# Patient Record
Sex: Female | Born: 1945
Health system: Southern US, Community
[De-identification: ages and names within clinical notes are randomized; demographics above are authoritative.]

## PROBLEM LIST (undated history)

## (undated) DIAGNOSIS — D573 Sickle-cell trait: Secondary | ICD-10-CM

## (undated) DIAGNOSIS — I10 Essential (primary) hypertension: Secondary | ICD-10-CM

## (undated) DIAGNOSIS — N183 Chronic kidney disease, stage 3 unspecified: Secondary | ICD-10-CM

## (undated) DIAGNOSIS — K5090 Crohn's disease, unspecified, without complications: Secondary | ICD-10-CM

## (undated) DIAGNOSIS — B20 Human immunodeficiency virus [HIV] disease: Secondary | ICD-10-CM

## (undated) DIAGNOSIS — Z21 Asymptomatic human immunodeficiency virus [HIV] infection status: Secondary | ICD-10-CM

## (undated) HISTORY — DX: Chronic kidney disease, stage 3 unspecified: N18.30

## (undated) HISTORY — DX: Human immunodeficiency virus (HIV) disease: B20

## (undated) HISTORY — DX: Crohn's disease, unspecified, without complications: K50.90

## (undated) HISTORY — DX: Essential (primary) hypertension: I10

## (undated) HISTORY — PX: COLON SURGERY: SHX602

## (undated) HISTORY — DX: Sickle-cell trait: D57.3

## (undated) HISTORY — DX: Asymptomatic human immunodeficiency virus (hiv) infection status: Z21

## (undated) HISTORY — DX: Chronic kidney disease, stage 3 (moderate): N18.3

---

## 1997-07-12 ENCOUNTER — Encounter: Payer: Self-pay | Admitting: Gastroenterology

## 1997-12-21 ENCOUNTER — Encounter: Admission: RE | Admit: 1997-12-21 | Discharge: 1997-12-21 | Payer: Self-pay | Admitting: Infectious Diseases

## 1997-12-21 ENCOUNTER — Encounter (INDEPENDENT_AMBULATORY_CARE_PROVIDER_SITE_OTHER): Payer: Self-pay | Admitting: *Deleted

## 1998-01-04 ENCOUNTER — Encounter: Admission: RE | Admit: 1998-01-04 | Discharge: 1998-01-04 | Payer: Self-pay | Admitting: Infectious Diseases

## 1998-02-08 ENCOUNTER — Encounter: Admission: RE | Admit: 1998-02-08 | Discharge: 1998-02-08 | Payer: Self-pay | Admitting: Obstetrics and Gynecology

## 1998-02-22 ENCOUNTER — Encounter: Admission: RE | Admit: 1998-02-22 | Discharge: 1998-02-22 | Payer: Self-pay | Admitting: Internal Medicine

## 1998-03-25 ENCOUNTER — Encounter: Admission: RE | Admit: 1998-03-25 | Discharge: 1998-03-25 | Payer: Self-pay | Admitting: Infectious Diseases

## 1998-04-25 ENCOUNTER — Encounter: Admission: RE | Admit: 1998-04-25 | Discharge: 1998-04-25 | Payer: Self-pay | Admitting: Infectious Diseases

## 1998-06-26 ENCOUNTER — Encounter: Admission: RE | Admit: 1998-06-26 | Discharge: 1998-06-26 | Payer: Self-pay | Admitting: Infectious Diseases

## 1998-07-12 ENCOUNTER — Encounter: Admission: RE | Admit: 1998-07-12 | Discharge: 1998-07-12 | Payer: Self-pay | Admitting: Infectious Diseases

## 1998-09-26 ENCOUNTER — Ambulatory Visit (HOSPITAL_COMMUNITY): Admission: RE | Admit: 1998-09-26 | Discharge: 1998-09-26 | Payer: Self-pay | Admitting: Infectious Diseases

## 1998-10-10 ENCOUNTER — Other Ambulatory Visit: Admission: RE | Admit: 1998-10-10 | Discharge: 1998-10-10 | Payer: Self-pay | Admitting: Infectious Diseases

## 1998-10-10 ENCOUNTER — Encounter: Admission: RE | Admit: 1998-10-10 | Discharge: 1998-10-10 | Payer: Self-pay | Admitting: Infectious Diseases

## 1998-11-04 ENCOUNTER — Other Ambulatory Visit: Admission: RE | Admit: 1998-11-04 | Discharge: 1998-11-04 | Payer: Self-pay | Admitting: *Deleted

## 1998-12-12 ENCOUNTER — Ambulatory Visit (HOSPITAL_COMMUNITY): Admission: RE | Admit: 1998-12-12 | Discharge: 1998-12-12 | Payer: Self-pay | Admitting: *Deleted

## 1999-01-09 ENCOUNTER — Encounter: Admission: RE | Admit: 1999-01-09 | Discharge: 1999-01-09 | Payer: Self-pay | Admitting: Infectious Diseases

## 1999-01-09 ENCOUNTER — Ambulatory Visit (HOSPITAL_COMMUNITY): Admission: RE | Admit: 1999-01-09 | Discharge: 1999-01-09 | Payer: Self-pay | Admitting: Infectious Diseases

## 1999-01-23 ENCOUNTER — Encounter: Admission: RE | Admit: 1999-01-23 | Discharge: 1999-01-23 | Payer: Self-pay | Admitting: Infectious Diseases

## 1999-02-04 ENCOUNTER — Ambulatory Visit: Admission: RE | Admit: 1999-02-04 | Discharge: 1999-02-04 | Payer: Self-pay | Admitting: *Deleted

## 1999-04-23 ENCOUNTER — Encounter: Admission: RE | Admit: 1999-04-23 | Discharge: 1999-04-23 | Payer: Self-pay | Admitting: Infectious Diseases

## 1999-04-23 ENCOUNTER — Ambulatory Visit (HOSPITAL_COMMUNITY): Admission: RE | Admit: 1999-04-23 | Discharge: 1999-04-23 | Payer: Self-pay | Admitting: Infectious Diseases

## 1999-05-08 ENCOUNTER — Encounter: Admission: RE | Admit: 1999-05-08 | Discharge: 1999-05-08 | Payer: Self-pay | Admitting: Infectious Diseases

## 1999-07-17 ENCOUNTER — Encounter: Admission: RE | Admit: 1999-07-17 | Discharge: 1999-07-17 | Payer: Self-pay | Admitting: Infectious Diseases

## 1999-07-17 ENCOUNTER — Ambulatory Visit (HOSPITAL_COMMUNITY): Admission: RE | Admit: 1999-07-17 | Discharge: 1999-07-17 | Payer: Self-pay | Admitting: Infectious Diseases

## 1999-07-31 ENCOUNTER — Encounter: Admission: RE | Admit: 1999-07-31 | Discharge: 1999-07-31 | Payer: Self-pay | Admitting: Infectious Diseases

## 1999-10-20 ENCOUNTER — Ambulatory Visit (HOSPITAL_COMMUNITY): Admission: RE | Admit: 1999-10-20 | Discharge: 1999-10-20 | Payer: Self-pay | Admitting: Infectious Diseases

## 1999-10-20 ENCOUNTER — Encounter: Admission: RE | Admit: 1999-10-20 | Discharge: 1999-10-20 | Payer: Self-pay | Admitting: Infectious Diseases

## 1999-11-06 ENCOUNTER — Encounter: Admission: RE | Admit: 1999-11-06 | Discharge: 1999-11-06 | Payer: Self-pay | Admitting: Infectious Diseases

## 2000-01-22 ENCOUNTER — Ambulatory Visit (HOSPITAL_COMMUNITY): Admission: RE | Admit: 2000-01-22 | Discharge: 2000-01-22 | Payer: Self-pay | Admitting: Infectious Diseases

## 2000-01-22 ENCOUNTER — Encounter: Admission: RE | Admit: 2000-01-22 | Discharge: 2000-01-22 | Payer: Self-pay | Admitting: Infectious Diseases

## 2000-02-05 ENCOUNTER — Encounter: Admission: RE | Admit: 2000-02-05 | Discharge: 2000-02-05 | Payer: Self-pay | Admitting: Infectious Diseases

## 2000-04-21 ENCOUNTER — Encounter: Admission: RE | Admit: 2000-04-21 | Discharge: 2000-04-21 | Payer: Self-pay | Admitting: Infectious Diseases

## 2000-04-21 ENCOUNTER — Ambulatory Visit (HOSPITAL_COMMUNITY): Admission: RE | Admit: 2000-04-21 | Discharge: 2000-04-21 | Payer: Self-pay | Admitting: Infectious Diseases

## 2000-05-06 ENCOUNTER — Encounter: Admission: RE | Admit: 2000-05-06 | Discharge: 2000-05-06 | Payer: Self-pay | Admitting: Infectious Diseases

## 2000-09-09 ENCOUNTER — Encounter: Admission: RE | Admit: 2000-09-09 | Discharge: 2000-09-09 | Payer: Self-pay | Admitting: Infectious Diseases

## 2000-09-09 ENCOUNTER — Ambulatory Visit (HOSPITAL_COMMUNITY): Admission: RE | Admit: 2000-09-09 | Discharge: 2000-09-09 | Payer: Self-pay | Admitting: Infectious Diseases

## 2000-09-23 ENCOUNTER — Encounter: Admission: RE | Admit: 2000-09-23 | Discharge: 2000-09-23 | Payer: Self-pay | Admitting: Infectious Diseases

## 2000-10-08 ENCOUNTER — Encounter: Admission: RE | Admit: 2000-10-08 | Discharge: 2000-10-08 | Payer: Self-pay

## 2001-01-20 ENCOUNTER — Ambulatory Visit (HOSPITAL_COMMUNITY): Admission: RE | Admit: 2001-01-20 | Discharge: 2001-01-20 | Payer: Self-pay | Admitting: Infectious Diseases

## 2001-01-20 ENCOUNTER — Encounter: Admission: RE | Admit: 2001-01-20 | Discharge: 2001-01-20 | Payer: Self-pay | Admitting: Infectious Diseases

## 2001-02-10 ENCOUNTER — Encounter: Admission: RE | Admit: 2001-02-10 | Discharge: 2001-02-10 | Payer: Self-pay | Admitting: Infectious Diseases

## 2001-04-12 ENCOUNTER — Ambulatory Visit (HOSPITAL_COMMUNITY): Admission: RE | Admit: 2001-04-12 | Discharge: 2001-04-12 | Payer: Self-pay | Admitting: Internal Medicine

## 2001-04-12 ENCOUNTER — Encounter: Admission: RE | Admit: 2001-04-12 | Discharge: 2001-04-12 | Payer: Self-pay | Admitting: Infectious Diseases

## 2001-05-03 ENCOUNTER — Other Ambulatory Visit: Admission: RE | Admit: 2001-05-03 | Discharge: 2001-05-03 | Payer: Self-pay | Admitting: Internal Medicine

## 2001-05-05 ENCOUNTER — Encounter: Admission: RE | Admit: 2001-05-05 | Discharge: 2001-05-05 | Payer: Self-pay | Admitting: Infectious Diseases

## 2001-08-24 ENCOUNTER — Encounter: Admission: RE | Admit: 2001-08-24 | Discharge: 2001-08-24 | Payer: Self-pay | Admitting: Internal Medicine

## 2001-08-24 ENCOUNTER — Ambulatory Visit (HOSPITAL_COMMUNITY): Admission: RE | Admit: 2001-08-24 | Discharge: 2001-08-24 | Payer: Self-pay | Admitting: Infectious Diseases

## 2001-09-08 ENCOUNTER — Encounter: Admission: RE | Admit: 2001-09-08 | Discharge: 2001-09-08 | Payer: Self-pay | Admitting: Infectious Diseases

## 2001-10-25 ENCOUNTER — Encounter (INDEPENDENT_AMBULATORY_CARE_PROVIDER_SITE_OTHER): Payer: Self-pay | Admitting: *Deleted

## 2001-10-25 ENCOUNTER — Other Ambulatory Visit: Admission: RE | Admit: 2001-10-25 | Discharge: 2001-10-25 | Payer: Self-pay | Admitting: Obstetrics and Gynecology

## 2001-10-25 LAB — CONVERTED CEMR LAB

## 2001-11-22 ENCOUNTER — Ambulatory Visit: Admission: RE | Admit: 2001-11-22 | Discharge: 2001-11-22 | Payer: Self-pay | Admitting: Gynecologic Oncology

## 2002-01-16 ENCOUNTER — Ambulatory Visit (HOSPITAL_COMMUNITY): Admission: RE | Admit: 2002-01-16 | Discharge: 2002-01-16 | Payer: Self-pay | Admitting: Infectious Diseases

## 2002-01-16 ENCOUNTER — Encounter: Admission: RE | Admit: 2002-01-16 | Discharge: 2002-01-16 | Payer: Self-pay | Admitting: Infectious Diseases

## 2002-01-23 ENCOUNTER — Other Ambulatory Visit: Admission: RE | Admit: 2002-01-23 | Discharge: 2002-01-23 | Payer: Self-pay | Admitting: Obstetrics and Gynecology

## 2002-01-23 ENCOUNTER — Encounter (INDEPENDENT_AMBULATORY_CARE_PROVIDER_SITE_OTHER): Payer: Self-pay | Admitting: *Deleted

## 2002-01-23 LAB — CONVERTED CEMR LAB

## 2002-02-02 ENCOUNTER — Encounter: Admission: RE | Admit: 2002-02-02 | Discharge: 2002-02-02 | Payer: Self-pay | Admitting: Infectious Diseases

## 2002-03-03 ENCOUNTER — Encounter: Admission: RE | Admit: 2002-03-03 | Discharge: 2002-03-03 | Payer: Self-pay | Admitting: Infectious Diseases

## 2002-04-14 ENCOUNTER — Ambulatory Visit (HOSPITAL_COMMUNITY): Admission: RE | Admit: 2002-04-14 | Discharge: 2002-04-14 | Payer: Self-pay | Admitting: Infectious Diseases

## 2002-04-14 ENCOUNTER — Encounter: Admission: RE | Admit: 2002-04-14 | Discharge: 2002-04-14 | Payer: Self-pay | Admitting: Infectious Diseases

## 2002-05-04 ENCOUNTER — Encounter: Admission: RE | Admit: 2002-05-04 | Discharge: 2002-05-04 | Payer: Self-pay | Admitting: Infectious Diseases

## 2002-07-03 ENCOUNTER — Encounter: Admission: RE | Admit: 2002-07-03 | Discharge: 2002-07-03 | Payer: Self-pay | Admitting: Infectious Diseases

## 2002-07-06 ENCOUNTER — Encounter: Admission: RE | Admit: 2002-07-06 | Discharge: 2002-07-06 | Payer: Self-pay | Admitting: Infectious Diseases

## 2002-07-06 ENCOUNTER — Ambulatory Visit (HOSPITAL_COMMUNITY): Admission: RE | Admit: 2002-07-06 | Discharge: 2002-07-06 | Payer: Self-pay | Admitting: Infectious Diseases

## 2002-07-27 ENCOUNTER — Encounter: Admission: RE | Admit: 2002-07-27 | Discharge: 2002-07-27 | Payer: Self-pay | Admitting: Infectious Diseases

## 2002-07-31 ENCOUNTER — Other Ambulatory Visit: Admission: RE | Admit: 2002-07-31 | Discharge: 2002-07-31 | Payer: Self-pay | Admitting: Obstetrics and Gynecology

## 2002-07-31 ENCOUNTER — Encounter (INDEPENDENT_AMBULATORY_CARE_PROVIDER_SITE_OTHER): Payer: Self-pay | Admitting: *Deleted

## 2002-07-31 LAB — CONVERTED CEMR LAB

## 2002-10-19 ENCOUNTER — Encounter: Payer: Self-pay | Admitting: Infectious Diseases

## 2002-10-19 ENCOUNTER — Encounter: Admission: RE | Admit: 2002-10-19 | Discharge: 2002-10-19 | Payer: Self-pay | Admitting: Infectious Diseases

## 2002-11-02 ENCOUNTER — Encounter: Admission: RE | Admit: 2002-11-02 | Discharge: 2002-11-02 | Payer: Self-pay | Admitting: Infectious Diseases

## 2002-11-16 ENCOUNTER — Encounter: Payer: Self-pay | Admitting: Infectious Diseases

## 2002-11-16 ENCOUNTER — Encounter: Admission: RE | Admit: 2002-11-16 | Discharge: 2002-11-16 | Payer: Self-pay | Admitting: Infectious Diseases

## 2003-02-12 ENCOUNTER — Ambulatory Visit (HOSPITAL_COMMUNITY): Admission: RE | Admit: 2003-02-12 | Discharge: 2003-02-12 | Payer: Self-pay | Admitting: Infectious Diseases

## 2003-02-12 ENCOUNTER — Encounter: Payer: Self-pay | Admitting: Infectious Diseases

## 2003-02-12 ENCOUNTER — Encounter: Admission: RE | Admit: 2003-02-12 | Discharge: 2003-02-12 | Payer: Self-pay | Admitting: Infectious Diseases

## 2003-02-13 ENCOUNTER — Other Ambulatory Visit: Admission: RE | Admit: 2003-02-13 | Discharge: 2003-02-13 | Payer: Self-pay | Admitting: Physician Assistant

## 2003-02-13 ENCOUNTER — Encounter (INDEPENDENT_AMBULATORY_CARE_PROVIDER_SITE_OTHER): Payer: Self-pay | Admitting: *Deleted

## 2003-02-13 ENCOUNTER — Other Ambulatory Visit: Admission: RE | Admit: 2003-02-13 | Discharge: 2003-02-13 | Payer: Self-pay | Admitting: Obstetrics and Gynecology

## 2003-02-13 LAB — CONVERTED CEMR LAB: Pap Smear: UNDETERMINED

## 2003-03-01 ENCOUNTER — Encounter: Admission: RE | Admit: 2003-03-01 | Discharge: 2003-03-01 | Payer: Self-pay | Admitting: Infectious Diseases

## 2003-05-04 ENCOUNTER — Encounter: Admission: RE | Admit: 2003-05-04 | Discharge: 2003-05-04 | Payer: Self-pay | Admitting: Internal Medicine

## 2003-06-07 ENCOUNTER — Ambulatory Visit (HOSPITAL_COMMUNITY): Admission: RE | Admit: 2003-06-07 | Discharge: 2003-06-07 | Payer: Self-pay | Admitting: Infectious Diseases

## 2003-06-07 ENCOUNTER — Encounter: Payer: Self-pay | Admitting: Infectious Diseases

## 2003-06-07 ENCOUNTER — Encounter: Admission: RE | Admit: 2003-06-07 | Discharge: 2003-06-07 | Payer: Self-pay | Admitting: Infectious Diseases

## 2003-06-21 ENCOUNTER — Encounter: Admission: RE | Admit: 2003-06-21 | Discharge: 2003-06-21 | Payer: Self-pay | Admitting: Infectious Diseases

## 2003-08-27 ENCOUNTER — Other Ambulatory Visit: Admission: RE | Admit: 2003-08-27 | Discharge: 2003-08-27 | Payer: Self-pay | Admitting: Obstetrics and Gynecology

## 2003-08-27 ENCOUNTER — Encounter (INDEPENDENT_AMBULATORY_CARE_PROVIDER_SITE_OTHER): Payer: Self-pay | Admitting: *Deleted

## 2003-08-27 LAB — CONVERTED CEMR LAB: Pap Smear: NEGATIVE

## 2003-10-02 ENCOUNTER — Encounter: Admission: RE | Admit: 2003-10-02 | Discharge: 2003-10-02 | Payer: Self-pay | Admitting: Infectious Diseases

## 2003-10-02 ENCOUNTER — Ambulatory Visit (HOSPITAL_COMMUNITY): Admission: RE | Admit: 2003-10-02 | Discharge: 2003-10-02 | Payer: Self-pay | Admitting: Infectious Diseases

## 2003-10-16 ENCOUNTER — Encounter: Admission: RE | Admit: 2003-10-16 | Discharge: 2003-10-16 | Payer: Self-pay | Admitting: Infectious Diseases

## 2004-01-03 ENCOUNTER — Ambulatory Visit (HOSPITAL_COMMUNITY): Admission: RE | Admit: 2004-01-03 | Discharge: 2004-01-03 | Payer: Self-pay | Admitting: Infectious Diseases

## 2004-01-03 ENCOUNTER — Encounter: Admission: RE | Admit: 2004-01-03 | Discharge: 2004-01-03 | Payer: Self-pay | Admitting: Infectious Diseases

## 2004-01-17 ENCOUNTER — Encounter: Admission: RE | Admit: 2004-01-17 | Discharge: 2004-01-17 | Payer: Self-pay | Admitting: Infectious Diseases

## 2004-02-12 ENCOUNTER — Other Ambulatory Visit: Admission: RE | Admit: 2004-02-12 | Discharge: 2004-02-12 | Payer: Self-pay | Admitting: Obstetrics and Gynecology

## 2004-02-12 ENCOUNTER — Encounter (INDEPENDENT_AMBULATORY_CARE_PROVIDER_SITE_OTHER): Payer: Self-pay | Admitting: *Deleted

## 2004-04-03 ENCOUNTER — Ambulatory Visit (HOSPITAL_COMMUNITY): Admission: RE | Admit: 2004-04-03 | Discharge: 2004-04-03 | Payer: Self-pay | Admitting: Infectious Diseases

## 2004-04-03 ENCOUNTER — Ambulatory Visit: Payer: Self-pay | Admitting: Infectious Diseases

## 2004-04-17 ENCOUNTER — Ambulatory Visit: Payer: Self-pay | Admitting: Infectious Diseases

## 2004-07-17 ENCOUNTER — Ambulatory Visit: Payer: Self-pay | Admitting: Infectious Diseases

## 2004-07-17 ENCOUNTER — Ambulatory Visit (HOSPITAL_COMMUNITY): Admission: RE | Admit: 2004-07-17 | Discharge: 2004-07-17 | Payer: Self-pay | Admitting: Infectious Diseases

## 2004-07-31 ENCOUNTER — Ambulatory Visit: Payer: Self-pay | Admitting: Infectious Diseases

## 2004-08-06 ENCOUNTER — Emergency Department (HOSPITAL_COMMUNITY): Admission: EM | Admit: 2004-08-06 | Discharge: 2004-08-06 | Payer: Self-pay | Admitting: Emergency Medicine

## 2004-08-14 ENCOUNTER — Other Ambulatory Visit: Admission: RE | Admit: 2004-08-14 | Discharge: 2004-08-14 | Payer: Self-pay | Admitting: Obstetrics and Gynecology

## 2004-08-14 ENCOUNTER — Encounter (INDEPENDENT_AMBULATORY_CARE_PROVIDER_SITE_OTHER): Payer: Self-pay | Admitting: *Deleted

## 2004-10-13 ENCOUNTER — Ambulatory Visit: Payer: Self-pay | Admitting: Gastroenterology

## 2004-10-21 ENCOUNTER — Encounter (INDEPENDENT_AMBULATORY_CARE_PROVIDER_SITE_OTHER): Payer: Self-pay | Admitting: *Deleted

## 2004-10-21 ENCOUNTER — Ambulatory Visit: Payer: Self-pay | Admitting: Gastroenterology

## 2004-11-11 ENCOUNTER — Ambulatory Visit: Payer: Self-pay | Admitting: Infectious Diseases

## 2004-11-11 ENCOUNTER — Ambulatory Visit (HOSPITAL_COMMUNITY): Admission: RE | Admit: 2004-11-11 | Discharge: 2004-11-11 | Payer: Self-pay | Admitting: Infectious Diseases

## 2004-12-04 ENCOUNTER — Ambulatory Visit: Payer: Self-pay | Admitting: Infectious Diseases

## 2005-02-19 ENCOUNTER — Encounter (INDEPENDENT_AMBULATORY_CARE_PROVIDER_SITE_OTHER): Payer: Self-pay | Admitting: *Deleted

## 2005-02-19 ENCOUNTER — Other Ambulatory Visit: Admission: RE | Admit: 2005-02-19 | Discharge: 2005-02-19 | Payer: Self-pay | Admitting: Obstetrics and Gynecology

## 2005-02-19 LAB — CONVERTED CEMR LAB: Pap Smear: NEGATIVE

## 2005-03-26 ENCOUNTER — Ambulatory Visit (HOSPITAL_COMMUNITY): Admission: RE | Admit: 2005-03-26 | Discharge: 2005-03-26 | Payer: Self-pay | Admitting: Infectious Diseases

## 2005-03-26 ENCOUNTER — Ambulatory Visit: Payer: Self-pay | Admitting: Infectious Diseases

## 2005-04-09 ENCOUNTER — Ambulatory Visit: Payer: Self-pay | Admitting: Infectious Diseases

## 2005-07-01 ENCOUNTER — Ambulatory Visit: Payer: Self-pay | Admitting: Infectious Diseases

## 2005-07-01 ENCOUNTER — Encounter (INDEPENDENT_AMBULATORY_CARE_PROVIDER_SITE_OTHER): Payer: Self-pay | Admitting: *Deleted

## 2005-07-01 ENCOUNTER — Ambulatory Visit (HOSPITAL_COMMUNITY): Admission: RE | Admit: 2005-07-01 | Discharge: 2005-07-01 | Payer: Self-pay | Admitting: Infectious Diseases

## 2005-07-16 ENCOUNTER — Ambulatory Visit: Payer: Self-pay | Admitting: Infectious Diseases

## 2005-08-18 ENCOUNTER — Other Ambulatory Visit: Admission: RE | Admit: 2005-08-18 | Discharge: 2005-08-18 | Payer: Self-pay | Admitting: Obstetrics and Gynecology

## 2005-08-18 ENCOUNTER — Encounter (INDEPENDENT_AMBULATORY_CARE_PROVIDER_SITE_OTHER): Payer: Self-pay | Admitting: *Deleted

## 2005-12-03 ENCOUNTER — Ambulatory Visit: Payer: Self-pay | Admitting: Infectious Diseases

## 2005-12-03 ENCOUNTER — Encounter (INDEPENDENT_AMBULATORY_CARE_PROVIDER_SITE_OTHER): Payer: Self-pay | Admitting: *Deleted

## 2005-12-03 ENCOUNTER — Encounter: Admission: RE | Admit: 2005-12-03 | Discharge: 2005-12-03 | Payer: Self-pay | Admitting: Infectious Diseases

## 2005-12-03 LAB — CONVERTED CEMR LAB
CD4 Count: 970 microliters
HIV 1 RNA Quant: 399 copies/mL

## 2005-12-17 ENCOUNTER — Ambulatory Visit: Payer: Self-pay | Admitting: Infectious Diseases

## 2006-03-03 ENCOUNTER — Other Ambulatory Visit: Admission: RE | Admit: 2006-03-03 | Discharge: 2006-03-03 | Payer: Self-pay | Admitting: Obstetrics and Gynecology

## 2006-03-03 ENCOUNTER — Encounter (INDEPENDENT_AMBULATORY_CARE_PROVIDER_SITE_OTHER): Payer: Self-pay | Admitting: *Deleted

## 2006-04-09 ENCOUNTER — Encounter (INDEPENDENT_AMBULATORY_CARE_PROVIDER_SITE_OTHER): Payer: Self-pay | Admitting: *Deleted

## 2006-04-09 ENCOUNTER — Encounter: Admission: RE | Admit: 2006-04-09 | Discharge: 2006-04-09 | Payer: Self-pay | Admitting: Infectious Diseases

## 2006-04-09 ENCOUNTER — Ambulatory Visit: Payer: Self-pay | Admitting: Infectious Diseases

## 2006-04-09 LAB — CONVERTED CEMR LAB
ALT: 61 units/L — ABNORMAL HIGH (ref 0–40)
BUN: 16 mg/dL (ref 6–23)
Basophils Absolute: 0 10*3/uL (ref 0.0–0.1)
CD4 Count: 1420 microliters
CO2: 22 meq/L (ref 19–32)
Calcium: 8.9 mg/dL (ref 8.4–10.5)
Chloride: 106 meq/L (ref 96–112)
Creatinine, Ser: 0.98 mg/dL (ref 0.40–1.20)
Eosinophils Relative: 1 % (ref 0–5)
Glucose, Bld: 88 mg/dL (ref 70–99)
HCT: 39 % (ref 36.0–46.0)
HIV 1 RNA Quant: 268 copies/mL — ABNORMAL HIGH (ref <50)
HIV-1 RNA Quant, Log: 2.43 — ABNORMAL HIGH (ref <1.70)
Hemoglobin: 12.5 g/dL (ref 12.0–15.0)
Lymphocytes Relative: 32 % (ref 12–46)
Lymphs Abs: 3.8 10*3/uL — ABNORMAL HIGH (ref 0.7–3.3)
Monocytes Absolute: 1 10*3/uL — ABNORMAL HIGH (ref 0.2–0.7)
Monocytes Relative: 9 % (ref 3–11)
Neutro Abs: 6.9 10*3/uL (ref 1.7–7.7)
RBC: 4.39 M/uL (ref 3.87–5.11)
Total Bilirubin: 0.3 mg/dL (ref 0.3–1.2)

## 2006-04-23 ENCOUNTER — Ambulatory Visit: Payer: Self-pay | Admitting: Infectious Diseases

## 2006-06-18 ENCOUNTER — Ambulatory Visit: Payer: Self-pay | Admitting: Gastroenterology

## 2006-07-22 DIAGNOSIS — N870 Mild cervical dysplasia: Secondary | ICD-10-CM | POA: Insufficient documentation

## 2006-07-22 DIAGNOSIS — M810 Age-related osteoporosis without current pathological fracture: Secondary | ICD-10-CM | POA: Insufficient documentation

## 2006-07-22 DIAGNOSIS — B20 Human immunodeficiency virus [HIV] disease: Secondary | ICD-10-CM | POA: Insufficient documentation

## 2006-07-22 DIAGNOSIS — B1710 Acute hepatitis C without hepatic coma: Secondary | ICD-10-CM | POA: Insufficient documentation

## 2006-07-22 DIAGNOSIS — N893 Dysplasia of vagina, unspecified: Secondary | ICD-10-CM | POA: Insufficient documentation

## 2006-07-22 DIAGNOSIS — D638 Anemia in other chronic diseases classified elsewhere: Secondary | ICD-10-CM | POA: Insufficient documentation

## 2006-08-18 ENCOUNTER — Other Ambulatory Visit: Admission: RE | Admit: 2006-08-18 | Discharge: 2006-08-18 | Payer: Self-pay | Admitting: Obstetrics and Gynecology

## 2006-08-18 ENCOUNTER — Encounter (INDEPENDENT_AMBULATORY_CARE_PROVIDER_SITE_OTHER): Payer: Self-pay | Admitting: *Deleted

## 2006-08-18 LAB — CONVERTED CEMR LAB

## 2006-08-23 ENCOUNTER — Encounter (INDEPENDENT_AMBULATORY_CARE_PROVIDER_SITE_OTHER): Payer: Self-pay | Admitting: *Deleted

## 2006-08-23 LAB — CONVERTED CEMR LAB

## 2006-08-25 ENCOUNTER — Encounter: Payer: Self-pay | Admitting: Infectious Diseases

## 2006-09-05 ENCOUNTER — Encounter (INDEPENDENT_AMBULATORY_CARE_PROVIDER_SITE_OTHER): Payer: Self-pay | Admitting: *Deleted

## 2006-10-06 ENCOUNTER — Encounter: Payer: Self-pay | Admitting: Infectious Diseases

## 2006-10-13 ENCOUNTER — Ambulatory Visit: Admission: RE | Admit: 2006-10-13 | Discharge: 2006-10-13 | Payer: Self-pay | Admitting: Gynecologic Oncology

## 2006-10-13 ENCOUNTER — Encounter: Payer: Self-pay | Admitting: Infectious Diseases

## 2006-10-27 ENCOUNTER — Encounter: Admission: RE | Admit: 2006-10-27 | Discharge: 2006-10-27 | Payer: Self-pay | Admitting: Infectious Diseases

## 2006-10-27 ENCOUNTER — Ambulatory Visit: Payer: Self-pay | Admitting: Infectious Diseases

## 2006-11-02 ENCOUNTER — Ambulatory Visit: Payer: Self-pay | Admitting: Infectious Diseases

## 2006-11-02 LAB — CONVERTED CEMR LAB: Vitamin B-12: 228 pg/mL (ref 211–911)

## 2006-11-18 ENCOUNTER — Telehealth: Payer: Self-pay | Admitting: Infectious Diseases

## 2007-02-22 ENCOUNTER — Other Ambulatory Visit: Admission: RE | Admit: 2007-02-22 | Discharge: 2007-02-22 | Payer: Self-pay | Admitting: Obstetrics and Gynecology

## 2007-02-22 ENCOUNTER — Encounter (INDEPENDENT_AMBULATORY_CARE_PROVIDER_SITE_OTHER): Payer: Self-pay | Admitting: *Deleted

## 2007-02-22 ENCOUNTER — Encounter: Payer: Self-pay | Admitting: Infectious Diseases

## 2007-02-22 LAB — CONVERTED CEMR LAB: Pap Smear: ABNORMAL

## 2007-03-14 ENCOUNTER — Encounter (INDEPENDENT_AMBULATORY_CARE_PROVIDER_SITE_OTHER): Payer: Self-pay | Admitting: *Deleted

## 2007-04-01 ENCOUNTER — Encounter: Admission: RE | Admit: 2007-04-01 | Discharge: 2007-04-01 | Payer: Self-pay | Admitting: Infectious Diseases

## 2007-04-01 ENCOUNTER — Ambulatory Visit: Payer: Self-pay | Admitting: Infectious Diseases

## 2007-04-01 LAB — CONVERTED CEMR LAB
ALT: 52 units/L — ABNORMAL HIGH (ref 0–35)
AST: 45 units/L — ABNORMAL HIGH (ref 0–37)
Alkaline Phosphatase: 132 units/L — ABNORMAL HIGH (ref 39–117)
BUN: 13 mg/dL (ref 6–23)
Basophils Absolute: 0 10*3/uL (ref 0.0–0.1)
Basophils Relative: 0 % (ref 0–1)
Chloride: 107 meq/L (ref 96–112)
Creatinine, Ser: 1.13 mg/dL (ref 0.40–1.20)
Eosinophils Absolute: 0.1 10*3/uL (ref 0.0–0.7)
MCHC: 32.7 g/dL (ref 30.0–36.0)
MCV: 89 fL (ref 78.0–100.0)
Monocytes Absolute: 0.9 10*3/uL — ABNORMAL HIGH (ref 0.2–0.7)
Monocytes Relative: 8 % (ref 3–11)
Neutro Abs: 7.2 10*3/uL (ref 1.7–7.7)
Neutrophils Relative %: 64 % (ref 43–77)
Potassium: 4.2 meq/L (ref 3.5–5.3)
RBC: 4.36 M/uL (ref 3.87–5.11)
RDW: 13.3 % (ref 11.5–14.0)

## 2007-04-14 ENCOUNTER — Ambulatory Visit: Payer: Self-pay | Admitting: Infectious Diseases

## 2007-06-09 ENCOUNTER — Encounter (INDEPENDENT_AMBULATORY_CARE_PROVIDER_SITE_OTHER): Payer: Self-pay | Admitting: *Deleted

## 2007-08-22 ENCOUNTER — Encounter (INDEPENDENT_AMBULATORY_CARE_PROVIDER_SITE_OTHER): Payer: Self-pay | Admitting: *Deleted

## 2007-08-22 ENCOUNTER — Other Ambulatory Visit: Admission: RE | Admit: 2007-08-22 | Discharge: 2007-08-22 | Payer: Self-pay | Admitting: Obstetrics and Gynecology

## 2007-08-22 LAB — CONVERTED CEMR LAB

## 2007-08-25 ENCOUNTER — Telehealth (INDEPENDENT_AMBULATORY_CARE_PROVIDER_SITE_OTHER): Payer: Self-pay | Admitting: *Deleted

## 2007-10-24 ENCOUNTER — Ambulatory Visit: Payer: Self-pay | Admitting: Internal Medicine

## 2007-11-10 ENCOUNTER — Encounter (INDEPENDENT_AMBULATORY_CARE_PROVIDER_SITE_OTHER): Payer: Self-pay | Admitting: *Deleted

## 2007-11-23 ENCOUNTER — Encounter: Payer: Self-pay | Admitting: Infectious Diseases

## 2007-11-23 ENCOUNTER — Ambulatory Visit: Payer: Self-pay | Admitting: Internal Medicine

## 2007-11-23 ENCOUNTER — Encounter: Admission: RE | Admit: 2007-11-23 | Discharge: 2007-11-23 | Payer: Self-pay | Admitting: Internal Medicine

## 2007-11-23 LAB — CONVERTED CEMR LAB
ALT: 73 units/L — ABNORMAL HIGH (ref 0–35)
Basophils Absolute: 0 10*3/uL (ref 0.0–0.1)
CO2: 20 meq/L (ref 19–32)
Cholesterol: 124 mg/dL (ref 0–200)
Eosinophils Relative: 1 % (ref 0–5)
HCT: 37 % (ref 36.0–46.0)
HIV 1 RNA Quant: 249 copies/mL — ABNORMAL HIGH (ref <50)
HIV-1 RNA Quant, Log: 2.4 — ABNORMAL HIGH (ref <1.70)
LDL Cholesterol: 39 mg/dL (ref 0–99)
Lymphocytes Relative: 35 % (ref 12–46)
Neutro Abs: 6 10*3/uL (ref 1.7–7.7)
Neutrophils Relative %: 56 % (ref 43–77)
Platelets: 268 10*3/uL (ref 150–400)
Potassium: 3.9 meq/L (ref 3.5–5.3)
RDW: 12.8 % (ref 11.5–15.5)
Sodium: 137 meq/L (ref 135–145)
Total Bilirubin: 0.4 mg/dL (ref 0.3–1.2)
Total Protein: 8.1 g/dL (ref 6.0–8.3)
VLDL: 37 mg/dL (ref 0–40)

## 2007-12-05 ENCOUNTER — Ambulatory Visit: Payer: Self-pay | Admitting: Infectious Diseases

## 2008-02-29 ENCOUNTER — Encounter (INDEPENDENT_AMBULATORY_CARE_PROVIDER_SITE_OTHER): Payer: Self-pay | Admitting: *Deleted

## 2008-02-29 ENCOUNTER — Encounter: Payer: Self-pay | Admitting: Infectious Diseases

## 2008-02-29 ENCOUNTER — Other Ambulatory Visit: Admission: RE | Admit: 2008-02-29 | Discharge: 2008-02-29 | Payer: Self-pay | Admitting: Obstetrics and Gynecology

## 2008-02-29 LAB — CONVERTED CEMR LAB

## 2008-04-05 ENCOUNTER — Ambulatory Visit: Payer: Self-pay | Admitting: Infectious Diseases

## 2008-04-05 LAB — CONVERTED CEMR LAB
ALT: 76 units/L — ABNORMAL HIGH (ref 0–35)
AST: 93 units/L — ABNORMAL HIGH (ref 0–37)
Albumin: 4 g/dL (ref 3.5–5.2)
Basophils Absolute: 0.1 10*3/uL (ref 0.0–0.1)
Calcium: 9.1 mg/dL (ref 8.4–10.5)
Chloride: 100 meq/L (ref 96–112)
Lymphocytes Relative: 27 % (ref 12–46)
Neutro Abs: 9 10*3/uL — ABNORMAL HIGH (ref 1.7–7.7)
Platelets: 452 10*3/uL — ABNORMAL HIGH (ref 150–400)
Potassium: 3.9 meq/L (ref 3.5–5.3)
RDW: 13.1 % (ref 11.5–15.5)

## 2008-07-10 ENCOUNTER — Telehealth (INDEPENDENT_AMBULATORY_CARE_PROVIDER_SITE_OTHER): Payer: Self-pay | Admitting: *Deleted

## 2008-08-21 ENCOUNTER — Ambulatory Visit: Payer: Self-pay | Admitting: Infectious Diseases

## 2008-08-21 ENCOUNTER — Encounter (INDEPENDENT_AMBULATORY_CARE_PROVIDER_SITE_OTHER): Payer: Self-pay | Admitting: Licensed Clinical Social Worker

## 2008-08-21 LAB — CONVERTED CEMR LAB
Alkaline Phosphatase: 128 units/L — ABNORMAL HIGH (ref 39–117)
Creatinine, Ser: 1.14 mg/dL (ref 0.40–1.20)
Eosinophils Absolute: 0.1 10*3/uL (ref 0.0–0.7)
Eosinophils Relative: 1 % (ref 0–5)
Glucose, Bld: 124 mg/dL — ABNORMAL HIGH (ref 70–99)
HCT: 36.6 % (ref 36.0–46.0)
HIV-1 RNA Quant, Log: <1.68 (ref <1.68)
Lymphs Abs: 2.5 10*3/uL (ref 0.7–4.0)
MCV: 90.8 fL (ref 78.0–100.0)
Monocytes Absolute: 0.9 10*3/uL (ref 0.1–1.0)
Platelets: 247 10*3/uL (ref 150–400)
RDW: 12.6 % (ref 11.5–15.5)
Sodium: 138 meq/L (ref 135–145)
Total Bilirubin: 0.3 mg/dL (ref 0.3–1.2)
Total Protein: 7.6 g/dL (ref 6.0–8.3)
WBC: 8 10*3/uL (ref 4.0–10.5)

## 2008-09-06 ENCOUNTER — Ambulatory Visit: Payer: Self-pay | Admitting: Infectious Diseases

## 2008-09-19 ENCOUNTER — Encounter (INDEPENDENT_AMBULATORY_CARE_PROVIDER_SITE_OTHER): Payer: Self-pay | Admitting: *Deleted

## 2009-02-27 ENCOUNTER — Ambulatory Visit: Payer: Self-pay | Admitting: Infectious Diseases

## 2009-02-27 LAB — CONVERTED CEMR LAB
ALT: 65 units/L — ABNORMAL HIGH (ref 0–35)
Alkaline Phosphatase: 107 units/L (ref 39–117)
Basophils Relative: 0 % (ref 0–1)
Creatinine, Ser: 1.04 mg/dL (ref 0.40–1.20)
Eosinophils Absolute: 0.1 10*3/uL (ref 0.0–0.7)
Eosinophils Relative: 1 % (ref 0–5)
HCT: 37.2 % (ref 36.0–46.0)
HDL: 53 mg/dL (ref >39)
HIV 1 RNA Quant: 483 copies/mL — ABNORMAL HIGH (ref <48)
LDL Cholesterol: 42 mg/dL (ref 0–99)
MCHC: 31.7 g/dL (ref 30.0–36.0)
MCV: 91.2 fL (ref >=78.0)
Monocytes Relative: 6 % (ref 3–12)
Neutrophils Relative %: 62 % (ref 43–77)
Platelets: 255 10*3/uL (ref 150–400)
Sodium: 141 meq/L (ref 135–145)
Total Bilirubin: 0.4 mg/dL (ref 0.3–1.2)
Total CHOL/HDL Ratio: 2.4
Total Protein: 8.3 g/dL (ref 6.0–8.3)

## 2009-03-14 ENCOUNTER — Ambulatory Visit: Payer: Self-pay | Admitting: Infectious Diseases

## 2009-03-15 ENCOUNTER — Telehealth (INDEPENDENT_AMBULATORY_CARE_PROVIDER_SITE_OTHER): Payer: Self-pay | Admitting: *Deleted

## 2009-06-03 ENCOUNTER — Other Ambulatory Visit: Admission: RE | Admit: 2009-06-03 | Discharge: 2009-06-03 | Payer: Self-pay | Admitting: Obstetrics and Gynecology

## 2009-08-16 ENCOUNTER — Ambulatory Visit: Payer: Self-pay | Admitting: Infectious Diseases

## 2009-08-16 LAB — CONVERTED CEMR LAB
ALT: 49 units/L — ABNORMAL HIGH (ref 0–35)
AST: 42 units/L — ABNORMAL HIGH (ref 0–37)
Alkaline Phosphatase: 100 units/L (ref 39–117)
Basophils Absolute: 0 10*3/uL (ref 0.0–0.1)
Basophils Relative: 0 % (ref 0–1)
Creatinine, Ser: 1.05 mg/dL (ref 0.40–1.20)
Eosinophils Absolute: 0.2 10*3/uL (ref 0.0–0.7)
HIV 1 RNA Quant: <48 copies/mL (ref <48)
MCHC: 31.7 g/dL (ref 30.0–36.0)
MCV: 91.1 fL (ref >=78.0)
Monocytes Relative: 10 % (ref 3–12)
Neutro Abs: 5.1 10*3/uL (ref 1.7–7.7)
Neutrophils Relative %: 54 % (ref 43–77)
Platelets: 254 10*3/uL (ref 150–400)
RBC: 4.05 M/uL (ref 3.87–5.11)
Sodium: 136 meq/L (ref 135–145)
Total Bilirubin: 0.3 mg/dL (ref 0.3–1.2)
Total Protein: 8.1 g/dL (ref 6.0–8.3)

## 2009-08-29 ENCOUNTER — Ambulatory Visit: Payer: Self-pay | Admitting: Infectious Diseases

## 2010-01-29 ENCOUNTER — Telehealth: Payer: Self-pay | Admitting: Internal Medicine

## 2010-03-04 ENCOUNTER — Ambulatory Visit: Payer: Self-pay | Admitting: Infectious Diseases

## 2010-03-04 LAB — CONVERTED CEMR LAB
Albumin: 4 g/dL (ref 3.5–5.2)
Alkaline Phosphatase: 96 units/L (ref 39–117)
BUN: 16 mg/dL (ref 6–23)
Calcium: 9.3 mg/dL (ref 8.4–10.5)
Chloride: 102 meq/L (ref 96–112)
Creatinine, Ser: 1.07 mg/dL (ref 0.40–1.20)
Eosinophils Absolute: 0.2 10*3/uL (ref 0.0–0.7)
Glucose, Bld: 112 mg/dL — ABNORMAL HIGH (ref 70–99)
HDL: 43 mg/dL (ref >39)
Hemoglobin: 12 g/dL (ref 12.0–15.0)
Lymphs Abs: 3.8 10*3/uL (ref 0.7–4.0)
MCHC: 32.7 g/dL (ref 30.0–36.0)
MCV: 90.2 fL (ref 78.0–100.0)
Monocytes Absolute: 1 10*3/uL (ref 0.1–1.0)
Monocytes Relative: 8 % (ref 3–12)
Neutrophils Relative %: 60 % (ref 43–77)
Potassium: 4 meq/L (ref 3.5–5.3)
RBC: 4.07 M/uL (ref 3.87–5.11)
Total CHOL/HDL Ratio: 2.5
Triglycerides: 282 mg/dL — ABNORMAL HIGH (ref <150)
WBC: 12.2 10*3/uL — ABNORMAL HIGH (ref 4.0–10.5)

## 2010-03-20 ENCOUNTER — Ambulatory Visit: Payer: Self-pay | Admitting: Infectious Diseases

## 2010-03-25 ENCOUNTER — Ambulatory Visit: Payer: Self-pay | Admitting: Internal Medicine

## 2010-03-25 DIAGNOSIS — K5000 Crohn's disease of small intestine without complications: Secondary | ICD-10-CM | POA: Insufficient documentation

## 2010-05-13 ENCOUNTER — Encounter: Payer: Self-pay | Admitting: Infectious Diseases

## 2010-05-13 ENCOUNTER — Encounter (INDEPENDENT_AMBULATORY_CARE_PROVIDER_SITE_OTHER): Payer: Self-pay | Admitting: *Deleted

## 2010-05-29 ENCOUNTER — Ambulatory Visit: Payer: Self-pay | Admitting: Internal Medicine

## 2010-07-27 LAB — CONVERTED CEMR LAB
AST: 39 units/L — ABNORMAL HIGH (ref 0–37)
Albumin: 4.2 g/dL (ref 3.5–5.2)
BUN: 20 mg/dL (ref 6–23)
Basophils Relative: 0 % (ref 0–1)
Bilirubin Urine: NEGATIVE
Calcium: 8.8 mg/dL (ref 8.4–10.5)
Chloride: 106 meq/L (ref 96–112)
Cholesterol: 106 mg/dL (ref 0–200)
Creatinine, Ser: 1 mg/dL (ref 0.40–1.20)
Glucose, Bld: 94 mg/dL (ref 70–99)
HDL: 51 mg/dL (ref >39)
HIV 1 RNA Quant: 113 copies/mL — ABNORMAL HIGH (ref <50)
HIV-1 RNA Quant, Log: 2.05 — ABNORMAL HIGH (ref <1.70)
Hemoglobin, Urine: NEGATIVE
Hemoglobin: 12.4 g/dL (ref 12.0–15.0)
Ketones, ur: NEGATIVE mg/dL
Lymphs Abs: 2.9 10*3/uL (ref 0.7–3.3)
MCHC: 32 g/dL (ref 30.0–36.0)
MCV: 90.9 fL (ref 78.0–100.0)
Monocytes Absolute: 0.7 10*3/uL (ref 0.2–0.7)
Monocytes Relative: 7 % (ref 3–11)
Neutro Abs: 5.9 10*3/uL (ref 1.7–7.7)
Nitrite: NEGATIVE
Potassium: 3.8 meq/L (ref 3.5–5.3)
RBC / HPF: NONE SEEN (ref <3)
RBC: 4.27 M/uL (ref 3.87–5.11)
Specific Gravity, Urine: 1.017 (ref 1.005–1.03)
Total CHOL/HDL Ratio: 2.1
Triglycerides: 168 mg/dL — ABNORMAL HIGH (ref <150)
Urine Glucose: NEGATIVE mg/dL
WBC: 9.7 10*3/uL (ref 4.0–10.5)
pH: 6 (ref 5.0–8.0)

## 2010-07-31 NOTE — Letter (Signed)
Summary: [facility] Instructions  [facility]  [place], [place] [address]   Phone: [phone]  Fax: [phone]       Christine Dixon    1946/01/25    MRN: [id number]        Procedure Day /Date: Tuesday November 1st, 2011     Arrival Time: 1:00pm     Procedure Time: 2:00pm     Location of Procedure:                    _ x_  [place] (4th Floor)                        [place]   Starting 5 days prior to your procedure10/28/11 do not eat nuts, seeds, popcorn, corn, beans, peas,  salads, or any raw vegetables.  Do not take any fiber supplements (e.g. Metamucil, Citrucel, and Benefiber).  THE DAY BEFORE YOUR PROCEDURE         DATE: 04/28/10  DAY: Monday  1.  Drink clear liquids the entire day-NO SOLID FOOD  2.  Do not drink anything colored red or purple.  Avoid juices with pulp.  No orange juice.  3.  Drink at least 64 oz. (8 glasses) of fluid/clear liquids during the day to prevent dehydration and help the prep work efficiently.  CLEAR LIQUIDS INCLUDE: Water Jello Ice Popsicles Tea (sugar ok, no milk/cream) Powdered fruit flavored drinks Coffee (sugar ok, no milk/cream) Gatorade Juice: apple, white grape, white cranberry  Lemonade Clear bullion, consomm, broth Carbonated beverages (any kind) Strained chicken noodle soup Hard Candy                             4.  In the morning, mix first dose of MoviPrep solution:    Empty 1 Pouch A and 1 Pouch B into the disposable container    Add lukewarm drinking water to the top line of the container. Mix to dissolve    Refrigerate (mixed solution should be used within 24 hrs)  5.  Begin drinking the prep at 5:00 p.m. The MoviPrep container is divided by 4 marks.   Every 15 minutes drink the solution down to the next mark (approximately 8 oz) until the full liter is complete.   6.  Follow completed prep with 16 oz of clear liquid of your choice  (Nothing red or purple).  Continue to drink clear liquids until bedtime.  7.  Before going to bed, mix second dose of MoviPrep solution:    Empty 1 Pouch A and 1 Pouch B into the disposable container    Add lukewarm drinking water to the top line of the container. Mix to dissolve    Refrigerate  THE DAY OF YOUR PROCEDURE      DATE: 04/29/10 DAY: Tuesday  Beginning at 10:00 a.m. (5 hours before procedure):         1. Every 15 minutes, drink the solution down to the next mark (approx 8 oz) until the full liter is complete.  2. Follow completed prep with 16 oz. of clear liquid of your choice.    3. You may drink clear liquids until 12:00 (2 HOURS BEFORE PROCEDURE).   MEDICATION INSTRUCTIONS  Unless otherwise instructed, you should take regular prescription medications with a small sip of water   as early as possible the morning of your procedure.  OTHER INSTRUCTIONS  You will need a responsible adult at least 66 years of age to accompany you and drive you home.   This person must remain in the waiting room during your procedure.  Wear loose fitting clothing that is easily removed.  Leave jewelry and other valuables at home.  However, you may wish to bring a book to read or  an iPod/MP3 player to listen to music as you wait for your procedure to start.  Remove all body piercing jewelry and leave at home.  Total time from sign-in until discharge is approximately 2-3 hours.  You should go home directly after your procedure and rest.  You can resume normal activities the  day after your procedure.  The day of your procedure you should not:   Drive   Make legal decisions   Operate machinery   Drink alcohol   Return to work  You will receive specific instructions about eating, activities and medications before you leave.    The above instructions have been reviewed and explained to me by   Estill Bamberg.     I fully understand and can verbalize these  instructions _____________________________ Date _________

## 2010-07-31 NOTE — Procedures (Signed)
Summary: Melina Copa, MD   Colonoscopy  Procedure date:  10/21/2004  Findings:      Location:  [place].  Pathology:  Adenomatous polyp.          Procedures Next Due Date:    Colonoscopy: 10/2008 Patient Name: Christine Dixon, Christine Dixon MRN:  Procedure Procedures: Colonoscopy CPT: 28003.    with multiple biopsies. CPT: [phone].  Personnel: Endoscopist: Clarene Reamer, MD.  Exam Location: Exam performed in Outpatient Clinic. Outpatient  Patient Consent: Procedure, Alternatives, Risks and Benefits discussed, consent obtained, from patient. Consent was obtained by the RN.  Indications  Surveillance of: Crohn's Disease.  History  Current Medications: Patient is not currently taking Coumadin.  Pre-Exam Physical: Entire physical exam was normal.  Exam Exam: Extent of exam reached: Anastamosis Site, extent intended: Anastamosis Site.  Colon retroflexion performed. Images were not taken. ASA Classification: II. Tolerance: excellent.  Monitoring: Pulse and BP monitoring, Oximetry used. Supplemental O2 given.  Colon Prep Prep results: good.  Sedation Meds: Patient assessed and found to be appropriate for moderate (conscious) sedation. Fentanyl 100 mcg. given IV. Versed 5 mg. given IV.  Findings - NOT SEEN ON EXAM: Ascending Colon to Rectum. Polyps, AVM's, Colitis, Tumors, Melanosis, Crohn's, Diverticulosis, Comments: mhultiple bx,s obtained.   Assessment Abnormal examination, see findings above.  Events  Unplanned Interventions: No intervention was required.  Unplanned Events: There were no complications. Plans Medication Plan: Continue current medications.  Patient Education: Patient given standard instructions for: Crohn's. Yearly hemoccult testing recommended. Patient instructed to get routine colonoscopy every 4 years.  Disposition: After procedure patient sent to recovery. After recovery patient sent home.  This report was  created from the original endoscopy report, which was reviewed and signed by the above listed endoscopist.    cc: Eulis Canner, MD

## 2010-07-31 NOTE — Miscellaneous (Signed)
  Clinical Lists Changes  Observations: Added new observation of YEARAIDSPOS: 1999  (05/13/2010 15:28) Added new observation of HIV STATUS: CDC-defined AIDS  (05/13/2010 15:28)

## 2010-07-31 NOTE — Procedures (Signed)
Summary: EGD/[facility]  EGD/[facility]   Imported By: Phillis Knack 03/26/2010 08:16:55  _____________________________________________________________________  External Attachment:    Type:   Image     Comment:   External Document

## 2010-07-31 NOTE — Progress Notes (Signed)
Summary: Courtesy call/appt. needed   ---- Converted from flag ---- ---- 01/29/2010 9:47 AM, Irene Shipper MD wrote: okay to refill her med. Tell her that she needs to schedule her followup colonoscopy. Convert this information into a phone note so that we have a permanent record of your correspondence  ---- 01/29/2010 8:28 AM, Randye Lobo NCMA wrote: Christine Dixon has not been seen since 2009 and was due for her colonoscopy in 2010, which she did not have.  I received a request for refill of her Asacol.  I can call her and advise her to come in to see you and schedule Colonoscopy. Do you want me to refill this or wait? ------------------------------  Phone Note Outgoing Call   Call placed by: Randye Lobo NCMA,  January 29, 2010 9:48 AM Call placed to: Patient Summary of Call: Called patient and made an appt. for her to come see Dr. Henrene Pastor 03/25/10 1:30 pm.  I will call in her Asacol enough to last until then.  Initial call taken by: Randye Lobo NCMA,  January 29, 2010 9:52 AM

## 2010-07-31 NOTE — Assessment & Plan Note (Signed)
Summary: Crohn's. Last seen 4-09. Refill Asacol, schedule colonoscopy    History of Present Illness Visit Type: Follow-up Visit Primary GI MD: Scarlette Shorts MD Primary Provider: [name], MD Chief Complaint: Refill on medications to f/u Crohn's diease and discuss recall colonoscopy.  History of Present Illness:   65 year old [place] American female with a history of Crohn's disease involving the small intestine for which she underwent ileocecectomy remotely in [place]. Also history of hepatitis C, and HIV disease with nondetectable viral load on medical therapy. Previously followed by Dr. Velora Heckler. Seen by myself on one occasion to establish as a new patient April 2009. Maintained on Asacol 800 mg t.i.d. for her disease. Last colonoscopy in 2006 revealed no abnormalities. Random biopsies were taken and said to show benign colonic mucosa as well as a Fragment Of Adenoma. At the time of her last visit she was to continue on Asacol and undergo followup colonoscopy in 2010. She has not had that exam. She was asked to make an office appointment after calling for medication refill. Her GI review of systems is entirely negative. She does complain of generalized anxiety. She sees Dr. Eulis Canner regarding her HIV and general medical care. Review of outside laboratories from March 04, 2010 reveals normal CBC except for white blood cell count of 12.2. Normal comprehensive metabolic panel except for glucose of 112, SGOT 42, and SGPT 51.   GI Review of Systems      Denies abdominal pain, acid reflux, belching, bloating, chest pain, dysphagia with liquids, dysphagia with solids, heartburn, loss of appetite, nausea, vomiting, vomiting blood, weight loss, and  weight gain.        Denies anal fissure, black tarry stools, change in bowel habit, constipation, diarrhea, diverticulosis, fecal incontinence, heme positive stool, hemorrhoids, irritable bowel syndrome, jaundice, light color stool, liver problems, rectal  bleeding, and  rectal pain. Preventive Screening-Counseling & Management  Alcohol-Tobacco     Smoking Status: quit      Drug Use:  no.      Current Medications (verified): 1)  Atripla 600-200-300 Mg Tabs (Efavirenz-Emtricitab-Tenofovir) .... Take 1 Tablet By Mouth At Bedtime 2)  Altace 5 Mg Caps (Ramipril) .... Take 1 Capsule By Mouth Once A Day 3)  Calcium .... Take 1 Tablet By Mouth Twice A Day 4)  Asacol 400 Mg Tbec (Mesalamine) .... 2 Tablets By Mouth Three Times A Day  Allergies (verified): 1)  ! Codeine 2)  ! Diona Fanti  Past History:  Past Medical History: Reviewed history from 03/24/2010 and no changes required. Ileal Crohn's Disease Hepatitis C HIV disease Osteoporosis Anemia of chronic disease B12 deficiency anemia Tubular adenoma  09/2004 Anxiety Disorder Depression Hiatal Hernia Nonerosive esophagitis  Past Surgical History: Reviewed history from 03/24/2010 and no changes required. Ileocecectomy Appendectomy Tonsillectomy  Family History: Family History of Diabetes:   Social History: Married, 2 boys, 1 girl Patient is a former smoker.  Quit 20 years ago Alcohol Use - no Daily Caffeine Use--tea Illicit Drug Use - no Drug Use:  no  Review of Systems  The patient denies allergy/sinus, anemia, anxiety-new, arthritis/joint pain, back pain, blood in urine, breast changes/lumps, change in vision, confusion, cough, coughing up blood, depression-new, fainting, fatigue, fever, headaches-new, hearing problems, heart murmur, heart rhythm changes, itching, menstrual pain, muscle pains/cramps, night sweats, nosebleeds, pregnancy symptoms, shortness of breath, skin rash, sleeping problems, sore throat, swelling of feet/legs, swollen lymph glands, thirst - excessive , urination - excessive , urination changes/pain, urine leakage, vision changes, and voice change.  Vital Signs:  Patient profile:   65 year old female Menstrual status:  postmenopausal Height:      60  inches Weight:      93 pounds BMI:     18.23 Pulse rate:   96 / minute Pulse rhythm:   regular BP sitting:   124 / 72  (left arm) Cuff size:   regular  Vitals Entered By: Marlon Pel CMA Deborra Medina) (March 25, 2010 1:35 PM)  Physical Exam  General:  Well developed, well nourished, no acute distress. Head:  Normocephalic and atraumatic. Eyes:  PERRLA, no icterus. Ears:  Normal auditory acuity. Mouth:  No deformity or lesions. Neck:  Supple; no masses or thyromegaly. Lungs:  Clear throughout to auscultation. Heart:  Regular rate and rhythm; no murmurs, rubs,  or bruits. Abdomen:  Soft, nontender and nondistended. No masses, hepatosplenomegaly or hernias noted. Normal bowel sounds. Prior surgical incision well healed. Rectal:  deferred until colonoscopy Msk:  Symmetrical with no gross deformities. Normal posture. Pulses:  Normal pulses noted. Extremities:  No clubbing, cyanosis, edema or deformities noted. Neurologic:  Alert and  oriented x4. Skin:  Intact without significant lesions or rashes. Psych:  Alert and cooperative. Normal mood and affect.   Impression & Recommendations:  Problem # 1:  CROHN'S DISEASE, SMALL INTESTINE (ICD-555.0) history of Crohn's disease for which she has undergone ileocecectomy remotely. Presumably small bowel Crohn's, the may have had an element of chronic disease. On chronic Asacol with questionable benefit. Colonoscopy in 2006 as described. Question a fragment of adenoma of uncertain significance. Clinically stable without active symptoms or signs.  Plan: #1. Continue Asacol for now (prescription refilled) #2. Scheduled colonoscopy. The nature of the procedure as well as the risks, benefits, and alternatives have been reviewed. She understood and agreed to proceed #3. Movi prep prescribed. The patient instructed on its use  Other Orders: Colonoscopy (Colon)  Patient Instructions: 1)  Colonoscopy and Flexible Sigmoidoscopy brochure given.    2)  Pick up your prep from your pharmacy.  3)  Copy sent to : [name], MD 4)  The medication list was reviewed and reconciled.  All changed / newly prescribed medications were explained.  A complete medication list was provided to the patient / caregiver. Prescriptions: MOVIPREP 100 GM  SOLR (PEG-KCL-NACL-NASULF-NA ASC-C) As per prep instructions.  #1 x 0   Entered by:   Marlon Pel CMA ([place])   Authorized by:   Irene Shipper MD   Signed by:   Marlon Pel CMA ([place]) on 03/25/2010   Method used:   Electronically to        [facility]. [phone]* (retail)       [address]       [place], [place]  [address]       Ph: [phone]       Fax: [phone]   RxID:   [id number]

## 2010-07-31 NOTE — Procedures (Signed)
Summary: Colonoscopy  Patient: Christine Dixon Note: All result statuses are Final unless otherwise noted.  Tests: (1) Colonoscopy (COL)   COL Colonoscopy           [facility]     [place], [place]  [address]           COLONOSCOPY PROCEDURE REPORT           PATIENT:  [name], [name]  MR#:  [id number]     BIRTHDATE:  1945/07/02, 27 yrs. old  GENDER:  female     ENDOSCOPIST:  Docia Chuck. Geri Seminole, MD     REF. BY:  Surveillance Program Recall,     PROCEDURE DATE:  05/29/2010     PROCEDURE:  Surveillance Colonoscopy     ASA CLASS:  Class III     INDICATIONS:  High risk screening; Crohn's disease ; last colon     2006 - random bx showed one "fragment of adenoma", prior surgery     in [place]     MEDICATIONS:   Fentanyl 50 mcg IV, Versed 8 mg IV           DESCRIPTION OF PROCEDURE:   After the risks benefits and     alternatives of the procedure were thoroughly explained, informed     consent was obtained.  Digital rectal exam was performed and     revealed no abnormalities.   The LB [id number] endoscope     was introduced through the anus and advanced to the anastomosis,     without limitations.  The quality of the prep was excellent, using     MoviPrep.Time to anastomosis= 8:00 min.  The instrument was then     slowly withdrawn (time = 12:03 min) as the colon was fully     examined.     <<PROCEDUREIMAGES>>           FINDINGS:  The neo-terminal ileum appeared normal x 25cm.  There     was a normal ileo-colonic anastomosis.  Remainder of colon normal.     Retroflexed views in the rectum revealed no abnormalities.    The     scope was then withdrawn from the patient and the procedure     completed.           COMPLICATIONS:  None     ENDOSCOPIC IMPRESSION:     1) Normal terminal ileum     2) Normal anastomosis, ileo-colonic     3) Normal remainder of the colon without active Crohn's           RECOMMENDATIONS:     1) Follow up colonoscopy in 5  years           ______________________________     Docia Chuck. Geri Seminole, MD           CC:  [name], MD; The Patient           n.     eSIGNED:   Docia Chuck. Geri Seminole at 05/29/2010 11:36 AM           Lorelei Pont, [id number]  Note: An exclamation mark (!) indicates a result that was not dispersed into the flowsheet. Document Creation Date: 05/29/2010 11:37 AM _______________________________________________________________________  (1) Order result status: Final Collection or observation date-time: 05/29/2010 11:27 Requested date-time:  Receipt date-time:  Reported date-time:  Referring Physician:   Ordering Physician: Lavena Bullion [phone]) Specimen Source:  Source: Tawanna Cooler Order Number: [id number] Lab site:   Appended Document: Colonoscopy    Clinical Lists Changes  Observations: Added new observation of COLONNXTDUE: 05/2015 (05/29/2010 12:30)

## 2010-07-31 NOTE — Assessment & Plan Note (Signed)
Summary: fukam   CC:  follow-up visit, seen by EMS yesterday - felt like her hear was racing, then started to slow down, and Hypertension Management.  History of Present Illness: Christine Dixon is doing well from standpoint of her chronic viral infectiosns, HCV and HIV. She is on Atripl and is HIV suppressed wtih CD4 count of 1200. She is adherent and has felt very well and energetic in her retirement. Yesterday she had an episode that was classic for panic attack with dyspnea, tachycardia and sense of doom. She was evaluated by EMS in her home and quickly responded to their intervventions of normal ECG, normal glucose and vital signs other thatn slight tachycardia. She is now completely well and said she was under family stress but didn't want to discuss this with me. I counseled her on some medication and breathing techniques if this is repetitive.       Her LFTs are nearly normal and if so in three to four more months at f/u then will repeat quantitative HCV PCR. Informed her that this year the FDA will be approving two antiHCV viral inhibitors that will be more tolerable than current therapy and to stay tuned. Informed here that it will probably be another year before we know if we can consign interferon to the back shelf.  Hypertension History:      Positive major cardiovascular risk factors include female age 67 years old or older.  Negative major cardiovascular risk factors include non-tobacco-user status.     Preventive Screening-Counseling & Management  Alcohol-Tobacco     Alcohol drinks/day: 0     Smoking Status: quit     Year Quit: 1990  Caffeine-Diet-Exercise     Caffeine use/day: yes     Does Patient Exercise: no  Hep-HIV-STD-Contraception     HIV Risk: no risk noted  Safety-Violence-Falls     Seat Belt Use: yes      Sexual History:  n/a.        Drug Use:  never.     Current Allergies (reviewed today): ! CODEINE ! ASA Social History: Sexual History:  n/a  Vital  Signs:  Patient profile:   65 year old female Menstrual status:  postmenopausal Height:      60 inches (152.40 cm) Weight:      94.7 pounds (43.05 kg) BMI:     18.56 Temp:     98.1 degrees F (36.72 degrees C) oral Pulse rate:   105 / minute BP sitting:   153 / 79  (left arm) Cuff size:   regular  Vitals Entered By: Lorne Skeens RN (August 29, 2009 3:06 PM) CC: follow-up visit, seen by EMS yesterday - felt like her hear was racing, then started to slow down, Hypertension Management Is Patient Diabetic? No Pain Assessment Patient in pain? yes     Location: upper body Intensity: 3 Type: aching Onset of pain  started yester Nutritional Status BMI of < 19 = underweight Nutritional Status Detail appetite "good"  Have you ever been in a relationship where you felt threatened, hurt or afraid?No   Does patient need assistance? Functional Status Self care Ambulation Normal Comments I haven't taken my medicine today.   Physical Exam  General:  alert, well-developed, well-nourished, and well-hydrated.   Eyes:  vision grossly intact, pupils equal, and pupils round.   Mouth:  good dentition.   Neck:  supple.   Lungs:  normal respiratory effort.     Impression & Recommendations:  Problem # 1:  HIV DISEASE (ICD-042)  Orders: Est. Patient Level III (99213)Future Orders: T-CBC w/Diff ([id number]) ... 12/27/2009 T-CD4SP ([facility]) (CD4SP) ... 12/27/2009 T-Comprehensive Metabolic Panel ([id number]) ... 12/27/2009 T-HIV Viral Load [id number]) ... 12/27/2009 T-RPR (Syphilis) [phone]) ... 12/27/2009 Suppressed and doing well.  Problem # 2:  HEPATITIS C (ICD-070.51) Assessment: Improved stab;e and best LFTS in years: improved.  Other Orders: T-Vitamin D (25-Hydroxy) [phone]) Future Orders: T-Lipid Profile [phone]) ... 12/27/2009  Hypertension Assessment/Plan:      The patient's hypertensive risk group is category B: At least one risk factor (excluding  diabetes) with no target organ damage.  Her calculated 10 year risk of coronary heart disease is 8 %.  Today's blood pressure is 153/79.    Patient Instructions: 1)  Please schedule a follow-up appointment in 4-5 months. 2)  Be sure to return for lab work one (1) week before your next appointment as scheduled. Process Orders Check Orders Results:     [facility]: MCR not required for this insurance Tests Sent for requisitioning (August 29, 2009 5:39 PM):     12/27/2009: [facility] -- T-CBC w/Diff [[id number]] (signed)     12/27/2009: [facility] -- T-Comprehensive Metabolic Panel [[id number]] (signed)     12/27/2009: [facility] -- T-HIV Viral Load [phone] (signed)     12/27/2009: [facility] -- T-RPR (Syphilis) [phone] (signed)     12/27/2009: [facility] -- T-Lipid Profile [phone] (signed)     08/29/2009: [facility] -- T-Vitamin D (25-Hydroxy) [phone] (signed)

## 2010-09-04 NOTE — Assessment & Plan Note (Signed)
Summary: 4monthf/u   CC:  6 month follow up.  Preventive Screening-Counseling & Management  Alcohol-Tobacco     Alcohol drinks/day: 0     Smoking Status: quit     Year Quit: 1990     Pack years: 1/2  Caffeine-Diet-Exercise     Caffeine use/day: tea     Does Patient Exercise: no  Safety-Violence-Falls     Seat Belt Use: yes   Current Allergies (reviewed today): ! CODEINE ! ASA Vital Signs:  Patient profile:   65year old female Menstrual status:  postmenopausal Height:      60 inches (152.40 cm) Weight:      95.12 pounds (43.24 kg) BMI:     18.64 Temp:     98.6 degrees F (37.00 degrees C) oral Pulse rate:   128 / minute BP sitting:   194 / 100  (right arm)  Vitals Entered By: PRocky Morel (March 20, 2010 1:56 PM) CC: 6 month follow up Pain Assessment Patient in pain? no      Nutritional Status BMI of < 19 = underweight Nutritional Status Detail appetite is great per patient  Have you ever been in a relationship where you felt threatened, hurt or afraid?No   Does patient need assistance? Functional Status Self care Ambulation Normal    Other Orders: Est. Patient Level III ((87215 Future Orders: T-CBC w/Diff (([id number] ... 08/20/2010 T-Comprehensive Metabolic Panel ([id number] ... 08/20/2010 T-CD4SP ([facility]) (CD4SP) ... 08/20/2010 T-HIV Viral Load ([phone] ... 08/20/2010  Patient Instructions: 1)  Please schedule a follow-up appointment in 4-6 months.  2)  Be sure to return for lab work one (1) week before your next appointment as scheduled.  Process Orders Check Orders Results:     [facility]: Check successful Tests Sent for requisitioning (August 26, 2010 10:36 AM):     08/20/2010: [facility] -- T-CBC w/Diff [[[id number]](signed)     08/20/2010: [facility] -- T-Comprehensive Metabolic Panel [[[id number]](signed)     08/20/2010: [facility] -- T-HIV Viral  Load [[id number](signed)

## 2010-09-04 NOTE — Procedures (Signed)
Summary: Colonoscopy  Patient: Christine Dixon Note: All result statuses are Final unless otherwise noted.  Tests: (1) Colonoscopy (COL)   COL Colonoscopy           [facility]     [place], [place]  [address]           COLONOSCOPY PROCEDURE REPORT           PATIENT:  [name], [name]  MR#:  [id number]     BIRTHDATE:  17-Jun-1946, 57 yrs. old  GENDER:  female     ENDOSCOPIST:  Docia Chuck. Geri Seminole, MD     REF. BY:  Surveillance Program Recall,     PROCEDURE DATE:  05/29/2010     PROCEDURE:  Surveillance Colonoscopy     ASA CLASS:  Class III     INDICATIONS:  High risk screening; Crohn's disease ; last colon     2006 - random bx showed one "fragment of adenoma", [place]     [place]     MEDICATIONS:   Fentanyl 50 mcg IV, Versed 8 mg IV           DESCRIPTION OF PROCEDURE:   After the risks benefits and     alternatives of the procedure were thoroughly explained, informed     consent was obtained.  Digital rectal exam was performed and     revealed no abnormalities.   The LB PCF-[id number] endoscope     was introduced through the anus and advanced to the anastomosis,     without limitations.  The quality of the prep was excellent, using     MoviPrep.Time to anastomosis= 8:00 min.  The instrument was then     slowly withdrawn (time = 12:03 min) as the colon was fully     examined.     <<PROCEDUREIMAGES>>           FINDINGS:  The neo-terminal ileum appeared normal x 25cm.  There     was a normal ileo-colonic anastomosis.  Remainder of colon normal.     Retroflexed views in the rectum revealed no abnormalities.    The     scope was then withdrawn from the patient and the procedure     completed.           COMPLICATIONS:  None     ENDOSCOPIC IMPRESSION:     1) Normal terminal ileum     2) Normal anastomosis, ileo-colonic     3) Normal remainder of the colon without active Crohn's           RECOMMENDATIONS:     1) Follow up colonoscopy in 5  years           ______________________________     Docia Chuck. Geri Seminole, MD           CC:  [name], MD; The Patient           n.     eSIGNED:   Docia Chuck. Geri Seminole at 05/29/2010 11:36 AM           Lorelei Pont, [id number]  Note: An exclamation mark (!) indicates a result that was not dispersed into the flowsheet. Document Creation Date: 05/29/2010 11:37 AM _______________________________________________________________________  (1) Order result status: Final Collection or observation date-time: 05/29/2010 11:27 Requested date-time:  Receipt date-time:  Reported date-time:  Referring Physician:   Ordering Physician: Lavena Bullion [phone]) Specimen Source:  Source: Tawanna Cooler Order Number: [id number] Lab site:   Appended Document: Colonoscopy    Clinical Lists Changes  Observations: Added new observation of COLONNXTDUE: 05/2015 (05/29/2010 12:30)

## 2010-09-11 LAB — T-HELPER CELL (CD4) - (RCID CLINIC ONLY): CD4 T Cell Abs: 1200 uL (ref 400–2700)

## 2010-09-19 LAB — T-HELPER CELL (CD4) - (RCID CLINIC ONLY)
CD4 % Helper T Cell: 41 % (ref 33–55)
CD4 T Cell Abs: 1220 uL (ref 400–2700)

## 2010-10-02 ENCOUNTER — Other Ambulatory Visit (INDEPENDENT_AMBULATORY_CARE_PROVIDER_SITE_OTHER): Payer: Medicare Other

## 2010-10-02 DIAGNOSIS — B20 Human immunodeficiency virus [HIV] disease: Secondary | ICD-10-CM

## 2010-10-02 LAB — CBC WITH DIFFERENTIAL/PLATELET
Eosinophils Absolute: 0.1 10*3/uL (ref 0.0–0.7)
Eosinophils Relative: 1 % (ref 0–5)
HCT: 35.6 % — ABNORMAL LOW (ref 36.0–46.0)
Lymphocytes Relative: 30 % (ref 12–46)
Lymphs Abs: 3 10*3/uL (ref 0.7–4.0)
MCH: 29 pg (ref 26.0–34.0)
MCV: 91.3 fL (ref 78.0–100.0)
Monocytes Absolute: 0.9 10*3/uL (ref 0.1–1.0)
Monocytes Relative: 9 % (ref 3–12)
RBC: 3.9 MIL/uL (ref 3.87–5.11)
WBC: 9.8 10*3/uL (ref 4.0–10.5)

## 2010-10-03 LAB — COMPLETE METABOLIC PANEL WITH GFR
ALT: 54 U/L — ABNORMAL HIGH (ref 0–35)
AST: 45 U/L — ABNORMAL HIGH (ref 0–37)
Alkaline Phosphatase: 94 U/L (ref 39–117)
BUN: 18 mg/dL (ref 6–23)
Chloride: 104 mEq/L (ref 96–112)
Creat: 1.18 mg/dL (ref 0.40–1.20)
Potassium: 4.1 mEq/L (ref 3.5–5.3)

## 2010-10-03 LAB — T-HELPER CELL (CD4) - (RCID CLINIC ONLY)
CD4 % Helper T Cell: 42 % (ref 33–55)
CD4 T Cell Abs: 1070 uL (ref 400–2700)

## 2010-10-04 LAB — HIV-1 RNA QUANT-NO REFLEX-BLD
HIV 1 RNA Quant: 37 copies/mL — ABNORMAL HIGH (ref <20)
HIV-1 RNA Quant, Log: 1.57 {Log} — ABNORMAL HIGH (ref <1.30)

## 2010-10-14 LAB — T-HELPER CELL (CD4) - (RCID CLINIC ONLY)
CD4 % Helper T Cell: 44 % (ref 33–55)
CD4 T Cell Abs: 930 uL (ref 400–2700)

## 2010-10-16 ENCOUNTER — Ambulatory Visit (INDEPENDENT_AMBULATORY_CARE_PROVIDER_SITE_OTHER): Payer: Medicare Other | Admitting: Infectious Diseases

## 2010-10-16 ENCOUNTER — Encounter: Payer: Self-pay | Admitting: Infectious Diseases

## 2010-10-16 DIAGNOSIS — B20 Human immunodeficiency virus [HIV] disease: Secondary | ICD-10-CM

## 2010-10-16 DIAGNOSIS — Z79899 Other long term (current) drug therapy: Secondary | ICD-10-CM

## 2010-10-16 DIAGNOSIS — Z113 Encounter for screening for infections with a predominantly sexual mode of transmission: Secondary | ICD-10-CM

## 2010-10-16 NOTE — Progress Notes (Signed)
  Subjective:    Patient ID: Christine Dixon, female    DOB: [birth year], 65 y.o.   MRN: [id number]  HPIEleanor is doing well and has no complaints and is adherent to her Atripla. We talked about the possibilities of new antiHCV drugs over the next two years. She feels well.    Review of Systems  Constitutional: Negative.   HENT: Negative.   Eyes: Negative.   Respiratory: Negative.   Cardiovascular: Negative.   Gastrointestinal: Negative.   Genitourinary: Negative.        Objective:   Physical Exam  Constitutional: She appears well-developed and well-nourished.  HENT:  Head: Normocephalic.  Mouth/Throat: Oropharynx is clear and moist.  Neck: Neck supple.  Cardiovascular: Normal rate and regular rhythm.   Lymphadenopathy:    She has no cervical adenopathy.          Assessment & Plan:

## 2010-11-04 ENCOUNTER — Other Ambulatory Visit (INDEPENDENT_AMBULATORY_CARE_PROVIDER_SITE_OTHER): Payer: Medicare Other | Admitting: Licensed Clinical Social Worker

## 2010-11-04 ENCOUNTER — Other Ambulatory Visit: Payer: Self-pay | Admitting: Licensed Clinical Social Worker

## 2010-11-04 DIAGNOSIS — B20 Human immunodeficiency virus [HIV] disease: Secondary | ICD-10-CM

## 2010-11-04 DIAGNOSIS — I10 Essential (primary) hypertension: Secondary | ICD-10-CM

## 2010-11-04 MED ORDER — RAMIPRIL 10 MG PO CAPS: 10.0000 mg | ORAL_CAPSULE | Freq: Every day | ORAL | Status: DC

## 2010-11-04 MED ORDER — EFAVIRENZ-EMTRICITAB-TENOFOVIR 600-200-300 MG PO TABS: 1.0000 | ORAL_TABLET | Freq: Every day | ORAL | Status: DC

## 2010-11-10 ENCOUNTER — Other Ambulatory Visit (INDEPENDENT_AMBULATORY_CARE_PROVIDER_SITE_OTHER): Payer: Medicare Other | Admitting: *Deleted

## 2010-11-10 DIAGNOSIS — B20 Human immunodeficiency virus [HIV] disease: Secondary | ICD-10-CM

## 2010-11-10 MED ORDER — EFAVIRENZ-EMTRICITAB-TENOFOVIR 600-200-300 MG PO TABS: 1.0000 | ORAL_TABLET | Freq: Every day | ORAL | Status: DC

## 2010-11-11 NOTE — Assessment & Plan Note (Signed)
[facility]                         GASTROENTEROLOGY OFFICE NOTE   TKAI, LARGE                       MRN:          [id number]  DATE:10/24/2007                            DOB:          August 06, 1945    HISTORY:  Ms. Nichelson presents today to establish as a new GI patient. She  has been followed over the years by Dr. Lyla Son for Crohn's disease.  As best I can tell, her Crohn's involved the small intestine, and she  underwent ileocecectomy remotely. There has also been a question of  rectal fistulous disease. The patient also has a history of HIV disease  and hepatitis C. Laboratories by Dr. Orene Desanctis in October revealed normal CBC  as well as CD4 count. Her liver tests are mildly chronically abnormal.  This was attributed to hepatitis C. Her last B12 level was lower limit  of normal. For her Crohn's, she is maintained on Asacol 800 mg t.i.d.  She has done quite well. No abdominal pain, change in bowel habits,  bleeding, or mucus per rectum. Occasionally, she will have diarrhea for  a day or so. Weight has been stable. Last colonoscopy in 2006 revealed  no abnormalities. Random biopsies were taken. Of interest, she was said  to have benign colonic mucosa as well as a fragment of tubular adenoma.  She is allergic or intolerant to codeine and aspirin.   CURRENT MEDICATIONS:  1. Asacol 800 mg t.i.d.  2. Multivitamin.  3. Atripla 1 at night.  4. Ramipril unspecified dosage daily.  5. Calcium supplement.   PHYSICAL EXAMINATION:  Finds a well-appearing female in no acute  distress. She is alert and oriented. Blood pressure is 100/66, heart  rate is 92 and regular. She is 5 feet in height. Her weight is 92  pounds.  HEENT:  Sclerae are anicteric. Conjunctivae are pink. Oral mucosa is  intact. No thrush.  LUNGS:  Are clear to auscultation and percussion.  HEART:  Regular without murmur.  ABDOMEN:  Soft, nontender, nondistended, with good bowel sounds.  No  organomegaly, masses, or hernia. Previous surgical incisions well  healed.  EXTREMITIES:  Without edema.   IMPRESSION:  1. History of ileal Crohn's disease status post ileocecectomy.      Currently asymptomatic on Asacol.  2. Hepatitis C with associated mildly elevated liver tests.  3. History of human immunodeficiency virus disease. Currently being      treated and followed by Dr. Orene Desanctis.   RECOMMENDATIONS:  1. Continue Asacol.  2. Follow-up colonoscopy next year as planned.  3. Resume general medical care with Dr. Orene Desanctis. Interval GI follow-up as      needed.     Docia Chuck. Henrene Pastor, MD  Electronically Signed    JNP/MedQ  DD: 10/24/2007  DT: 10/24/2007  Job #: [id number]   cc:   Alison Murray, M.D.

## 2010-11-14 NOTE — Assessment & Plan Note (Signed)
[facility]                         GASTROENTEROLOGY OFFICE NOTE   LYNETT, BRASIL                       MRN:          [id number]  DATE:06/18/2006                            DOB:          [birth year]    This wonderful patient of mine says she is doing well, she has known  Crohn's ileitis, has had resection of her small bowel.  She says she is  doing fine as far as her GI system is concerned.  Dr. Eulis Canner at [facility]  is following her very closely, and she is only on 1 medication with him  at this present time, and is doing well.  Her last colonoscopic  examination was on October 21, 2004, and biopsies did not reveal any  significant pathology or dysplasia, and she has no symptoms of an  obstructive component at this time.  The only medication she takes is  Asacol 400 mg two t.i.d.  I considered putting her on Lialda or Pentasa,  but she says she is doing so well with these, I thought probably best  just to leave well enough alone.  The only question I have is about her  B12.  Her blood studies have been relatively normal.  There is no  indication of such, but I would think that with her B12 levels always  being at the lower limits of normal and her previous resections, that we  need to follow this with B12 levels to make sure that she does not get  into any difficulty with this.  She might not have enough ileum left to  absorb B12 adequately, and I think this needs to be closely followed.  __________ .  In the meantime I would keep her on her Altace and her  multivitamins, folic acid, watch her kidney function with the Asacol and  her Atripla that she takes as well.   PAST MEDICAL HISTORY:  Reveals that she has had some hypertension,  hepatitis and HIV, and colon surgery that we talked about.   REVIEW OF SYSTEMS:  Noncontributory.   PHYSICAL EXAMINATION:  She is 5 feet, weighs 97 pounds, blood pressure  120/60, pulse 88 and regular.  She looks good  for her 65 years of age.   IMPRESSION:  1. Status post Crohn's disease, post resection.  2. History of hepatitis.  3. 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   RECOMMENDATIONS:  Continue her same medications as prescribed and as we  discussed above.  My only concern is that we have Dr. Orene Desanctis, who does her  blood studies, do B12 levels on her just to make sure that these are  within normal range, so that she does not get symptomatic from a B12  deficiency.    Clarene Reamer, MD  Electronically Signed   SML/MedQ  DD: 06/18/2006  DT: 06/20/2006  Job #: [id number]   cc:   Alison Murray, M.D.

## 2010-11-14 NOTE — Op Note (Signed)
[facility]  Patient:    Christine Dixon, Christine Dixon Visit Number: [id number] MRN: [id number]          Service Type: GON Location: GYN Attending Physician:  Sedonia Small Dictated by:   Madie Reno. Clarene Essex, M.D. Proc. Date: 11/22/01 Admit Date:  11/22/2001   CC:         Beverlee Nims B. Theda Sers, M.D., Ph.D.  Mack Hook, M.D.  Caswell Corwin, R.N.   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  HISTORY OF PRESENT ILLNESS:  This 65 year old woman has a history of abnormal Pap smear in approximately June 2000, treated with LEEP biopsy, with the findings of cervical carcinoma in situ.  Her first Pap smear following LEEP was low-grade SIL, and I saw her in consultation at that time with the finding of significant vaginal atrophy.  I recommended that the patient use vaginal Premarin once weekly and be followed with cytology at six month intervals. The patient was essentially lost to gynecologic follow-up until recently.  She had a Pap smear suggesting low-grade SIL in November by her primary care physician and was referred to Dr. 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  Per colposcopy note, colposcopy was inadequate because of scarring from her prior LEEP biopsy.  She obtained reflex HPV hybrid capture studies which were equivocal for high risk HPV.  PAST MEDICAL HISTORY:  Significant for prior Crohns disease with multiple surgeries, hepatitis C, HIV positive status.  She states that her HIV and hepatitis C viral loads are "minimal," and that she is doing well from those standpoints.  MEDICATIONS:  Asacol, Combivir, and Sustiva.  PAST  SURGICAL HISTORY:  Colon resection in the mid 1980s followed by a second GI surgery of unknown type, related to Crohns disease.  ALLERGIES:  SULFA, CODEINE.  PERSONAL AND SOCIAL HISTORY:  Divorced.  Prior tobacco use, none currently. Ethanol:  Occasionally.  FAMILY HISTORY:  No colon, breast, or ovarian cancer.  REVIEW OF SYSTEMS:  The patient denies vulvovaginal condylomata or sexual transmitted diseases (HIV and hepatitis C are thought related to blood transfusion).  She denies current GI or GU symptoms, pelvic pain or vaginal bleeding.  She denies groin adenopathy or leg swelling.  PHYSICAL EXAMINATION:  VITAL SIGNS:  Weight 111 pounds.  Vital signs stable and afebrile.  GENERAL:  The patient is alert and oriented x 3 in no acute distress.  LYMPHATICS:  There is no supraclavicular or inguinal adenopathy.  BACK:  There is no back or CVA tenderness.  ABDOMEN:  Soft and benign without ascites, mass, or organomegaly.  Midline incision well-healed without hernia.  There is no abdominal tenderness.  EXTREMITIES:  With full range of motion and strength, without edema.  PELVIC:  External genitalia and BUS were normal to inspection and palpation. The vagina is atrophic with good support of bladder and vagina.  This is conical, and the portio of the cervix is not visualized, being flush with the vagina.  There are no gross lesions but healing biopsy site is present in the posterior vagina.  Bimanual and rectovaginal examinations revealed no expansion of the cervix, small uterus, and no adnexal pathology.  PROCEDURE NOTE:  After verbal informed consent was obtained, colposcopy was performed using dilute acetic acid  to survey the entire vagina and cervix. There is nonspecific white epithelium at the identified cervical os, without atypical vascular pattern.  Biopsy site is noted.  No biopsies are obtained.  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  By our records, this has not progressed since my evaluation 18 months ago.  PLAN:  I had a long discussion with the patient detailing the options for management.  Because of her immunocompromised status and assumed intra-abdominal adhesions, I believe that the most prudent course of action would be to reinstitute weekly vaginal estrogen (prescription for Premarin vaginal cream given) and to follow her with cytology at six month intervals. I would repeat colposcopy if she had persistent changes of low-grade squamous intraepithelial lesion lasting for more than 18-24 months but would be less enthusiastic about performing cone biopsy or hysterectomy in this patient solely on the basis of a low-grade squamous intraepithelial lesion, confirmed on biopsy.  I answered multiple questions.  This patient with prefer to be followed by Dr. Theda Sers, and this would be appropriate.  We could see her back at any time on a p.r.n. basis. Dictated by:   Madie Reno. Clarene Essex, M.D. Attending Physician:  Cindie Laroche T DD:  11/22/01 TD:  11/23/01 Job: [id number]

## 2010-11-14 NOTE — Consult Note (Signed)
Christine Dixon, Christine Dixon NO.:  [id number]   MEDICAL RECORD NO.:  [id number]          PATIENT TYPE:  OUT   LOCATION:  GYN                          FACILITY:  [facility]   PHYSICIAN:  Sander Radon, M.D.    DATE OF BIRTH:  05/19/1946   DATE OF CONSULTATION:  10/13/2006  DATE OF DISCHARGE:                                 CONSULTATION   CHIEF COMPLAINT:  This patient is seen as a new patient, as I have not  seen her since Mary 2003.  She is seen at the request of Dr. Sander Radon for evaluation of recurrent abnormal cytology.   HISTORY OF PRESENT ILLNESS:  The patient's abnormal cytology dates to  2000 and was treated with LEEP biopsy, with the findings of cervical  carcinoma in situ.  She then used vaginal estrogen because of low-grade  cytologic changes.  She did have VAIN1 one documented in 2003, and at  that time HPV hybrid capture studies were equivocal for high-risk HPV.  Subsequently, we have very limited history.  It does state that she was  positive for high-risk HPV in August 2006, but on followup in February  2007 she had negative high-risk HPV.  The most recent Pap smear  performed February 2008 was compatible with low-grade SIL, and the  patient is referred for management recommendations.  She is asymptomatic  in regards to her pelvis.  Of note, she is HIV positive, with hepatitis  C.  She has prior Crohn's disease, status post multiple surgeries.  She  states that her HIV and hepatitis C viral loads are minimal, and she  is doing well from those standpoints.   MEDICATIONS:  Asacol, multivitamin, Altace, and retroviral cocktail.   ALLERGIES:  SULFA and CODEINE.   PERSONAL AND SOCIAL HISTORY:  Divorced, with prior tobacco use but none  currently.   FAMILY HISTORY:  No colon, breast, or ovarian cancer.   REVIEW OF SYSTEMS:  The patient denies vulvovaginal condylomas or  sexually transmitted disease (HIV and hepatitis C are thought to be  related to blood  transfusions because of multiple surgeries for Crohn's  disease).  She denies current GI or GU symptoms, pelvic pain, or vaginal  bleeding, and denies groin lymphadenopathy or leg swelling.   PHYSICAL EXAMINATION:  VITAL SIGNS:  Weight 98 pounds, vital signs  stable.  GENERAL:  The patient is alert and oriented x3, in no acute distress.  LYMPHATIC:  Minimal nontender lymphadenopathy in the groins, but  otherwise negative.  BACK:  There is no back or CVA tenderness.  EXTREMITIES:  Have full strength and range of motion, without edema.  PELVIC:  External genitalia:  BUS normal to inspection and palpation.  Bladder and urethra normal and well supported.  Vaginal mucosa atrophic,  as is cervical mucosa.  There is scarring of the exocervix from prior  LEEP biopsy, unable to pass a probes through this.   PROCEDURE NOTE:  After informed consent, I performed colposcopy which  confirmed the findings of atrophic upper vaginal mucosa.   ASSESSMENT:  Low-grade SIL changes on cytology, with negative  high-risk  HPV.   RECOMMENDATIONS:  This patient should be followed with cytology at 6-  month intervals.  At this juncture, I believe that there is no role for  major surgical intervention or cone biopsy.  I would only intervene if  she had cytology suggesting a higher-grade dysplasia.  If she normalizes  her Pap smear on several consecutive values, she could probably evolve  to annual cytology, but I believe that she will require lifelong  surveillance at 22-monthintervals.  The patient understands this, and we  will have Dr. CTheda Serscontinue to follow her.  I will see her at any  time on a p.r.n. basis if there are questions regarding rational  management of this patient who has minimal suspicions of significant  dysplasia, even though she has HIV.      John T. SClarene Essex M.D.  Electronically Signed     ______________________________  DSander Radon M.D.    JTS/MEDQ  D:  10/13/2006  T:   10/14/2006  Job:  [id number]  cc:   DSander Radon M.D.  Fax: [phone]  NCaswell Corwin R.N.  [address] [place] [address]

## 2011-03-24 ENCOUNTER — Other Ambulatory Visit (INDEPENDENT_AMBULATORY_CARE_PROVIDER_SITE_OTHER): Payer: Medicare Other

## 2011-03-24 DIAGNOSIS — B20 Human immunodeficiency virus [HIV] disease: Secondary | ICD-10-CM

## 2011-03-24 DIAGNOSIS — Z113 Encounter for screening for infections with a predominantly sexual mode of transmission: Secondary | ICD-10-CM

## 2011-03-24 DIAGNOSIS — Z79899 Other long term (current) drug therapy: Secondary | ICD-10-CM

## 2011-03-24 LAB — CBC WITH DIFFERENTIAL/PLATELET
Basophils Absolute: 0 10*3/uL (ref 0.0–0.1)
Basophils Relative: 0 % (ref 0–1)
HCT: 33.8 % — ABNORMAL LOW (ref 36.0–46.0)
MCHC: 33.4 g/dL (ref 30.0–36.0)
Monocytes Absolute: 1.2 10*3/uL — ABNORMAL HIGH (ref 0.1–1.0)
Neutro Abs: 9.7 10*3/uL — ABNORMAL HIGH (ref 1.7–7.7)
Platelets: 319 10*3/uL (ref 150–400)
RDW: 12.3 % (ref 11.5–15.5)
WBC: 13.9 10*3/uL — ABNORMAL HIGH (ref 4.0–10.5)

## 2011-03-24 LAB — COMPREHENSIVE METABOLIC PANEL
AST: 35 U/L (ref 0–37)
Albumin: 3.9 g/dL (ref 3.5–5.2)
Alkaline Phosphatase: 122 U/L — ABNORMAL HIGH (ref 39–117)
BUN: 14 mg/dL (ref 6–23)
Creat: 1.16 mg/dL — ABNORMAL HIGH (ref 0.50–1.10)
Glucose, Bld: 134 mg/dL — ABNORMAL HIGH (ref 70–99)
Total Bilirubin: 0.4 mg/dL (ref 0.3–1.2)

## 2011-03-24 LAB — LIPID PANEL
HDL: 45 mg/dL (ref >39)
LDL Cholesterol: 36 mg/dL (ref 0–99)
Total CHOL/HDL Ratio: 2.4 Ratio
Triglycerides: 140 mg/dL (ref <150)
VLDL: 28 mg/dL (ref 0–40)

## 2011-03-25 LAB — T-HELPER CELL (CD4) - (RCID CLINIC ONLY)
CD4 % Helper T Cell: 41 % (ref 33–55)
CD4 T Cell Abs: 1090 uL (ref 400–2700)
CD4 T Cell Abs: 1380

## 2011-03-26 LAB — HIV-1 RNA QUANT-NO REFLEX-BLD: HIV 1 RNA Quant: <20 copies/mL (ref <20)

## 2011-03-31 LAB — T-HELPER CELL (CD4) - (RCID CLINIC ONLY)
CD4 % Helper T Cell: 35
CD4 T Cell Abs: 1250

## 2011-04-09 ENCOUNTER — Ambulatory Visit: Payer: Medicare Other | Admitting: Infectious Diseases

## 2011-04-14 ENCOUNTER — Encounter: Payer: Self-pay | Admitting: Infectious Diseases

## 2011-04-14 ENCOUNTER — Other Ambulatory Visit: Payer: Self-pay | Admitting: Infectious Diseases

## 2011-04-14 ENCOUNTER — Ambulatory Visit (INDEPENDENT_AMBULATORY_CARE_PROVIDER_SITE_OTHER): Payer: Medicare Other | Admitting: Infectious Diseases

## 2011-04-14 VITALS — BP 123/77 | HR 112 | Temp 98.2°F | Ht 60.0 in | Wt 94.0 lb

## 2011-04-14 DIAGNOSIS — Z23 Encounter for immunization: Secondary | ICD-10-CM

## 2011-04-14 DIAGNOSIS — B20 Human immunodeficiency virus [HIV] disease: Secondary | ICD-10-CM

## 2011-08-14 ENCOUNTER — Ambulatory Visit (INDEPENDENT_AMBULATORY_CARE_PROVIDER_SITE_OTHER): Payer: Medicare Other | Admitting: Family Medicine

## 2011-08-14 VITALS — BP 143/74 | HR 114 | Temp 98.8°F | Resp 18 | Ht 59.0 in | Wt 89.0 lb

## 2011-08-14 DIAGNOSIS — R110 Nausea: Secondary | ICD-10-CM

## 2011-08-14 DIAGNOSIS — R7989 Other specified abnormal findings of blood chemistry: Secondary | ICD-10-CM

## 2011-08-14 LAB — POCT CBC
Granulocyte percent: 64.1 %G (ref 37–80)
HCT, POC: 38.9 % (ref 37.7–47.9)
Hemoglobin: 12.1 g/dL — AB (ref 12.2–16.2)
Lymph, poc: 2.8 (ref 0.6–3.4)
MCH, POC: 28.6 pg (ref 27–31.2)
MCHC: 31.1 g/dL — AB (ref 31.8–35.4)
MCV: 91.9 fL (ref 80–97)
MID (cbc): 0.4 (ref 0–0.9)
MPV: 9.4 fL (ref 0–99.8)
POC Granulocyte: 5.8 (ref 2–6.9)
POC LYMPH PERCENT: 31.3 %L (ref 10–50)
POC MID %: 4.6 %M (ref 0–12)
Platelet Count, POC: 262 10*3/uL (ref 142–424)
RBC: 4.23 M/uL (ref 4.04–5.48)
RDW, POC: 13.2 %
WBC: 9 10*3/uL (ref 4.6–10.2)

## 2011-08-14 LAB — COMPREHENSIVE METABOLIC PANEL
ALT: 64 U/L — ABNORMAL HIGH (ref 0–35)
AST: 54 U/L — ABNORMAL HIGH (ref 0–37)
Albumin: 4.6 g/dL (ref 3.5–5.2)
Alkaline Phosphatase: 131 U/L — ABNORMAL HIGH (ref 39–117)
BUN: 17 mg/dL (ref 6–23)
CO2: 19 mEq/L (ref 19–32)
Calcium: 9.4 mg/dL (ref 8.4–10.5)
Chloride: 103 mEq/L (ref 96–112)
Creat: 1.27 mg/dL — ABNORMAL HIGH (ref 0.50–1.10)
Glucose, Bld: 122 mg/dL — ABNORMAL HIGH (ref 70–99)
Potassium: 4.2 mEq/L (ref 3.5–5.3)
Sodium: 134 mEq/L — ABNORMAL LOW (ref 135–145)
Total Bilirubin: 0.4 mg/dL (ref 0.3–1.2)
Total Protein: 8.7 g/dL — ABNORMAL HIGH (ref 6.0–8.3)

## 2011-08-14 LAB — POCT SEDIMENTATION RATE: POCT SED RATE: 40 mm/hr — AB (ref 0–22)

## 2011-08-14 NOTE — Progress Notes (Signed)
66 yo woman with intermittent hot feeling over entire body for several days which seems to come after eating.  Associated with weakness.  No problem with usual activities or walking up stairs.  Symptoms do not awaken.  No known fever, cough, nausea, constipation.  O:  HEENT:  No abnormality; edentulous NAD alert and cooperative Eyes:  No icterus Neck:  Supple, no adenopathy, or thyromegaly Chest:  Clear Heart II/VI systolic ejection type murmur Abd:  Soft, nontender with no HSM Ext:  No edema, full rom  A:  Atypical postprandial symptoms  P:  Need further evaluation: CBC, CMET, abd U/S

## 2011-08-19 NOTE — Progress Notes (Signed)
Addended by: Robyn Haber on: 08/19/2011 11:36 AM   Modules accepted: Orders

## 2011-09-24 ENCOUNTER — Other Ambulatory Visit: Payer: Medicare Other

## 2011-09-24 DIAGNOSIS — B20 Human immunodeficiency virus [HIV] disease: Secondary | ICD-10-CM

## 2011-09-24 LAB — COMPREHENSIVE METABOLIC PANEL
Alkaline Phosphatase: 140 U/L — ABNORMAL HIGH (ref 39–117)
Glucose, Bld: 104 mg/dL — ABNORMAL HIGH (ref 70–99)
Sodium: 136 mEq/L (ref 135–145)
Total Bilirubin: 0.3 mg/dL (ref 0.3–1.2)
Total Protein: 7.9 g/dL (ref 6.0–8.3)

## 2011-09-24 LAB — CBC WITH DIFFERENTIAL/PLATELET
Basophils Relative: 0 % (ref 0–1)
Eosinophils Absolute: 0.1 10*3/uL (ref 0.0–0.7)
Hemoglobin: 10.9 g/dL — ABNORMAL LOW (ref 12.0–15.0)
MCH: 29.8 pg (ref 26.0–34.0)
MCHC: 33.1 g/dL (ref 30.0–36.0)
Monocytes Relative: 11 % (ref 3–12)
Neutrophils Relative %: 60 % (ref 43–77)
Platelets: 301 10*3/uL (ref 150–400)

## 2011-09-25 LAB — T-HELPER CELL (CD4) - (RCID CLINIC ONLY)
CD4 % Helper T Cell: 40 % (ref 33–55)
CD4 T Cell Abs: 1150 uL (ref 400–2700)

## 2011-10-08 ENCOUNTER — Ambulatory Visit (INDEPENDENT_AMBULATORY_CARE_PROVIDER_SITE_OTHER): Payer: Medicare Other | Admitting: Infectious Diseases

## 2011-10-08 ENCOUNTER — Encounter: Payer: Self-pay | Admitting: Infectious Diseases

## 2011-10-08 VITALS — BP 120/71 | HR 96 | Temp 98.0°F | Wt 90.0 lb

## 2011-10-08 DIAGNOSIS — B20 Human immunodeficiency virus [HIV] disease: Secondary | ICD-10-CM

## 2011-10-08 NOTE — Patient Instructions (Signed)
MAKE FOLLOW UP APPT FOR 6 MONTHS

## 2011-10-08 NOTE — Progress Notes (Signed)
Patient ID: Christine Dixon, female   DOB: [birth year], 66 y.o.   MRN: [id number] ID Visit [name] has been stable and adherent to her ARV. She has suppressed HIV and CD4 counts consistently >1000. Her LFTs remain modestly elevated but she has no interest in interferon based therapy based on her knowledge from others and her readings and realizes that she could have early cirrhosis. Both of [place] are hopeful that the new oral antivirals for that so far look like game changing antivirals for HCV. She has had no fevers, weight loss, malaise or rashes. BP 120/71  Pulse 96  Temp(Src) 98 F (36.7 C) (Oral)  Wt 90 lb (40.824 kg) No rashes or adenopathy. Abdomen flat and soft. j Impression and plans HIV stable and continue Atripla and followup in 4 months HCV stable and await FDA approval of new anti viral drugs and hope available in the next year or less.  [name]

## 2011-10-28 ENCOUNTER — Telehealth: Payer: Self-pay

## 2011-10-28 MED ORDER — MESALAMINE 400 MG PO CPDR: 2.0000 | DELAYED_RELEASE_CAPSULE | Freq: Three times a day (TID) | ORAL | Status: DC

## 2011-10-28 NOTE — Telephone Encounter (Signed)
New rx for Delzicol sent

## 2011-11-30 ENCOUNTER — Telehealth: Payer: Self-pay | Admitting: *Deleted

## 2011-11-30 NOTE — Telephone Encounter (Signed)
Pt sees Dr. Theda Sers for her PAP smears.  Message left requesting that pt have Dr. Theda Sers' office fax Dr. Orene Desanctis her most recent PAP smear results.  Pt also needs to make f/u appt with Dr. Orene Desanctis and lab work for October, 2013.

## 2012-02-19 ENCOUNTER — Other Ambulatory Visit: Payer: Self-pay | Admitting: *Deleted

## 2012-02-19 DIAGNOSIS — I10 Essential (primary) hypertension: Secondary | ICD-10-CM

## 2012-02-19 MED ORDER — RAMIPRIL 10 MG PO CAPS: 10.0000 mg | ORAL_CAPSULE | Freq: Every day | ORAL | Status: DC

## 2012-03-15 ENCOUNTER — Other Ambulatory Visit: Payer: Self-pay | Admitting: Infectious Diseases

## 2012-03-15 DIAGNOSIS — Z113 Encounter for screening for infections with a predominantly sexual mode of transmission: Secondary | ICD-10-CM

## 2012-03-15 DIAGNOSIS — Z79899 Other long term (current) drug therapy: Secondary | ICD-10-CM

## 2012-03-15 DIAGNOSIS — B20 Human immunodeficiency virus [HIV] disease: Secondary | ICD-10-CM

## 2012-03-17 ENCOUNTER — Other Ambulatory Visit (HOSPITAL_COMMUNITY)
Admission: RE | Admit: 2012-03-17 | Discharge: 2012-03-17 | Disposition: A | Discharge status: Home / Self Care | Payer: Medicare Other | Source: Ambulatory Visit | Attending: Internal Medicine | Admitting: Internal Medicine

## 2012-03-17 ENCOUNTER — Other Ambulatory Visit: Payer: Medicare Other

## 2012-03-17 DIAGNOSIS — Z79899 Other long term (current) drug therapy: Secondary | ICD-10-CM

## 2012-03-17 DIAGNOSIS — B20 Human immunodeficiency virus [HIV] disease: Secondary | ICD-10-CM

## 2012-03-17 DIAGNOSIS — Z113 Encounter for screening for infections with a predominantly sexual mode of transmission: Secondary | ICD-10-CM

## 2012-03-17 LAB — CBC WITH DIFFERENTIAL/PLATELET
Basophils Relative: 0 % (ref 0–1)
Hemoglobin: 11.5 g/dL — ABNORMAL LOW (ref 12.0–15.0)
Lymphs Abs: 2.9 10*3/uL (ref 0.7–4.0)
MCHC: 34.2 g/dL (ref 30.0–36.0)
Monocytes Relative: 7 % (ref 3–12)
Neutro Abs: 8 10*3/uL — ABNORMAL HIGH (ref 1.7–7.7)
Neutrophils Relative %: 68 % (ref 43–77)
RBC: 3.82 MIL/uL — ABNORMAL LOW (ref 3.87–5.11)

## 2012-03-17 LAB — COMPREHENSIVE METABOLIC PANEL
ALT: 50 U/L — ABNORMAL HIGH (ref 0–35)
Albumin: 4.2 g/dL (ref 3.5–5.2)
Alkaline Phosphatase: 131 U/L — ABNORMAL HIGH (ref 39–117)
Glucose, Bld: 121 mg/dL — ABNORMAL HIGH (ref 70–99)
Potassium: 4.2 mEq/L (ref 3.5–5.3)
Sodium: 137 mEq/L (ref 135–145)
Total Protein: 7.9 g/dL (ref 6.0–8.3)

## 2012-03-17 LAB — LIPID PANEL
Cholesterol: 138 mg/dL (ref 0–200)
LDL Cholesterol: 54 mg/dL (ref 0–99)
VLDL: 18 mg/dL (ref 0–40)

## 2012-03-18 LAB — T-HELPER CELL (CD4) - (RCID CLINIC ONLY)
CD4 % Helper T Cell: 34 % (ref 33–55)
CD4 T Cell Abs: 980 uL (ref 400–2700)

## 2012-03-30 ENCOUNTER — Ambulatory Visit (INDEPENDENT_AMBULATORY_CARE_PROVIDER_SITE_OTHER): Payer: Medicare Other | Admitting: Infectious Diseases

## 2012-03-30 ENCOUNTER — Encounter: Payer: Self-pay | Admitting: Infectious Diseases

## 2012-03-30 ENCOUNTER — Ambulatory Visit: Payer: Medicare Other | Admitting: Infectious Diseases

## 2012-03-30 VITALS — BP 125/58 | HR 80 | Temp 98.0°F | Wt 88.8 lb

## 2012-03-30 DIAGNOSIS — Z79899 Other long term (current) drug therapy: Secondary | ICD-10-CM

## 2012-03-30 DIAGNOSIS — Z23 Encounter for immunization: Secondary | ICD-10-CM

## 2012-03-30 DIAGNOSIS — B20 Human immunodeficiency virus [HIV] disease: Secondary | ICD-10-CM

## 2012-03-30 NOTE — Progress Notes (Signed)
Patient ID: Christine Dixon, female   DOB: August 27, 1945, 66 y.o.   MRN: [id number] HIV f/u visit    Christine Dixon is well and with no complaints. She has no fevers, weight loss or abdominal pains. Her HIV remains undectable and her CD4 is >1000. She continues to have modest elevations of ALT and AST reflecting her underlying HCV infection. I informed her that we might have HCV drug trial in half a year or so and she would be interested. I shared this with our study coordinator, Herschell Dimes. BP 125/58  Pulse 80  Temp 98 F (36.7 C) (Oral)  Wt 88 lb 12.8 oz (40.279 kg)  [name] is well nourished but always thin in overall appearance. No rashes or adenopathy. Abdomen is flat and without detectable hepatosplenomegaly.  Impression/plans HIV suppressed and continue present Atripla will see in f/u in 5 months. HCV [name] has been uninterested in current treatments and is interested in the early results from trials of newer anti HCV drugs and when/if we have trial I have shared her interest with study coordinator, Herschell Dimes and in turn with Dr. Tommy Medal. [name]

## 2012-04-01 ENCOUNTER — Ambulatory Visit: Payer: Medicare Other | Admitting: Infectious Diseases

## 2012-04-04 ENCOUNTER — Encounter: Payer: Self-pay | Admitting: Infectious Diseases

## 2012-05-06 ENCOUNTER — Ambulatory Visit: Payer: Medicare Other | Admitting: Infectious Diseases

## 2012-05-11 ENCOUNTER — Other Ambulatory Visit: Payer: Self-pay | Admitting: *Deleted

## 2012-05-11 DIAGNOSIS — I10 Essential (primary) hypertension: Secondary | ICD-10-CM

## 2012-05-11 DIAGNOSIS — B20 Human immunodeficiency virus [HIV] disease: Secondary | ICD-10-CM

## 2012-05-11 MED ORDER — EFAVIRENZ-EMTRICITAB-TENOFOVIR 600-200-300 MG PO TABS: 1.0000 | ORAL_TABLET | Freq: Every day | ORAL | Status: DC

## 2012-05-11 MED ORDER — RAMIPRIL 10 MG PO CAPS: 10.0000 mg | ORAL_CAPSULE | Freq: Every day | ORAL | Status: DC

## 2012-05-13 ENCOUNTER — Other Ambulatory Visit: Payer: Self-pay | Admitting: Licensed Clinical Social Worker

## 2012-05-13 DIAGNOSIS — B20 Human immunodeficiency virus [HIV] disease: Secondary | ICD-10-CM

## 2012-05-13 MED ORDER — EFAVIRENZ-EMTRICITAB-TENOFOVIR 600-200-300 MG PO TABS: 1.0000 | ORAL_TABLET | Freq: Every day | ORAL | Status: DC

## 2012-05-17 ENCOUNTER — Other Ambulatory Visit: Payer: Self-pay | Admitting: *Deleted

## 2012-05-17 DIAGNOSIS — I10 Essential (primary) hypertension: Secondary | ICD-10-CM

## 2012-05-17 DIAGNOSIS — B20 Human immunodeficiency virus [HIV] disease: Secondary | ICD-10-CM

## 2012-05-17 MED ORDER — RAMIPRIL 10 MG PO CAPS: 10.0000 mg | ORAL_CAPSULE | Freq: Every day | ORAL | Status: DC

## 2012-05-17 MED ORDER — EFAVIRENZ-EMTRICITAB-TENOFOVIR 600-200-300 MG PO TABS: 1.0000 | ORAL_TABLET | Freq: Every day | ORAL | Status: DC

## 2012-07-25 ENCOUNTER — Telehealth: Payer: Self-pay | Admitting: *Deleted

## 2012-07-25 NOTE — Telephone Encounter (Signed)
RN spoke with the pt.  Pt had been seeing Dr. Mady Gemma who is no longer in GYN practice.  Needing a new GYN MD.  To call [facility] for appt.

## 2012-09-01 ENCOUNTER — Other Ambulatory Visit: Payer: Medicare Other

## 2012-09-01 DIAGNOSIS — Z79899 Other long term (current) drug therapy: Secondary | ICD-10-CM

## 2012-09-01 LAB — CBC WITH DIFFERENTIAL/PLATELET
Basophils Relative: 0 % (ref 0–1)
Eosinophils Absolute: 0.1 10*3/uL (ref 0.0–0.7)
MCH: 30.1 pg (ref 26.0–34.0)
MCHC: 32.7 g/dL (ref 30.0–36.0)
Monocytes Relative: 9 % (ref 3–12)
Neutrophils Relative %: 60 % (ref 43–77)
Platelets: 297 10*3/uL (ref 150–400)

## 2012-09-01 LAB — COMPLETE METABOLIC PANEL WITH GFR
Alkaline Phosphatase: 141 U/L — ABNORMAL HIGH (ref 39–117)
GFR, Est Non African American: 40 mL/min — ABNORMAL LOW
Glucose, Bld: 133 mg/dL — ABNORMAL HIGH (ref 70–99)
Sodium: 135 mEq/L (ref 135–145)
Total Bilirubin: 0.3 mg/dL (ref 0.3–1.2)
Total Protein: 7.7 g/dL (ref 6.0–8.3)

## 2012-09-01 LAB — LIPID PANEL
Cholesterol: 129 mg/dL (ref 0–200)
Triglycerides: 108 mg/dL (ref <150)
VLDL: 22 mg/dL (ref 0–40)

## 2012-09-02 LAB — T-HELPER CELL (CD4) - (RCID CLINIC ONLY): CD4 % Helper T Cell: 37 % (ref 33–55)

## 2012-09-03 LAB — HIV-1 RNA QUANT-NO REFLEX-BLD: HIV 1 RNA Quant: <20 copies/mL (ref <20)

## 2012-09-16 ENCOUNTER — Encounter: Payer: Self-pay | Admitting: Infectious Diseases

## 2012-09-16 ENCOUNTER — Ambulatory Visit (INDEPENDENT_AMBULATORY_CARE_PROVIDER_SITE_OTHER): Payer: Medicare Other | Admitting: Infectious Diseases

## 2012-09-16 VITALS — BP 135/79 | HR 103 | Temp 97.9°F | Ht 59.0 in | Wt 86.0 lb

## 2012-09-16 DIAGNOSIS — B1710 Acute hepatitis C without hepatic coma: Secondary | ICD-10-CM

## 2012-09-16 DIAGNOSIS — B20 Human immunodeficiency virus [HIV] disease: Secondary | ICD-10-CM

## 2012-09-16 NOTE — Progress Notes (Signed)
Patient ID: KHALESSI BLOUGH, female   DOB: 1945-10-19, 67 y.o.   MRN: [id number] HIV f/u Ms Tess has no compaliants and has suppressed HIV with CD4 counts>1000. Major issue at present is her chronic HCV infection of probably >15 years' duration. She has had mild but persistent elevation of her LFTS but because she refused, which of course is her right, to go on ribaviron and interferon she has never had biopsy. Given the new era of powerful and tolerable anti HCV meds she will be referred to out local hepatitis C program in conjunction with [facility].  BP 135/79  Pulse 103  Temp(Src) 97.9 F (36.6 C) (Oral)  Ht 4' 11"  (1.499 m)  Wt 86 lb (39.009 kg)  BMI 17.36 kg/m2 Healthy in appearance and without jaundice. A & P HIV suppressed and continue ARVs HCV refer for oral antivral Rx which she is interested in. She wants to see Dr. Baxter Flattery when I stop seeing patients this summer. [name]

## 2012-10-06 ENCOUNTER — Ambulatory Visit (INDEPENDENT_AMBULATORY_CARE_PROVIDER_SITE_OTHER): Payer: Medicare Other | Admitting: Obstetrics & Gynecology

## 2012-10-06 ENCOUNTER — Encounter: Payer: Self-pay | Admitting: Obstetrics & Gynecology

## 2012-10-06 ENCOUNTER — Other Ambulatory Visit: Payer: Self-pay | Admitting: Obstetrics & Gynecology

## 2012-10-06 VITALS — BP 127/71 | HR 120 | Temp 98.0°F | Ht 60.0 in | Wt 87.0 lb

## 2012-10-06 DIAGNOSIS — Z01419 Encounter for gynecological examination (general) (routine) without abnormal findings: Secondary | ICD-10-CM

## 2012-10-06 DIAGNOSIS — Z124 Encounter for screening for malignant neoplasm of cervix: Secondary | ICD-10-CM

## 2012-10-06 NOTE — Progress Notes (Signed)
Subjective:     Christine Dixon is a 67 y.o. female here for a routine exam.  Current complaints: pt states she has had some abnormal pap smears. Pt states she has a LEEP procedure. Pt states sh had been informed to have her paps ever 6 months. Personal health questionnaire reviewed: no.   Gynecologic History No LMP recorded. Patient is postmenopausal. Contraception: condoms Last Pap: 2013. Results were: abnormal Last mammogram: 09-29-12. Results were: normal     The following portions of the patient's history were reviewed and updated as appropriate: allergies, current medications, past family history, past medical history, past social history, past surgical history and problem list.  Review of Systems Pertinent items are noted in HPI.    Objective:    General appearance: alert Breasts: normal appearance, no masses or tenderness Abdomen: soft, non-tender; bowel sounds normal; no masses,  no organomegaly Pelvic: cervix incompletely seen--synechiae at the apex, external genitalia atrophic, no adnexal masses or tenderness, uterus normal size, shape, and consistency and vagina normal without discharge      Assessment:    Healthy female exam.    Plan:    Return prn or annual f/u Review previous records

## 2012-10-06 NOTE — Patient Instructions (Signed)

## 2012-10-07 LAB — PAP IG W/ RFLX HPV ASCU

## 2012-10-08 ENCOUNTER — Encounter: Payer: Self-pay | Admitting: Obstetrics & Gynecology

## 2012-10-08 DIAGNOSIS — R87612 Low grade squamous intraepithelial lesion on cytologic smear of cervix (LGSIL): Secondary | ICD-10-CM | POA: Insufficient documentation

## 2012-10-31 ENCOUNTER — Telehealth: Payer: Self-pay | Admitting: *Deleted

## 2012-11-01 ENCOUNTER — Encounter: Payer: Self-pay | Admitting: Obstetrics & Gynecology

## 2012-11-14 ENCOUNTER — Encounter: Payer: Self-pay | Admitting: *Deleted

## 2012-11-14 ENCOUNTER — Encounter: Payer: Self-pay | Admitting: Obstetrics & Gynecology

## 2012-11-14 NOTE — Telephone Encounter (Signed)
opened in error

## 2012-11-18 ENCOUNTER — Other Ambulatory Visit: Payer: Medicare Other

## 2012-11-18 ENCOUNTER — Other Ambulatory Visit: Payer: Self-pay | Admitting: Infectious Diseases

## 2012-11-18 DIAGNOSIS — B20 Human immunodeficiency virus [HIV] disease: Secondary | ICD-10-CM

## 2012-11-18 LAB — CBC WITH DIFFERENTIAL/PLATELET
Basophils Relative: 0 % (ref 0–1)
Eosinophils Absolute: 0.1 10*3/uL (ref 0.0–0.7)
Eosinophils Relative: 1 % (ref 0–5)
Lymphs Abs: 2.6 10*3/uL (ref 0.7–4.0)
MCH: 29.8 pg (ref 26.0–34.0)
MCHC: 33.6 g/dL (ref 30.0–36.0)
MCV: 88.9 fL (ref 78.0–100.0)
Platelets: 286 10*3/uL (ref 150–400)
RBC: 3.42 MIL/uL — ABNORMAL LOW (ref 3.87–5.11)

## 2012-11-18 LAB — COMPREHENSIVE METABOLIC PANEL
CO2: 18 mEq/L — ABNORMAL LOW (ref 19–32)
Creat: 1.49 mg/dL — ABNORMAL HIGH (ref 0.50–1.10)
Glucose, Bld: 126 mg/dL — ABNORMAL HIGH (ref 70–99)
Total Bilirubin: 0.3 mg/dL (ref 0.3–1.2)

## 2012-11-30 ENCOUNTER — Ambulatory Visit: Payer: Medicare Other | Admitting: Infectious Diseases

## 2012-11-30 ENCOUNTER — Encounter: Payer: Self-pay | Admitting: Infectious Diseases

## 2012-11-30 ENCOUNTER — Ambulatory Visit (INDEPENDENT_AMBULATORY_CARE_PROVIDER_SITE_OTHER): Payer: Medicare Other | Admitting: Infectious Diseases

## 2012-11-30 VITALS — BP 144/66 | HR 94 | Temp 98.4°F | Wt 85.0 lb

## 2012-11-30 DIAGNOSIS — B20 Human immunodeficiency virus [HIV] disease: Secondary | ICD-10-CM

## 2012-11-30 NOTE — Progress Notes (Signed)
Patient ID: Christine Dixon, female   DOB: 1945/11/15, 67 y.o.   MRN: [id number] HIV f/u     [name] feels well and has had no fevers, jaundice, or weight loss. She adheres to her Atripla well and over 15 years plus in my care she has had suppression of her HIV and now has a stable CD4 count in 900 to 1150 range. She has had no AIDS defining infections or tumors. She is coinfected with HCV and has refused referral for older therapies for HCV. She has had low but persistent elevations of her AST/ALT at 2-3x upper limits of normal. She has never had liver biopsy but would suspect that she probably has some cirrhosis. When the reimbursement picture for the new highly effective and new antiHCV drugs is sorted out, i.e.when medicare works out reimbursement decisions with pharma she will clearly be a candidate for therapy. Currently her HIV is suppressed and CD4 is 980. Christine Dixon has developed a mild but definite anemia over the past 2 years with normal ferritin and B12 levels and is assumed to be from a combination of her chronic viral infections with HIV and HCV. But if she is successfully treated for HCV and continues to be anemic but with normal MCV and platelets that she may require further evaluation for myeloma or marrow examination. I have told her that we continue to observe the anemia. She also has controllable hypertension.  Exam  BP 144/66  Pulse 94  Temp(Src) 98.4 F (36.9 C) (Oral)  Wt 85 lb (38.556 kg)  BMI 16.6 kg/m2  Thin but no change from past. She has not rashes or adenopathy. Oral mucosa is normal.  I&P HIV  Well controlled for over decade and continue Atripla HCV When payment debate over new HCV oral antivirals is settled, she should be high priority for treatment. She does not want to be referred yet to hepatology until the new and less toxic regimens are available. Anemia   Possibly related to chronic diseases and is normocytic. In past ferritin has been normal but will repeat it and  also evaluate for possible myeloma with SPEP and free serum light chains before next f/u visit with Dr. Baxter Flattery who will be her HIV physician with my impending retirement. [name]

## 2012-11-30 NOTE — Patient Instructions (Addendum)
Keep up the good work. Follow up with Dr Baxter Flattery as we discussed.

## 2012-12-01 ENCOUNTER — Other Ambulatory Visit: Payer: Self-pay | Admitting: Internal Medicine

## 2012-12-01 DIAGNOSIS — N179 Acute kidney failure, unspecified: Secondary | ICD-10-CM

## 2012-12-01 DIAGNOSIS — D649 Anemia, unspecified: Secondary | ICD-10-CM

## 2012-12-02 ENCOUNTER — Ambulatory Visit: Payer: Medicare Other | Admitting: Infectious Diseases

## 2012-12-06 ENCOUNTER — Telehealth: Payer: Self-pay | Admitting: *Deleted

## 2012-12-06 NOTE — Telephone Encounter (Signed)
Called patient per Dr Orene Desanctis to have her come in to have further labs done to check her creatinine and anemia. She advised she can come 12/07/12 at 11 am. Advised her should be a quick in and out lab visit and we will call her once the results come in.

## 2012-12-07 ENCOUNTER — Other Ambulatory Visit (INDEPENDENT_AMBULATORY_CARE_PROVIDER_SITE_OTHER): Payer: Medicare Other

## 2012-12-07 DIAGNOSIS — N179 Acute kidney failure, unspecified: Secondary | ICD-10-CM

## 2012-12-07 DIAGNOSIS — D649 Anemia, unspecified: Secondary | ICD-10-CM

## 2012-12-07 LAB — BASIC METABOLIC PANEL WITH GFR
BUN: 16 mg/dL (ref 6–23)
CO2: 18 mEq/L — ABNORMAL LOW (ref 19–32)
GFR, Est African American: 39 mL/min — ABNORMAL LOW
Glucose, Bld: 113 mg/dL — ABNORMAL HIGH (ref 70–99)
Potassium: 3.9 mEq/L (ref 3.5–5.3)
Sodium: 137 mEq/L (ref 135–145)

## 2012-12-07 LAB — PHOSPHORUS: Phosphorus: 2.2 mg/dL — ABNORMAL LOW (ref 2.3–4.6)

## 2012-12-09 LAB — PROTEIN ELECTROPHORESIS, SERUM, WITH REFLEX
Albumin ELP: 49.4 % — ABNORMAL LOW (ref 55.8–66.1)
Alpha-2-Globulin: 10.5 % (ref 7.1–11.8)
Beta 2: 5.6 % (ref 3.2–6.5)
Beta Globulin: 5.4 % (ref 4.7–7.2)

## 2012-12-09 NOTE — Progress Notes (Signed)
Patient ID: Christine Dixon, female   DOB: [birth year], 67 y.o.   MRN: [id number] On Christine Dixon's visit a week and a half ago two new changes were noted:1.Worsening anemia to new hemoglobin of 10 gms. She has had a mild anemia atttributed to chronic diseases of HIV, HCV, and Crohn disease. Labs drawn earlier this week show a normal ferritin and negative screenings for myeloma. So anemia of chronic disease is most likely "cause" of her anemia. Also her azotemia which also was a bit worse with creatinine of l.5. Repeat shows creatinine of 2.0 four days ago. This could well be from tubular and renal damage from tenofovir and indeed a spot urine for phospherus was elevated at 64 mg%.  Plan Patient called this PM and message left for her to call me or [facility]. Will d/c Atripla and prescribe Epzicom and dolutegravir for her new ARV regimen if  She tests HLA negative for marker gene for reaction to abacavir which is formulated in Epzicom. She will come in next week for HLA testing. [name]

## 2012-12-12 ENCOUNTER — Other Ambulatory Visit: Payer: Medicare Other

## 2012-12-12 DIAGNOSIS — B20 Human immunodeficiency virus [HIV] disease: Secondary | ICD-10-CM

## 2012-12-16 ENCOUNTER — Telehealth: Payer: Self-pay | Admitting: *Deleted

## 2012-12-16 NOTE — Telephone Encounter (Signed)
Patient called to see if the results of her recent test were back. Advised her that it takes that test 7-10 to result and it is not back yet but as soon as it does we will call her.

## 2012-12-19 LAB — HLA B*5701

## 2012-12-21 ENCOUNTER — Other Ambulatory Visit: Payer: Self-pay | Admitting: Licensed Clinical Social Worker

## 2012-12-21 DIAGNOSIS — B20 Human immunodeficiency virus [HIV] disease: Secondary | ICD-10-CM

## 2012-12-21 MED ORDER — ABACAVIR SULFATE-LAMIVUDINE 600-300 MG PO TABS: 1.0000 | ORAL_TABLET | Freq: Every day | ORAL | Status: DC

## 2012-12-21 MED ORDER — DOLUTEGRAVIR SODIUM 50 MG PO TABS: 50.0000 mg | ORAL_TABLET | Freq: Every day | ORAL | Status: DC

## 2013-03-06 ENCOUNTER — Telehealth: Payer: Self-pay | Admitting: *Deleted

## 2013-03-06 NOTE — Telephone Encounter (Signed)
Patient called and advised that she has noticed some uneasy feelings since starting the Epzicom and Tivicay. She advised it is not painful but she feels shaky after taking the medication. She reports taking it in the morning after eating breakfast and shortly thereafter she feels anxious and shaky. She is not sure it is the medication be it is the only thing she has changed in her life and wants to know it that could be what it is. Advised she has a trip planned for next week and is afraid to go. Advised her will send Dr Baxter Flattery a message and get back to the with an answer soon.

## 2013-03-07 NOTE — Telephone Encounter (Signed)
Can you overbook for next week. Need to see her

## 2013-03-07 NOTE — Telephone Encounter (Signed)
Called patient per Dr Baxter Flattery and tried to schedule an appt but she advised she will be out of town next week and will wait until after that to be seen. She advised she feel ok but she is a little anxious she also advised if she has an episode she will call back but thinks she can wait until her regular appt next month. Advised her as long as she is ok with that and that she can call anytime.

## 2013-03-20 ENCOUNTER — Ambulatory Visit (INDEPENDENT_AMBULATORY_CARE_PROVIDER_SITE_OTHER): Payer: Medicare Other | Admitting: Obstetrics & Gynecology

## 2013-03-20 VITALS — BP 110/64 | Temp 98.0°F | Ht 60.0 in | Wt 85.0 lb

## 2013-03-20 DIAGNOSIS — N893 Dysplasia of vagina, unspecified: Secondary | ICD-10-CM

## 2013-03-20 DIAGNOSIS — N870 Mild cervical dysplasia: Secondary | ICD-10-CM

## 2013-03-20 NOTE — Progress Notes (Signed)
Subjective:     Christine Dixon is a 67 y.o. female here for a routine exam.  Current complaints: Patient is in the office for annual exam-  Patient states she hs hx of abnormal pap..  Personal health questionnaire reviewed: no.   Gynecologic History No LMP recorded. Patient is postmenopausal. Contraception: post menopausal status Last Pap: last year. Results were: normal Last mammogram: 2014. Results were: normal  Obstetric History OB History  No data available     The following portions of the patient's history were reviewed and updated as appropriate: allergies, current medications, past family history, past medical history, past social history, past surgical history and problem list.  Review of Systems Pertinent items are noted in HPI.    Objective:     Vulva: no lesions; atrophic changes      Assessment:   H/O persistent LSIL pap   Plan:   Review records from [place] Return in 6 mths

## 2013-03-21 ENCOUNTER — Encounter: Payer: Self-pay | Admitting: Obstetrics & Gynecology

## 2013-03-28 ENCOUNTER — Other Ambulatory Visit: Payer: Medicare Other

## 2013-03-28 DIAGNOSIS — B20 Human immunodeficiency virus [HIV] disease: Secondary | ICD-10-CM

## 2013-03-28 LAB — CBC WITH DIFFERENTIAL/PLATELET
Basophils Relative: 0 % (ref 0–1)
Eosinophils Absolute: 0.1 10*3/uL (ref 0.0–0.7)
Eosinophils Relative: 1 % (ref 0–5)
HCT: 26.6 % — ABNORMAL LOW (ref 36.0–46.0)
Hemoglobin: 9.3 g/dL — ABNORMAL LOW (ref 12.0–15.0)
Lymphs Abs: 3 10*3/uL (ref 0.7–4.0)
MCH: 32 pg (ref 26.0–34.0)
MCHC: 35 g/dL (ref 30.0–36.0)
Monocytes Absolute: 0.8 10*3/uL (ref 0.1–1.0)
Monocytes Relative: 9 % (ref 3–12)
Neutro Abs: 5.4 10*3/uL (ref 1.7–7.7)
Neutrophils Relative %: 58 % (ref 43–77)
RDW: 16.4 % — ABNORMAL HIGH (ref 11.5–15.5)
WBC: 9.3 10*3/uL (ref 4.0–10.5)

## 2013-03-28 LAB — COMPLETE METABOLIC PANEL WITH GFR
Albumin: 3.6 g/dL (ref 3.5–5.2)
Alkaline Phosphatase: 77 U/L (ref 39–117)
BUN: 18 mg/dL (ref 6–23)
Calcium: 9 mg/dL (ref 8.4–10.5)
GFR, Est Non African American: 26 mL/min — ABNORMAL LOW
Glucose, Bld: 113 mg/dL — ABNORMAL HIGH (ref 70–99)
Potassium: 3.8 mEq/L (ref 3.5–5.3)
Total Bilirubin: 0.3 mg/dL (ref 0.3–1.2)

## 2013-03-29 LAB — HIV-1 RNA QUANT-NO REFLEX-BLD
HIV 1 RNA Quant: <20 copies/mL (ref <20)
HIV-1 RNA Quant, Log: <1.3 {Log} (ref <1.30)

## 2013-03-29 LAB — T-HELPER CELL (CD4) - (RCID CLINIC ONLY): CD4 T Cell Abs: 1140 /uL (ref 400–2700)

## 2013-04-03 ENCOUNTER — Ambulatory Visit: Payer: Medicare Other | Admitting: Internal Medicine

## 2013-04-11 ENCOUNTER — Encounter: Payer: Self-pay | Admitting: Internal Medicine

## 2013-04-11 ENCOUNTER — Ambulatory Visit (INDEPENDENT_AMBULATORY_CARE_PROVIDER_SITE_OTHER): Payer: Medicare Other | Admitting: Internal Medicine

## 2013-04-11 VITALS — BP 107/63 | HR 106 | Temp 98.2°F | Wt 86.0 lb

## 2013-04-11 DIAGNOSIS — B1920 Unspecified viral hepatitis C without hepatic coma: Secondary | ICD-10-CM

## 2013-04-11 DIAGNOSIS — N189 Chronic kidney disease, unspecified: Secondary | ICD-10-CM

## 2013-04-11 LAB — BASIC METABOLIC PANEL WITH GFR
BUN: 18 mg/dL (ref 6–23)
Creat: 1.79 mg/dL — ABNORMAL HIGH (ref 0.50–1.10)
GFR, Est African American: 33 mL/min — ABNORMAL LOW
GFR, Est Non African American: 29 mL/min — ABNORMAL LOW
Potassium: 3.9 mEq/L (ref 3.5–5.3)
Sodium: 133 mEq/L — ABNORMAL LOW (ref 135–145)

## 2013-04-11 NOTE — Progress Notes (Signed)
[facility] CLINIC NOTE  RFV: routine   Subjective:    Patient ID: Christine Dixon, female    DOB: 1945/08/13, 67 y.o.   MRN: [id number]  HPI 67yo F with HIV-HCV, cd 4 count of 1140/VL<20, previously on atripla however her cr increased to 1.95 now with GFR 30, and now changed tivicay/epzicom. She is tolerating new medicaitons without difficulty. Not missing a dose. No recent illnesses  Current Outpatient Prescriptions on File Prior to Visit  Medication Sig Dispense Refill  . abacavir-lamiVUDine (EPZICOM) 600-300 MG per tablet Take 1 tablet by mouth daily.  30 tablet  3  . Calcium 150 MG TABS Take 1 tablet by mouth.        . dolutegravir (TIVICAY) 50 MG tablet Take 1 tablet (50 mg total) by mouth daily.  30 tablet  3  . Mesalamine (DELZICOL) 400 MG CPDR Take 2 tablets by mouth 3 (three) times daily.  180 capsule  2  . ramipril (ALTACE) 10 MG capsule Take 1 capsule (10 mg total) by mouth daily.  90 capsule  3   No current facility-administered medications on file prior to visit.   Active Ambulatory Problems    Diagnosis Date Noted  . HIV DISEASE 07/22/2006  . HEPATITIS C 07/22/2006  . ANEMIA OF CHRONIC DISEASE 07/22/2006  . 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 DYSPLASIA, CERVIX, MILD 07/22/2006  . DYSPLASIA, VAGINA 07/22/2006  . OSTEOPOROSIS 07/22/2006  . 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 No Resolved Ambulatory Problems   Past Medical History  Diagnosis Date  . HIV infection   . Hypertension     Soc hx: loves shopping, and doing crafts. daughter diagnosed with MS.    Review of Systems  Constitutional: Negative for fever, chills, diaphoresis, activity change, appetite change, fatigue and unexpected weight change.  HENT: Negative for congestion, sore throat, rhinorrhea, sneezing, trouble swallowing and sinus pressure.  Eyes: Negative for photophobia and  visual disturbance.  Respiratory: Negative for cough, chest tightness, shortness of breath, wheezing and stridor.  Cardiovascular: Negative for chest pain, palpitations and leg swelling.  Gastrointestinal: Negative for nausea, vomiting, abdominal pain, diarrhea, constipation, blood in stool, abdominal distention and anal bleeding.  Genitourinary: Negative for dysuria, hematuria, flank pain and difficulty urinating.  Musculoskeletal: Negative for myalgias, back pain, joint swelling, arthralgias and gait problem.  Skin: Negative for color change, pallor, rash and wound.  Neurological: Negative for dizziness, tremors, weakness and light-headedness.  Hematological: Negative for adenopathy. Does not bruise/bleed easily.  Psychiatric/Behavioral: Negative for behavioral problems, confusion, sleep disturbance, dysphoric mood, decreased concentration and agitation.        Objective:   Physical Exam  BP 107/63  Pulse 106  Temp(Src) 98.2 F (36.8 C) (Oral)  Wt 86 lb (39.009 kg)  BMI 16.8 kg/m2 Physical Exam  Constitutional:  oriented to person, place, and time.  appears well-developed and well-nourished. No distress. Petite frame HENT:  Mouth/Throat: Oropharynx is clear and moist. No oropharyngeal exudate.  Cardiovascular: Normal rate, regular rhythm and normal heart sounds. Exam reveals no gallop and no friction rub.  No murmur heard.  Pulmonary/Chest: Effort normal and breath sounds normal. No respiratory distress.  no wheezes.  Abdominal: Soft. Bowel sounds are normal. exhibits no distension. There is no tenderness.  Lymphadenopathy:  no cervical adenopathy.  Neurological: He is alert and oriented to person, place, and time.  Skin: Skin is warm and  dry. No rash noted. No erythema.  Psychiatric:  a normal mood and affect.  behavior is normal.      Assessment & Plan:  HIV = continue with tivicay and epzicom  CKD= will check ua, urine protein, ur cr.alb ratio, and ur phos. Repeat bmp.  Ask her to stop taking ace inhibitor. BP not necessary excessively elevated.  Hcv= will check genotype, hcv viral load, hep A Ab to see if she is immune. She will need to be revaccinated for hep B starting the next visit.  Health maintenance = will get flu vaccine today  rtc in 1 month to check on cr and bp

## 2013-04-12 LAB — HEPATITIS C RNA QUANTITATIVE
HCV Quantitative Log: 5.15 {Log} — ABNORMAL HIGH (ref <1.18)
HCV Quantitative: 140985 IU/mL — ABNORMAL HIGH (ref <15)

## 2013-04-12 LAB — URINALYSIS
Hgb urine dipstick: NEGATIVE
Ketones, ur: NEGATIVE mg/dL
Nitrite: NEGATIVE
Protein, ur: NEGATIVE mg/dL
Specific Gravity, Urine: 1.015 (ref 1.005–1.030)
Urobilinogen, UA: 0.2 mg/dL (ref 0.0–1.0)

## 2013-04-12 LAB — MICROALBUMIN / CREATININE URINE RATIO: Creatinine, Urine: 117.2 mg/dL

## 2013-05-08 ENCOUNTER — Other Ambulatory Visit: Payer: Self-pay | Admitting: Infectious Diseases

## 2013-05-11 ENCOUNTER — Ambulatory Visit (HOSPITAL_COMMUNITY)
Admission: RE | Admit: 2013-05-11 | Discharge: 2013-05-11 | Disposition: A | Discharge status: Home / Self Care | Payer: Medicare Other | Source: Ambulatory Visit | Attending: Internal Medicine | Admitting: Internal Medicine

## 2013-05-11 ENCOUNTER — Encounter: Payer: Self-pay | Admitting: Internal Medicine

## 2013-05-11 ENCOUNTER — Ambulatory Visit (INDEPENDENT_AMBULATORY_CARE_PROVIDER_SITE_OTHER): Payer: Medicare Other | Admitting: Internal Medicine

## 2013-05-11 ENCOUNTER — Other Ambulatory Visit: Payer: Self-pay

## 2013-05-11 VITALS — BP 164/82 | HR 132 | Temp 98.6°F

## 2013-05-11 DIAGNOSIS — R000 Tachycardia, unspecified: Secondary | ICD-10-CM | POA: Insufficient documentation

## 2013-05-11 DIAGNOSIS — I498 Other specified cardiac arrhythmias: Secondary | ICD-10-CM

## 2013-05-11 DIAGNOSIS — R9431 Abnormal electrocardiogram [ECG] [EKG]: Secondary | ICD-10-CM | POA: Insufficient documentation

## 2013-05-11 DIAGNOSIS — N179 Acute kidney failure, unspecified: Secondary | ICD-10-CM

## 2013-05-11 LAB — GLUCOSE, CAPILLARY: Glucose-Capillary: 113 mg/dL — ABNORMAL HIGH (ref 70–99)

## 2013-05-11 NOTE — Progress Notes (Signed)
[facility] NOTE  RFV: routine   Subjective:    Patient ID: Christine Dixon, female    DOB: 1945-08-04, 67 y.o.   MRN: [id number]  HPI Christine Dixon is a 67yo F with HIV-HCV, geno 1a, CD 4 count 1140/VL<20.on tivicay/epzicom. This morning she reported Feeling jittery in waiting room. Found to have HR 150-160, then on recheck 110-120s. Asymptomatic. Brought back to the room for acute onset of symptoms and evaluation. She states that she has less jitteriness? No chest pain, no palpitations, no lightheadedness. BP 134/80s. BS checked > 100. She states that she has episodic symptoms as these. Doing well otherwise, no difficulty with hiv medicaiton. Ate a full breakfast this morning  Current Outpatient Prescriptions on File Prior to Visit  Medication Sig Dispense Refill  . Calcium 150 MG TABS Take 1 tablet by mouth.        . EPZICOM 600-300 MG per tablet TAKE 1 TABLET EVERY DAY  30 tablet  3  . Mesalamine (DELZICOL) 400 MG CPDR Take 2 tablets by mouth 3 (three) times daily.  180 capsule  2  . ramipril (ALTACE) 10 MG capsule Take 1 capsule (10 mg total) by mouth daily.  90 capsule  3  . TIVICAY 50 MG tablet TAKE 1 TABLET EVERY DAY  30 tablet  3   No current facility-administered medications on file prior to visit.   Active Ambulatory Problems    Diagnosis Date Noted  . HIV DISEASE 07/22/2006  . HEPATITIS C 07/22/2006  . ANEMIA OF CHRONIC DISEASE 07/22/2006  . 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 DYSPLASIA, CERVIX, MILD 07/22/2006  . DYSPLASIA, VAGINA 07/22/2006  . OSTEOPOROSIS 07/22/2006  . 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 No Resolved Ambulatory Problems   Past Medical History  Diagnosis Date  . HIV infection   . Hypertension       Review of Systems  Constitutional: Negative for fever, chills, diaphoresis, activity change, appetite change, fatigue and  unexpected weight change.  HENT: Negative for congestion, sore throat, rhinorrhea, sneezing, trouble swallowing and sinus pressure.  Eyes: Negative for photophobia and visual disturbance.  Respiratory: Negative for cough, chest tightness, shortness of breath, wheezing and stridor.  Cardiovascular: Negative for chest pain, palpitations and leg swelling.  Gastrointestinal: Negative for nausea, vomiting, abdominal pain, diarrhea, constipation, blood in stool, abdominal distention and anal bleeding.  Genitourinary: Negative for dysuria, hematuria, flank pain and difficulty urinating.  Musculoskeletal: Negative for myalgias, back pain, joint swelling, arthralgias and gait problem.  Skin: Negative for color change, pallor, rash and wound.  Neurological: Negative for dizziness, tremors, weakness and light-headedness.  Hematological: Negative for adenopathy. Does not bruise/bleed easily.  Psychiatric/Behavioral: Negative for behavioral problems, confusion, sleep disturbance, dysphoric mood, decreased concentration and agitation.       Objective:   Physical Exam BP 164/82  Pulse 132  Temp(Src) 98.6 F (37 C) (Oral) Physical Exam  Constitutional: oriented to person, place, and time. appears well-developed and well-nourished. No distress.  HENT:  Mouth/Throat: Oropharynx is clear and moist. No oropharyngeal exudate.  Cardiovascular: tachycardic, regular rhythm and normal heart sounds. Exam reveals no gallop and no friction rub.  No murmur heard.  Pulmonary/Chest: Effort normal and breath sounds normal. No respiratory distress.  no wheezes.  Abdominal: Soft. Bowel sounds are normal. exhibits no distension. There is no tenderness.  Lymphadenopathy:  no cervical adenopathy.  Neurological:  alert  and oriented to person, place, and time.  Skin: Skin is warm and dry. No rash noted. No erythema.  Psychiatric:  a normal mood and affect.  behavior is normal.     Imaging: Ekg done at visit: NSR,  normal axis. HR 110s, no st depression, elevation, or LVH.       Assessment & Plan:   HIV = doing well, with great viral load suppression, will continue with truvada and tivicay  HTN = elevated at this visit,but in setting of HR in 130s. On recheck Bp 134/84. Will continue her on ramipril for now. Will check BMP to ensure no AKI  Sinus Tachycardia = new onset. Asymptomatic now but initially felt jittery. Prior HR on last visit 90-100s. We will check ecg to see if in afib, although appears regular on exam. Will refer her to establish care with PCP to have more frequent monitoring. On final BP check, HR in 110s.    rtc in 3 months

## 2013-05-12 ENCOUNTER — Other Ambulatory Visit: Payer: Self-pay | Admitting: Infectious Diseases

## 2013-05-15 ENCOUNTER — Other Ambulatory Visit: Payer: Medicare Other

## 2013-05-15 DIAGNOSIS — N179 Acute kidney failure, unspecified: Secondary | ICD-10-CM

## 2013-05-15 LAB — CBC WITH DIFFERENTIAL/PLATELET
Basophils Relative: 0 % (ref 0–1)
Hemoglobin: 11.1 g/dL — ABNORMAL LOW (ref 12.0–15.0)
Lymphs Abs: 3.4 10*3/uL (ref 0.7–4.0)
MCHC: 33.5 g/dL (ref 30.0–36.0)
Monocytes Relative: 9 % (ref 3–12)
Neutro Abs: 5.2 10*3/uL (ref 1.7–7.7)
Neutrophils Relative %: 55 % (ref 43–77)
Platelets: 259 10*3/uL (ref 150–400)
RBC: 3.57 MIL/uL — ABNORMAL LOW (ref 3.87–5.11)

## 2013-05-15 LAB — BASIC METABOLIC PANEL
CO2: 22 mEq/L (ref 19–32)
Calcium: 9.5 mg/dL (ref 8.4–10.5)
Creat: 1.53 mg/dL — ABNORMAL HIGH (ref 0.50–1.10)

## 2013-09-18 ENCOUNTER — Ambulatory Visit: Payer: Medicare Other | Admitting: Obstetrics & Gynecology

## 2013-09-18 ENCOUNTER — Ambulatory Visit (INDEPENDENT_AMBULATORY_CARE_PROVIDER_SITE_OTHER): Payer: Medicare Other | Admitting: Obstetrics & Gynecology

## 2013-09-18 VITALS — BP 169/79 | HR 101 | Temp 97.3°F | Ht 60.0 in | Wt 95.0 lb

## 2013-09-18 DIAGNOSIS — Z0000 Encounter for general adult medical examination without abnormal findings: Secondary | ICD-10-CM

## 2013-09-18 DIAGNOSIS — N870 Mild cervical dysplasia: Secondary | ICD-10-CM

## 2013-09-18 DIAGNOSIS — R87612 Low grade squamous intraepithelial lesion on cytologic smear of cervix (LGSIL): Secondary | ICD-10-CM

## 2013-09-18 NOTE — Progress Notes (Signed)
Subjective:     Christine Dixon is a 68 y.o. female here for a routine exam.  Current complaints: Patient has a history of abnormal pap smears and is in the office to have a repeat.Patient has hot flashes at night but otherwise she is doing ok..  Personal health questionnaire reviewed: no.   Gynecologic History No LMP recorded. Patient is postmenopausal. Contraception: post menopausal status Last Pap: spring 2014. Results were: abnormal LSIL Last mammogram: due next month. Results were: normal  Obstetric History OB History  No data available     The following portions of the patient's history were reviewed and updated as appropriate: allergies, current medications, past family history, past medical history, past social history, past surgical history and problem list.  Review of Systems Pertinent items are noted in HPI.    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

## 2013-09-19 ENCOUNTER — Encounter: Payer: Self-pay | Admitting: Obstetrics & Gynecology

## 2013-09-19 NOTE — Patient Instructions (Signed)

## 2013-09-21 LAB — PAP IG (IMAGE GUIDED)

## 2013-10-10 ENCOUNTER — Other Ambulatory Visit: Payer: Self-pay | Admitting: Internal Medicine

## 2013-10-10 DIAGNOSIS — B20 Human immunodeficiency virus [HIV] disease: Secondary | ICD-10-CM

## 2013-10-23 ENCOUNTER — Other Ambulatory Visit: Payer: Medicare Other

## 2013-10-23 DIAGNOSIS — Z79899 Other long term (current) drug therapy: Secondary | ICD-10-CM

## 2013-10-23 DIAGNOSIS — Z113 Encounter for screening for infections with a predominantly sexual mode of transmission: Secondary | ICD-10-CM

## 2013-10-23 DIAGNOSIS — B20 Human immunodeficiency virus [HIV] disease: Secondary | ICD-10-CM

## 2013-10-23 LAB — CBC WITH DIFFERENTIAL/PLATELET
BASOS PCT: 0 % (ref 0–1)
Basophils Absolute: 0 10*3/uL (ref 0.0–0.1)
EOS ABS: 0.1 10*3/uL (ref 0.0–0.7)
EOS PCT: 1 % (ref 0–5)
HCT: 32.4 % — ABNORMAL LOW (ref 36.0–46.0)
HEMOGLOBIN: 11.1 g/dL — AB (ref 12.0–15.0)
LYMPHS ABS: 2.4 10*3/uL (ref 0.7–4.0)
Lymphocytes Relative: 30 % (ref 12–46)
MCH: 29.6 pg (ref 26.0–34.0)
MCHC: 34.3 g/dL (ref 30.0–36.0)
MCV: 86.4 fL (ref 78.0–100.0)
MONO ABS: 0.5 10*3/uL (ref 0.1–1.0)
MONOS PCT: 6 % (ref 3–12)
Neutro Abs: 5 10*3/uL (ref 1.7–7.7)
Neutrophils Relative %: 63 % (ref 43–77)
Platelets: 181 10*3/uL (ref 150–400)
RBC: 3.75 MIL/uL — ABNORMAL LOW (ref 3.87–5.11)
RDW: 14.3 % (ref 11.5–15.5)
WBC: 8 10*3/uL (ref 4.0–10.5)

## 2013-10-23 LAB — COMPLETE METABOLIC PANEL WITH GFR
ALK PHOS: 77 U/L (ref 39–117)
ALT: 60 U/L — ABNORMAL HIGH (ref 0–35)
AST: 37 U/L (ref 0–37)
Albumin: 4.2 g/dL (ref 3.5–5.2)
BILIRUBIN TOTAL: 0.4 mg/dL (ref 0.2–1.2)
BUN: 14 mg/dL (ref 6–23)
CO2: 20 meq/L (ref 19–32)
CREATININE: 1.49 mg/dL — AB (ref 0.50–1.10)
Calcium: 9.2 mg/dL (ref 8.4–10.5)
Chloride: 109 mEq/L (ref 96–112)
GFR, EST AFRICAN AMERICAN: 41 mL/min — AB
GFR, EST NON AFRICAN AMERICAN: 36 mL/min — AB
GLUCOSE: 138 mg/dL — AB (ref 70–99)
Potassium: 4.1 mEq/L (ref 3.5–5.3)
Sodium: 139 mEq/L (ref 135–145)
Total Protein: 7.8 g/dL (ref 6.0–8.3)

## 2013-10-23 LAB — LIPID PANEL
CHOLESTEROL: 135 mg/dL (ref 0–200)
HDL: 54 mg/dL (ref >39)
LDL Cholesterol: 40 mg/dL (ref 0–99)
Total CHOL/HDL Ratio: 2.5 Ratio
Triglycerides: 205 mg/dL — ABNORMAL HIGH (ref <150)
VLDL: 41 mg/dL — AB (ref 0–40)

## 2013-10-24 LAB — HIV-1 RNA QUANT-NO REFLEX-BLD: HIV-1 RNA Quant, Log: <1.3 {Log} (ref <1.30)

## 2013-10-24 LAB — T-HELPER CELL (CD4) - (RCID CLINIC ONLY)
CD4 % Helper T Cell: 39 % (ref 33–55)
CD4 T CELL ABS: 1050 /uL (ref 400–2700)

## 2013-10-24 LAB — RPR

## 2013-11-06 ENCOUNTER — Encounter: Payer: Self-pay | Admitting: Internal Medicine

## 2013-11-06 ENCOUNTER — Ambulatory Visit (INDEPENDENT_AMBULATORY_CARE_PROVIDER_SITE_OTHER): Payer: Medicare Other | Admitting: Internal Medicine

## 2013-11-06 VITALS — BP 130/86 | HR 112 | Temp 97.8°F | Wt 94.2 lb

## 2013-11-06 DIAGNOSIS — B20 Human immunodeficiency virus [HIV] disease: Secondary | ICD-10-CM

## 2013-11-06 DIAGNOSIS — Z1239 Encounter for other screening for malignant neoplasm of breast: Secondary | ICD-10-CM

## 2013-11-06 DIAGNOSIS — Z1231 Encounter for screening mammogram for malignant neoplasm of breast: Secondary | ICD-10-CM

## 2013-11-06 DIAGNOSIS — Z23 Encounter for immunization: Secondary | ICD-10-CM

## 2013-11-06 MED ORDER — ABACAVIR-DOLUTEGRAVIR-LAMIVUD 600-50-300 MG PO TABS: 1.0000 | ORAL_TABLET | Freq: Every day | ORAL | Status: DC

## 2013-11-06 NOTE — Addendum Note (Signed)
Addended by: Reggy Eye on: 11/06/2013 10:22 AM   Modules accepted: Orders

## 2013-11-06 NOTE — Progress Notes (Signed)
   Subjective:    Patient ID: Christine Dixon, female    DOB: 1946-02-07, 68 y.o.   MRN: [id number]  HPI 68yo F with HIV-HCV geno 1a, well controlled hiv with cd 4 count of 1050/VL<20, on tivicay/epzicom. She is doing well no complaints with her health, although has much more family responsibility. 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 She also has a daughter with ms and Rasheka helps out with 4 grandchildren. Current Outpatient Prescriptions on File Prior to Visit  Medication Sig Dispense Refill  . Calcium 150 MG TABS Take 1 tablet by mouth.        . EPZICOM 600-300 MG per tablet TAKE 1 TABLET EVERY DAY  30 tablet  5  . TIVICAY 50 MG tablet TAKE 1 TABLET EVERY DAY  30 tablet  5   No current facility-administered medications on file prior to visit.   Active Ambulatory Problems    Diagnosis Date Noted  . HIV DISEASE 07/22/2006  . HEPATITIS C 07/22/2006  . ANEMIA OF CHRONIC DISEASE 07/22/2006  . 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 DYSPLASIA, CERVIX, MILD 07/22/2006  . DYSPLASIA, VAGINA 07/22/2006  . OSTEOPOROSIS 07/22/2006  . 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 No Resolved Ambulatory Problems   Past Medical History  Diagnosis Date  . HIV infection   . Hypertension        Review of Systems 10 point ros is negative    Objective:   Physical Exam BP 130/86  Pulse 112  Temp(Src) 97.8 F (36.6 C) (Oral)  Wt 94 lb 3.4 oz (42.733 kg) Physical Exam  Constitutional:  oriented to person, place, and time. appears well-developed and well-nourished. No distress.  HENT:  Mouth/Throat: Oropharynx is clear and moist. No oropharyngeal exudate.  Cardiovascular: Normal rate, regular rhythm and normal heart sounds. Exam reveals no gallop and no friction rub.  No murmur heard.  Pulmonary/Chest: Effort normal and breath  sounds normal. No respiratory distress.  has no wheezes.  Abdominal: Soft. Bowel sounds are normal.  exhibits no distension. There is no tenderness.  Lymphadenopathy: no cervical adenopathy.  Neurological: alert and oriented to person, place, and time.  Skin: Skin is warm and dry. No rash noted. No erythema.  Psychiatric: a normal mood and affect.  behavior is normal.         Assessment & Plan:  hiv = well controlled, will switch her to triomeq to reduce pill burden since she is taking tivicay + epzicom  HCV = will redo hep b series since it does not look like she seroconverted from last series in 2002.she Will also consider for treatment. She states that she has too much on her plate presently  General health = had pap done this past year. Will need to get mammo

## 2013-11-15 ENCOUNTER — Ambulatory Visit (INDEPENDENT_AMBULATORY_CARE_PROVIDER_SITE_OTHER): Payer: Medicare Other | Admitting: Obstetrics & Gynecology

## 2013-11-15 ENCOUNTER — Encounter: Payer: Self-pay | Admitting: Obstetrics & Gynecology

## 2013-11-15 VITALS — BP 120/75 | HR 111 | Temp 98.8°F | Ht 60.0 in | Wt 94.0 lb

## 2013-11-15 DIAGNOSIS — N893 Dysplasia of vagina, unspecified: Secondary | ICD-10-CM

## 2013-11-15 DIAGNOSIS — R87612 Low grade squamous intraepithelial lesion on cytologic smear of cervix (LGSIL): Secondary | ICD-10-CM

## 2013-11-15 NOTE — Progress Notes (Signed)
Colposcopy Procedure Note  Indications: Pap smear several months ago showed: low-grade squamous intraepithelial neoplasia (LGSIL - encompassing HPV,mild dysplasia,CIN I). H/O VAIN I.  Procedure Details  The risks and benefits of the procedure and Verbal informed consent obtained.  A time-out was performed confirming the patient, procedure and allergy status  Speculum placed in vagina and excellent visualization of cervix achieved, cervix swabbed x 3 with acetic acid solution.  Findings: Cervix:  Unable to visualize secondary to scarring.   Vaginal inspection: normal without visible lesions.    Physical Exam   Specimens: None  Complications: none.

## 2013-11-28 ENCOUNTER — Ambulatory Visit (HOSPITAL_COMMUNITY)
Admission: RE | Admit: 2013-11-28 | Discharge: 2013-11-28 | Disposition: A | Discharge status: Home / Self Care | Payer: Medicare Other | Source: Ambulatory Visit | Attending: Obstetrics & Gynecology | Admitting: Obstetrics & Gynecology

## 2013-11-28 DIAGNOSIS — R87612 Low grade squamous intraepithelial lesion on cytologic smear of cervix (LGSIL): Secondary | ICD-10-CM

## 2013-11-28 DIAGNOSIS — R87619 Unspecified abnormal cytological findings in specimens from cervix uteri: Secondary | ICD-10-CM | POA: Insufficient documentation

## 2013-12-07 ENCOUNTER — Ambulatory Visit (INDEPENDENT_AMBULATORY_CARE_PROVIDER_SITE_OTHER): Payer: Medicare Other | Admitting: *Deleted

## 2013-12-07 DIAGNOSIS — Z23 Encounter for immunization: Secondary | ICD-10-CM

## 2013-12-07 NOTE — Patient Instructions (Signed)
Pt to return in 4 months for #3 dose,

## 2013-12-07 NOTE — Progress Notes (Signed)
Hep B #2 vaccine

## 2014-04-30 ENCOUNTER — Encounter: Payer: Self-pay | Admitting: Obstetrics & Gynecology

## 2014-06-12 ENCOUNTER — Other Ambulatory Visit: Payer: Medicare Other

## 2014-06-12 DIAGNOSIS — B20 Human immunodeficiency virus [HIV] disease: Secondary | ICD-10-CM

## 2014-06-12 LAB — BASIC METABOLIC PANEL
BUN: 12 mg/dL (ref 6–23)
CHLORIDE: 108 meq/L (ref 96–112)
CO2: 20 meq/L (ref 19–32)
CREATININE: 1.48 mg/dL — AB (ref 0.50–1.10)
Calcium: 9 mg/dL (ref 8.4–10.5)
Glucose, Bld: 131 mg/dL — ABNORMAL HIGH (ref 70–99)
Potassium: 4.2 mEq/L (ref 3.5–5.3)
Sodium: 140 mEq/L (ref 135–145)

## 2014-06-13 LAB — T-HELPER CELL (CD4) - (RCID CLINIC ONLY)
CD4 T CELL HELPER: 34 % (ref 33–55)
CD4 T Cell Abs: 1170 /uL (ref 400–2700)

## 2014-06-13 LAB — HIV-1 RNA QUANT-NO REFLEX-BLD
HIV 1 RNA Quant: 131 copies/mL — ABNORMAL HIGH (ref <20)
HIV-1 RNA QUANT, LOG: 2.12 {Log} — AB (ref <1.30)

## 2014-06-25 ENCOUNTER — Encounter: Payer: Self-pay | Admitting: *Deleted

## 2014-06-26 ENCOUNTER — Encounter: Payer: Self-pay | Admitting: Obstetrics & Gynecology

## 2014-07-04 ENCOUNTER — Encounter: Payer: Self-pay | Admitting: Internal Medicine

## 2014-07-04 ENCOUNTER — Ambulatory Visit (INDEPENDENT_AMBULATORY_CARE_PROVIDER_SITE_OTHER): Payer: Medicare Other | Admitting: Internal Medicine

## 2014-07-04 VITALS — BP 148/69 | HR 73 | Temp 98.4°F | Wt 92.0 lb

## 2014-07-04 DIAGNOSIS — B182 Chronic viral hepatitis C: Secondary | ICD-10-CM

## 2014-07-04 NOTE — Progress Notes (Signed)
Patient ID: Christine Dixon, female   DOB: 11/09/1945, 69 y.o.   MRN: [id number]       Patient ID: Christine Dixon, female   DOB: 01-25-1946, 69 y.o.   MRN: [id number]  HPI 69yo F with HIV-HCV geno 1a, well controlled hiv with cd 4 count of 1170/VL131 (dec 2015), on triumeq. She is doing well but occasionally has spells of feeling poorly. Had episode of myalgias x 1 day in December at time of lab draw. She states she notices symptoms shortly after taking triumeq  Outpatient Encounter Prescriptions as of 07/04/2014  Medication Sig  . Abacavir-Dolutegravir-Lamivud (TRIUMEQ) 600-50-300 MG TABS Take 1 tablet by mouth daily.     Patient Active Problem List   Diagnosis Date Noted  . Papanicolaou smear of cervix with low grade squamous intraepithelial lesion (LGSIL) 10/08/2012  . 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 HIV DISEASE 07/22/2006  . HEPATITIS C 07/22/2006  . ANEMIA OF CHRONIC DISEASE 07/22/2006  . DYSPLASIA, CERVIX, MILD 07/22/2006  . DYSPLASIA, VAGINA 07/22/2006  . OSTEOPOROSIS 07/22/2006     Health Maintenance Due  Topic Date Due  . TETANUS/TDAP  03/13/1964  . COLONOSCOPY  03/14/1995  . ZOSTAVAX  03/13/2005  . MAMMOGRAM  09/28/2013  . INFLUENZA VACCINE  01/27/2014     Review of Systems 10 point ros reviewed, positive pertinents listed in hpi  Physical Exam   BP 148/69 mmHg  Pulse 73  Temp(Src) 98.4 F (36.9 C) (Oral)  Wt 92 lb (41.731 kg) Physical Exam  Constitutional:  oriented to person, place, and time. appears well-developed and well-nourished. No distress.  HENT:  Mouth/Throat: Oropharynx is clear and moist. No oropharyngeal exudate.  Cardiovascular: Normal rate, regular rhythm and normal heart sounds. Exam reveals no gallop and no friction rub.  No murmur heard.  Pulmonary/Chest: Effort normal and breath sounds normal. No respiratory distress.  has no wheezes.  Abdominal: Soft. Bowel sounds are normal.  exhibits no distension. There is no tenderness.    Lymphadenopathy: no cervical adenopathy.  Neurological: alert and oriented to person, place, and time.  Skin: Skin is warm and dry. No rash noted. No erythema.  Psychiatric: a normal mood and affect. behavior is normal.   Lab Results  Component Value Date   CD4TCELL 34 06/12/2014   Lab Results  Component Value Date   CD4TABS 1170 06/12/2014   CD4TABS 1050 10/23/2013   CD4TABS 1140 03/28/2013   Lab Results  Component Value Date   HIV1RNAQUANT 131* 06/12/2014   Lab Results  Component Value Date   HEPBSAB No 08/23/2006   No results found for: RPR  CBC Lab Results  Component Value Date   WBC 8.0 10/23/2013   RBC 3.75* 10/23/2013   HGB 11.1* 10/23/2013   HCT 32.4* 10/23/2013   PLT 181 10/23/2013   MCV 86.4 10/23/2013   MCH 29.6 10/23/2013   MCHC 34.3 10/23/2013   RDW 14.3 10/23/2013   LYMPHSABS 2.4 10/23/2013   MONOABS 0.5 10/23/2013   EOSABS 0.1 10/23/2013   BASOSABS 0.0 10/23/2013   BMET Lab Results  Component Value Date   NA 140 06/12/2014   K 4.2 06/12/2014   CL 108 06/12/2014   CO2 20 06/12/2014   GLUCOSE 131* 06/12/2014   BUN 12 06/12/2014   CREATININE 1.48* 06/12/2014   CALCIUM 9.0 06/12/2014   GFRNONAA 36* 10/23/2013   GFRAA 41* 10/23/2013     Assessment and Plan  hiv disease with hx of LGSIL = continue on triumeq but see  if can take at night time to decrease side effects. Had viral blip at last blood draw  Chronic hcv without hepatic coma = will order elastography  crohns disease = stable no recent flares  Cervical dysplasia = will need to ensure she has annual screening

## 2014-07-06 LAB — HEPATITIS C RNA QUANTITATIVE
HCV QUANT LOG: 5.6 {Log} — AB (ref <1.18)
HCV Quantitative: 401447 IU/mL — ABNORMAL HIGH (ref <15)

## 2014-07-10 ENCOUNTER — Telehealth: Payer: Self-pay | Admitting: Licensed Clinical Social Worker

## 2014-07-10 NOTE — Telephone Encounter (Signed)
Patient states that she still feels bad with the medication Triumeq, she tried taking it at a different time and still feels bad. Please advise She states that her symptoms are sometimes diarrhea, and body aches

## 2014-11-06 ENCOUNTER — Other Ambulatory Visit: Payer: Medicare Other

## 2014-11-06 DIAGNOSIS — Z21 Asymptomatic human immunodeficiency virus [HIV] infection status: Secondary | ICD-10-CM

## 2014-11-06 LAB — CBC WITH DIFFERENTIAL/PLATELET
BASOS ABS: 0 10*3/uL (ref 0.0–0.1)
BASOS PCT: 0 % (ref 0–1)
Eosinophils Absolute: 0.1 10*3/uL (ref 0.0–0.7)
Eosinophils Relative: 1 % (ref 0–5)
HCT: 35.5 % — ABNORMAL LOW (ref 36.0–46.0)
HEMOGLOBIN: 11.6 g/dL — AB (ref 12.0–15.0)
Lymphocytes Relative: 32 % (ref 12–46)
Lymphs Abs: 2.9 10*3/uL (ref 0.7–4.0)
MCH: 28 pg (ref 26.0–34.0)
MCHC: 32.7 g/dL (ref 30.0–36.0)
MCV: 85.7 fL (ref 78.0–100.0)
MONO ABS: 0.7 10*3/uL (ref 0.1–1.0)
MPV: 10.7 fL (ref 8.6–12.4)
Monocytes Relative: 8 % (ref 3–12)
NEUTROS ABS: 5.4 10*3/uL (ref 1.7–7.7)
NEUTROS PCT: 59 % (ref 43–77)
Platelets: 183 10*3/uL (ref 150–400)
RBC: 4.14 MIL/uL (ref 3.87–5.11)
RDW: 15.7 % — ABNORMAL HIGH (ref 11.5–15.5)
WBC: 9.1 10*3/uL (ref 4.0–10.5)

## 2014-11-06 LAB — COMPLETE METABOLIC PANEL WITH GFR
ALK PHOS: 75 U/L (ref 39–117)
ALT: 34 U/L (ref 0–35)
AST: 29 U/L (ref 0–37)
Albumin: 3.9 g/dL (ref 3.5–5.2)
BILIRUBIN TOTAL: 0.4 mg/dL (ref 0.2–1.2)
BUN: 15 mg/dL (ref 6–23)
CO2: 22 meq/L (ref 19–32)
Calcium: 9.3 mg/dL (ref 8.4–10.5)
Chloride: 105 mEq/L (ref 96–112)
Creat: 1.48 mg/dL — ABNORMAL HIGH (ref 0.50–1.10)
GFR, EST AFRICAN AMERICAN: 41 mL/min — AB
GFR, Est Non African American: 36 mL/min — ABNORMAL LOW
Glucose, Bld: 104 mg/dL — ABNORMAL HIGH (ref 70–99)
Potassium: 4.1 mEq/L (ref 3.5–5.3)
SODIUM: 139 meq/L (ref 135–145)
TOTAL PROTEIN: 7.7 g/dL (ref 6.0–8.3)

## 2014-11-07 LAB — T-HELPER CELL (CD4) - (RCID CLINIC ONLY)
CD4 % Helper T Cell: 33 % (ref 33–55)
CD4 T Cell Abs: 1050 /uL (ref 400–2700)

## 2014-11-08 LAB — HIV-1 RNA QUANT-NO REFLEX-BLD

## 2014-11-19 ENCOUNTER — Ambulatory Visit: Payer: Medicare Other | Admitting: Obstetrics & Gynecology

## 2014-11-20 ENCOUNTER — Encounter: Payer: Self-pay | Admitting: Internal Medicine

## 2014-11-20 ENCOUNTER — Ambulatory Visit (INDEPENDENT_AMBULATORY_CARE_PROVIDER_SITE_OTHER): Payer: Medicare Other | Admitting: Internal Medicine

## 2014-11-20 VITALS — BP 186/74 | HR 99 | Temp 97.1°F | Wt 95.0 lb

## 2014-11-20 DIAGNOSIS — R002 Palpitations: Secondary | ICD-10-CM

## 2014-11-20 DIAGNOSIS — B182 Chronic viral hepatitis C: Secondary | ICD-10-CM | POA: Diagnosis present

## 2014-11-20 DIAGNOSIS — B20 Human immunodeficiency virus [HIV] disease: Secondary | ICD-10-CM | POA: Diagnosis not present

## 2014-11-20 NOTE — Progress Notes (Signed)
Patient ID: Christine Dixon, female   DOB: [birth year], 69 y.o.   MRN: [id number]       Patient ID: Christine Dixon, female   DOB: 1945-10-26, 69 y.o.   MRN: [id number]  HPI  Lumi is a 69yo F with HIV-HCV co infection. HIV disease is well controlled, CD 4 count of 1050/VL<20 on triumeq. She is tx naive hcv with geno1a. Having episodes of palpitations, has seen pcp at [facility], had work up? Thought to be due to anxiety. She did not have any recent cardiac work up such as stress test. She feels that her current hiv meds are too strong. She was previously well controlled on atripla, she was switched to DLG-epzicom in Oct 2014 due to CKD and minimizing risk of osteoporosis with tdf/atripla. SHe was then switched to triumeq single tablet in may 2015. Cr 1.48 GFR 41.  Outpatient Encounter Prescriptions as of 11/20/2014  Medication Sig  . Abacavir-Dolutegravir-Lamivud (TRIUMEQ) 600-50-300 MG TABS Take 1 tablet by mouth daily.   No facility-administered encounter medications on file as of 11/20/2014.     Patient Active Problem List   Diagnosis Date Noted  . Papanicolaou smear of cervix with low grade squamous intraepithelial lesion (LGSIL) 10/08/2012  . 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 HIV DISEASE 07/22/2006  . HEPATITIS C 07/22/2006  . ANEMIA OF CHRONIC DISEASE 07/22/2006  . DYSPLASIA, CERVIX, MILD 07/22/2006  . DYSPLASIA, VAGINA 07/22/2006  . OSTEOPOROSIS 07/22/2006     Health Maintenance Due  Topic Date Due  . TETANUS/TDAP  03/13/1964  . COLONOSCOPY  03/14/1995  . ZOSTAVAX  03/13/2005  . PNA vac Low Risk Adult (2 of 2 - PCV13) 04/13/2012  . MAMMOGRAM  09/28/2013     Review of Systems + palpitations  Family hx = mom has heart murmur. No CAD or stroke in family  Physical Exam   BP 186/74 mmHg  Pulse 99  Temp(Src) 97.1 F (36.2 C) (Oral)  Wt 95 lb (43.092 kg) Physical Exam  Constitutional:  oriented to person, place, and time. appears well-developed and  well-nourished. No distress.  HENT: [place]/AT, PERRLA, no scleral icterus Mouth/Throat: Oropharynx is clear and moist. No oropharyngeal exudate.  Cardiovascular: Normal rate, regular rhythm and normal heart sounds. Exam reveals no gallop and no friction rub.  No murmur heard.  Pulmonary/Chest: Effort normal and breath sounds normal. No respiratory distress.  has no wheezes.  Neck = supple, no nuchal rigidity Abdominal: Soft. Bowel sounds are normal.  exhibits no distension. There is no tenderness.  Lymphadenopathy: no cervical adenopathy. No axillary adenopathy Neurological: alert and oriented to person, place, and time.  Skin: Skin is warm and dry. No rash noted. No erythema.  Psychiatric: a normal mood and affect.  behavior is normal.    Lab Results  Component Value Date   CD4TCELL 33 11/06/2014   Lab Results  Component Value Date   CD4TABS 1050 11/06/2014   CD4TABS 1170 06/12/2014   CD4TABS 1050 10/23/2013   Lab Results  Component Value Date   HIV1RNAQUANT <20 11/06/2014   Lab Results  Component Value Date   HEPBSAB No 08/23/2006   No results found for: RPR  CBC Lab Results  Component Value Date   WBC 9.1 11/06/2014   RBC 4.14 11/06/2014   HGB 11.6* 11/06/2014   HCT 35.5* 11/06/2014   PLT 183 11/06/2014   MCV 85.7 11/06/2014   MCH 28.0 11/06/2014   MCHC 32.7 11/06/2014   RDW 15.7* 11/06/2014   LYMPHSABS 2.9  11/06/2014   MONOABS 0.7 11/06/2014   EOSABS 0.1 11/06/2014   BASOSABS 0.0 11/06/2014   BMET Lab Results  Component Value Date   NA 139 11/06/2014   K 4.1 11/06/2014   CL 105 11/06/2014   CO2 22 11/06/2014   GLUCOSE 104* 11/06/2014   BUN 15 11/06/2014   CREATININE 1.48* 11/06/2014   CALCIUM 9.3 11/06/2014   GFRNONAA 36* 11/06/2014   GFRAA 41* 11/06/2014     Assessment and Plan  hiv disease = well controlled. She has ckd with GFR 40/Cr Cl 24 which precludes her from using TAF.  Hcv, chronic hepatitis without hepatic coma = will get  elastography  Palpitations = will refer for cardiac stress test. Clinical history does not suggest typical angina but may need further work up. Refer to cardiology

## 2014-11-21 ENCOUNTER — Other Ambulatory Visit: Payer: Self-pay | Admitting: Internal Medicine

## 2014-11-21 LAB — COMPLETE METABOLIC PANEL WITH GFR
ALT: 42 U/L — AB (ref 0–35)
AST: 37 U/L (ref 0–37)
Albumin: 4.2 g/dL (ref 3.5–5.2)
Alkaline Phosphatase: 77 U/L (ref 39–117)
BUN: 12 mg/dL (ref 6–23)
CO2: 24 mEq/L (ref 19–32)
Calcium: 9.5 mg/dL (ref 8.4–10.5)
Chloride: 104 mEq/L (ref 96–112)
Creat: 1.27 mg/dL — ABNORMAL HIGH (ref 0.50–1.10)
GFR, Est African American: 50 mL/min — ABNORMAL LOW
GFR, Est Non African American: 43 mL/min — ABNORMAL LOW
Glucose, Bld: 108 mg/dL — ABNORMAL HIGH (ref 70–99)
Potassium: 4.2 mEq/L (ref 3.5–5.3)
Sodium: 138 mEq/L (ref 135–145)
Total Bilirubin: 0.5 mg/dL (ref 0.2–1.2)
Total Protein: 8 g/dL (ref 6.0–8.3)

## 2014-11-21 LAB — CK: CK TOTAL: 105 U/L (ref 7–177)

## 2014-11-22 ENCOUNTER — Other Ambulatory Visit: Payer: Self-pay | Admitting: *Deleted

## 2014-11-22 DIAGNOSIS — R002 Palpitations: Secondary | ICD-10-CM

## 2014-12-13 ENCOUNTER — Ambulatory Visit (HOSPITAL_COMMUNITY)
Admission: RE | Admit: 2014-12-13 | Discharge: 2014-12-13 | Disposition: A | Discharge status: Home / Self Care | Payer: Medicare Other | Source: Ambulatory Visit | Attending: Internal Medicine | Admitting: Internal Medicine

## 2014-12-13 DIAGNOSIS — K824 Cholesterolosis of gallbladder: Secondary | ICD-10-CM | POA: Diagnosis not present

## 2014-12-13 DIAGNOSIS — B182 Chronic viral hepatitis C: Secondary | ICD-10-CM | POA: Diagnosis not present

## 2015-05-28 ENCOUNTER — Other Ambulatory Visit: Payer: Medicare Other

## 2015-05-28 DIAGNOSIS — B20 Human immunodeficiency virus [HIV] disease: Secondary | ICD-10-CM

## 2015-05-28 DIAGNOSIS — Z79899 Other long term (current) drug therapy: Secondary | ICD-10-CM

## 2015-05-28 DIAGNOSIS — Z113 Encounter for screening for infections with a predominantly sexual mode of transmission: Secondary | ICD-10-CM

## 2015-05-28 LAB — CBC WITH DIFFERENTIAL/PLATELET
Basophils Absolute: 0 10*3/uL (ref 0.0–0.1)
Basophils Relative: 0 % (ref 0–1)
Eosinophils Absolute: 0.1 10*3/uL (ref 0.0–0.7)
Eosinophils Relative: 1 % (ref 0–5)
HCT: 30.8 % — ABNORMAL LOW (ref 36.0–46.0)
Hemoglobin: 10.4 g/dL — ABNORMAL LOW (ref 12.0–15.0)
Lymphocytes Relative: 32 % (ref 12–46)
Lymphs Abs: 3.4 10*3/uL (ref 0.7–4.0)
MCH: 29.5 pg (ref 26.0–34.0)
MCHC: 33.8 g/dL (ref 30.0–36.0)
MCV: 87.3 fL (ref 78.0–100.0)
MPV: 10.8 fL (ref 8.6–12.4)
Monocytes Absolute: 0.7 10*3/uL (ref 0.1–1.0)
Monocytes Relative: 7 % (ref 3–12)
Neutro Abs: 6.3 10*3/uL (ref 1.7–7.7)
Neutrophils Relative %: 60 % (ref 43–77)
Platelets: 227 10*3/uL (ref 150–400)
RBC: 3.53 MIL/uL — ABNORMAL LOW (ref 3.87–5.11)
RDW: 13.6 % (ref 11.5–15.5)
WBC: 10.5 10*3/uL (ref 4.0–10.5)

## 2015-05-28 LAB — COMPLETE METABOLIC PANEL WITHOUT GFR
ALT: 39 U/L — ABNORMAL HIGH (ref 6–29)
AST: 31 U/L (ref 10–35)
Albumin: 3.9 g/dL (ref 3.6–5.1)
Alkaline Phosphatase: 63 U/L (ref 33–130)
BUN: 15 mg/dL (ref 7–25)
CO2: 17 mmol/L — ABNORMAL LOW (ref 20–31)
Calcium: 8.9 mg/dL (ref 8.6–10.4)
Chloride: 107 mmol/L (ref 98–110)
Creat: 1.75 mg/dL — ABNORMAL HIGH (ref 0.60–0.93)
GFR, Est African American: 34 mL/min — ABNORMAL LOW
GFR, Est Non African American: 29 mL/min — ABNORMAL LOW
Glucose, Bld: 97 mg/dL (ref 65–99)
Potassium: 3.9 mmol/L (ref 3.5–5.3)
Sodium: 138 mmol/L (ref 135–146)
Total Bilirubin: 0.5 mg/dL (ref 0.2–1.2)
Total Protein: 7.2 g/dL (ref 6.1–8.1)

## 2015-05-28 LAB — LIPID PANEL
Cholesterol: 129 mg/dL (ref 125–200)
HDL: 60 mg/dL
LDL Cholesterol: 38 mg/dL
Total CHOL/HDL Ratio: 2.2 ratio
Triglycerides: 157 mg/dL — ABNORMAL HIGH
VLDL: 31 mg/dL — ABNORMAL HIGH

## 2015-05-29 LAB — RPR

## 2015-05-29 LAB — HIV-1 RNA QUANT-NO REFLEX-BLD
HIV 1 RNA QUANT: 45 {copies}/mL — AB (ref <20)
HIV-1 RNA QUANT, LOG: 1.65 {Log_copies}/mL — AB (ref <1.30)

## 2015-05-29 LAB — T-HELPER CELL (CD4) - (RCID CLINIC ONLY)
CD4 T CELL HELPER: 36 % (ref 33–55)
CD4 T Cell Abs: 1180 /uL (ref 400–2700)

## 2015-06-17 ENCOUNTER — Ambulatory Visit (INDEPENDENT_AMBULATORY_CARE_PROVIDER_SITE_OTHER): Payer: Medicare Other | Admitting: Internal Medicine

## 2015-06-17 VITALS — BP 167/69 | HR 105 | Temp 97.8°F | Wt 97.0 lb

## 2015-06-17 DIAGNOSIS — N184 Chronic kidney disease, stage 4 (severe): Secondary | ICD-10-CM

## 2015-06-17 DIAGNOSIS — B20 Human immunodeficiency virus [HIV] disease: Secondary | ICD-10-CM

## 2015-06-17 DIAGNOSIS — R030 Elevated blood-pressure reading, without diagnosis of hypertension: Secondary | ICD-10-CM

## 2015-06-17 DIAGNOSIS — Z23 Encounter for immunization: Secondary | ICD-10-CM

## 2015-06-17 DIAGNOSIS — B182 Chronic viral hepatitis C: Secondary | ICD-10-CM | POA: Diagnosis present

## 2015-06-17 LAB — BASIC METABOLIC PANEL
BUN: 13 mg/dL (ref 7–25)
CALCIUM: 8.7 mg/dL (ref 8.6–10.4)
CHLORIDE: 106 mmol/L (ref 98–110)
CO2: 21 mmol/L (ref 20–31)
Creat: 1.51 mg/dL — ABNORMAL HIGH (ref 0.60–0.93)
GLUCOSE: 100 mg/dL — AB (ref 65–99)
POTASSIUM: 3.5 mmol/L (ref 3.5–5.3)
SODIUM: 135 mmol/L (ref 135–146)

## 2015-06-17 NOTE — Progress Notes (Signed)
Patient ID: Christine Dixon, female   DOB: [birth year], 69 y.o.   MRN: [id number]       Patient ID: Christine Dixon, female   DOB: [birth year], 69 y.o.   MRN: [id number]  HPI 1180/VL<45 on triumeq, ckd 4. Chronic hep c without hepatic coma, treatment naive. hcv geno 1a. 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 She does state that she found out that the [place] will provide inhome care for 3-4 hr per day for which she is looking forward to having help. Otherwise, she is in good health. No complaints  At last visit, she has elastography that showed F3-F4.  Outpatient Encounter Prescriptions as of 06/17/2015  Medication Sig  . TRIUMEQ 630-16-010 MG TABS TAKE 1 TABLET EVERY DAY   No facility-administered encounter medications on file as of 06/17/2015.     Patient Active Problem List   Diagnosis Date Noted  . Papanicolaou smear of cervix with low grade squamous intraepithelial lesion (LGSIL) 10/08/2012  . 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 HIV DISEASE 07/22/2006  . HEPATITIS C 07/22/2006  . ANEMIA OF CHRONIC DISEASE 07/22/2006  . DYSPLASIA, CERVIX, MILD 07/22/2006  . DYSPLASIA, VAGINA 07/22/2006  . OSTEOPOROSIS 07/22/2006     Health Maintenance Due  Topic Date Due  . TETANUS/TDAP  03/13/1964  . ZOSTAVAX  03/13/2005  . PNA vac Low Risk Adult (2 of 2 - PCV13) 04/13/2012  . MAMMOGRAM  09/28/2013  . COLONOSCOPY  10/22/2014  . INFLUENZA VACCINE  01/28/2015     Review of Systems  Constitutional: Negative for fever, chills, diaphoresis, activity change, appetite change, fatigue and unexpected weight change.  HENT: Negative for congestion, sore throat, rhinorrhea, sneezing, trouble swallowing and sinus pressure.  Eyes: Negative for photophobia and visual disturbance.  Respiratory: Negative for cough, chest tightness, shortness of breath, wheezing and stridor.  Cardiovascular: Negative for chest pain, palpitations and leg  swelling.  Gastrointestinal: Negative for nausea, vomiting, abdominal pain, diarrhea, constipation, blood in stool, abdominal distention and anal bleeding.  Genitourinary: Negative for dysuria, hematuria, flank pain and difficulty urinating.  Musculoskeletal: Negative for myalgias, back pain, joint swelling, arthralgias and gait problem.  Skin: Negative for color change, pallor, rash and wound.  Neurological: Negative for dizziness, tremors, weakness and light-headedness.  Hematological: Negative for adenopathy. Does not bruise/bleed easily.  Psychiatric/Behavioral: Negative for behavioral problems, confusion, sleep disturbance, dysphoric mood, decreased concentration and agitation.    Physical Exam   BP 167/69 mmHg  Pulse 105  Temp(Src) 97.8 F (36.6 C) (Oral)  Wt 97 lb (43.999 kg)  Constitutional:  oriented to person, place, and time. appears well-developed and well-nourished. No distress.  HENT: /AT, PERRLA, no scleral icterus Mouth/Throat: Oropharynx is clear and moist. No oropharyngeal exudate.  Cardiovascular: Normal rate, regular rhythm and normal heart sounds. Exam reveals no gallop and no friction rub.  No murmur heard.  Pulmonary/Chest: Effort normal and breath sounds normal. No respiratory distress.  has no wheezes.  Neck = supple, no nuchal rigidity Abdominal: Soft. Bowel sounds are normal.  exhibits no distension. There is no tenderness.  Lymphadenopathy: no cervical adenopathy. No axillary adenopathy Neurological: alert and oriented to person, place, and time.  Skin: Skin is warm and dry. No rash noted. No erythema.  Psychiatric: a normal mood and affect.  behavior is normal.   Lab Results  Component Value Date   CD4TCELL 36 05/28/2015   Lab Results  Component Value Date   CD4TABS  1180 05/28/2015   CD4TABS 1050 11/06/2014   CD4TABS 1170 06/12/2014   Lab Results  Component Value Date   HIV1RNAQUANT 45* 05/28/2015   Lab Results  Component Value Date    HEPBSAB No 08/23/2006   No results found for: RPR  CBC Lab Results  Component Value Date   WBC 10.5 05/28/2015   RBC 3.53* 05/28/2015   HGB 10.4* 05/28/2015   HCT 30.8* 05/28/2015   PLT 227 05/28/2015   MCV 87.3 05/28/2015   MCH 29.5 05/28/2015   MCHC 33.8 05/28/2015   RDW 13.6 05/28/2015   LYMPHSABS 3.4 05/28/2015   MONOABS 0.7 05/28/2015   EOSABS 0.1 05/28/2015   BASOSABS 0.0 05/28/2015   BMET Lab Results  Component Value Date   NA 138 05/28/2015   K 3.9 05/28/2015   CL 107 05/28/2015   CO2 17* 05/28/2015   GLUCOSE 97 05/28/2015   BUN 15 05/28/2015   CREATININE 1.75* 05/28/2015   CALCIUM 8.9 05/28/2015   GFRNONAA 29* 05/28/2015   GFRAA 34* 05/28/2015     Assessment and Plan  hiv disease= continue on triumeq  Pre-hypertension = elevated during this visit. 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 Will repeat bmp today  Chronic hepatitis c without hepatic coma = will get repeat hcv vl and ns5a screen to apply for zepatier  Health maintenance = will check hep b surface ab, will need to arrange for repeat pap

## 2015-06-18 LAB — HEPATITIS B SURFACE ANTIBODY,QUALITATIVE: HEP B S AB: POSITIVE — AB

## 2015-06-19 ENCOUNTER — Other Ambulatory Visit: Payer: Self-pay | Admitting: Internal Medicine

## 2015-06-19 DIAGNOSIS — B182 Chronic viral hepatitis C: Secondary | ICD-10-CM

## 2015-06-19 LAB — HEPATITIS C RNA QUANTITATIVE
HCV QUANT LOG: 4.64 {Log} — AB (ref <1.18)
HCV Quantitative: 44015 IU/mL — ABNORMAL HIGH (ref <15)

## 2015-06-19 MED ORDER — ELBASVIR-GRAZOPREVIR 50-100 MG PO TABS: 1.0000 | ORAL_TABLET | Freq: Every day | ORAL | Status: DC

## 2015-06-21 LAB — HCV RNA NS5A DRUG RESISTANCE

## 2015-07-04 ENCOUNTER — Encounter: Payer: Self-pay | Admitting: Internal Medicine

## 2015-07-05 MED FILL — *ZEPATIER 50-100 MG TABLET: 50-100 | 28 days supply | Qty: 28 | Fill #0

## 2015-07-11 ENCOUNTER — Encounter: Payer: Self-pay | Admitting: Pharmacy Technician

## 2015-08-01 ENCOUNTER — Ambulatory Visit (INDEPENDENT_AMBULATORY_CARE_PROVIDER_SITE_OTHER): Payer: Medicare Other | Admitting: Pharmacist Clinician (PhC)/ Clinical Pharmacy Specialist

## 2015-08-01 DIAGNOSIS — B182 Chronic viral hepatitis C: Secondary | ICD-10-CM

## 2015-08-01 MED FILL — *ZEPATIER 50-100 MG TABLET: 50-100 | 28 days supply | Qty: 28 | Fill #1

## 2015-08-01 NOTE — Patient Instructions (Signed)
Start Taking Zepatier today. Pick up the 2nd month supply next week and the 3rd refill in about 1 month.  Take Zepatier and Triumeq at the same time in the morning  Take the Centrum at night

## 2015-08-01 NOTE — Progress Notes (Signed)
HPI: Christine Dixon is a 70 y.o. female who is here for her hep C f/u with pharmacy visit.   Lab Results  Component Value Date   HCVGENOTYPE 1a 04/11/2013    Allergies: Allergies  Allergen Reactions  . Aspirin     REACTION: due to Crohn's  . Codeine     Vitals:    Past Medical History: Past Medical History  Diagnosis Date  . HIV infection   . Hypertension     Social History: Social History   Social History  . Marital Status: Married    Spouse Name: N/A  . Number of Children: N/A  . Years of Education: N/A   Social History Main Topics  . Smoking status: Former Smoker    Types: Cigarettes    Quit date: 06/29/1968  . Smokeless tobacco: Never Used  . Alcohol Use: No  . Drug Use: No  . Sexual Activity:    Partners: Male   Other Topics Concern  . Not on file   Social History Narrative    Labs: HIV 1 RNA QUANT (copies/mL)  Date Value  05/28/2015 45*  11/06/2014 <20  06/12/2014 131*   CD4 T CELL ABS (/uL)  Date Value  05/28/2015 1180  11/06/2014 1050  06/12/2014 1170   HEP B S AB (no units)  Date Value  06/17/2015 POS*   HEPATITIS B SURFACE AG (no units)  Date Value  08/23/2006 No   HCV AB (no units)  Date Value  08/23/2006 Yes    Lab Results  Component Value Date   HCVGENOTYPE 1a 04/11/2013    Hepatitis C RNA quantitative Latest Ref Rng 06/17/2015 07/04/2014 04/11/2013  HCV Quantitative <15 IU/mL 44015(H) 401447(H) 140985(H)  HCV Quantitative Log <1.18 log 10 4.64(H) 5.60(H) 5.15(H)    AST (U/L)  Date Value  05/28/2015 31  11/20/2014 37  11/06/2014 29   ALT (U/L)  Date Value  05/28/2015 39*  11/20/2014 42*  11/06/2014 34    CrCl: CrCl cannot be calculated (Unknown ideal weight.).  Fibrosis Score: F3/4 as assessed by [facility]  Child-Pugh Score: Class A  Previous Treatment Regimen: Naive  Assessment: Christine Dixon is here for her first pharmacy visit for hep C. She was supposed to start Zepatier on 1/11 but she never started  it due to stress and fear. She wanted to discuss the therapy first before starting. I told about the fear of the prior authorization lapsing. She is Triumeq, ramipril, and MVI. I told her to start the Zepatier today and she is going to do so. She is going to separate out the zepatier/Triumed from her MVI. She is going to call the pharmacy for the 2nd mo refill tomorrow so the PA won't lapse and call for the last month refill in 28 days. We are going to bring her back in 1 mo for labs and see Dr. Baxter Dixon a week later. All questions were answered.   Recommendations:  Start Zepatier 1 PO qday today Cont Triumeq 1 PO qday Labs in 1 month F/u with Dr Christine Dixon Appts are made  Christine Dixon [place], [place].D., BCPS, AAHIVP Clinical Infectious [place] [facility] 08/01/2015, 2:20 PM

## 2015-08-28 ENCOUNTER — Other Ambulatory Visit: Payer: Medicare Other

## 2015-08-28 DIAGNOSIS — B20 Human immunodeficiency virus [HIV] disease: Secondary | ICD-10-CM

## 2015-08-28 DIAGNOSIS — B182 Chronic viral hepatitis C: Secondary | ICD-10-CM

## 2015-08-28 LAB — COMPREHENSIVE METABOLIC PANEL
ALK PHOS: 60 U/L (ref 33–130)
ALT: 27 U/L (ref 6–29)
AST: 28 U/L (ref 10–35)
Albumin: 4.3 g/dL (ref 3.6–5.1)
BUN: 17 mg/dL (ref 7–25)
CO2: 18 mmol/L — ABNORMAL LOW (ref 20–31)
CREATININE: 1.57 mg/dL — AB (ref 0.60–0.93)
Calcium: 8.7 mg/dL (ref 8.6–10.4)
Chloride: 107 mmol/L (ref 98–110)
GLUCOSE: 95 mg/dL (ref 65–99)
POTASSIUM: 4 mmol/L (ref 3.5–5.3)
SODIUM: 134 mmol/L — AB (ref 135–146)
TOTAL PROTEIN: 7.9 g/dL (ref 6.1–8.1)
Total Bilirubin: 0.4 mg/dL (ref 0.2–1.2)

## 2015-08-28 LAB — CBC WITH DIFFERENTIAL/PLATELET
BASOS ABS: 0 10*3/uL (ref 0.0–0.1)
BASOS PCT: 0 % (ref 0–1)
EOS ABS: 0.1 10*3/uL (ref 0.0–0.7)
EOS PCT: 1 % (ref 0–5)
HCT: 29.5 % — ABNORMAL LOW (ref 36.0–46.0)
Hemoglobin: 9.7 g/dL — ABNORMAL LOW (ref 12.0–15.0)
LYMPHS ABS: 3.6 10*3/uL (ref 0.7–4.0)
Lymphocytes Relative: 32 % (ref 12–46)
MCH: 28.7 pg (ref 26.0–34.0)
MCHC: 32.9 g/dL (ref 30.0–36.0)
MCV: 87.3 fL (ref 78.0–100.0)
MPV: 10.7 fL (ref 8.6–12.4)
Monocytes Absolute: 0.9 10*3/uL (ref 0.1–1.0)
Monocytes Relative: 8 % (ref 3–12)
NEUTROS PCT: 59 % (ref 43–77)
Neutro Abs: 6.6 10*3/uL (ref 1.7–7.7)
PLATELETS: 235 10*3/uL (ref 150–400)
RBC: 3.38 MIL/uL — AB (ref 3.87–5.11)
RDW: 14.7 % (ref 11.5–15.5)
WBC: 11.2 10*3/uL — AB (ref 4.0–10.5)

## 2015-08-28 MED FILL — *ZEPATIER 50-100 MG TABLET: 50-100 | 28 days supply | Qty: 28 | Fill #2

## 2015-08-29 LAB — HIV-1 RNA QUANT-NO REFLEX-BLD

## 2015-08-29 LAB — HEPATITIS C RNA QUANTITATIVE: HCV QUANT: NOT DETECTED [IU]/mL (ref <15)

## 2015-08-29 LAB — T-HELPER CELL (CD4) - (RCID CLINIC ONLY)
CD4 T CELL HELPER: 38 % (ref 33–55)
CD4 T Cell Abs: 1340 /uL (ref 400–2700)

## 2015-09-09 ENCOUNTER — Ambulatory Visit (INDEPENDENT_AMBULATORY_CARE_PROVIDER_SITE_OTHER): Payer: Medicare Other | Admitting: Internal Medicine

## 2015-09-09 ENCOUNTER — Encounter: Payer: Self-pay | Admitting: Internal Medicine

## 2015-09-09 VITALS — BP 128/72 | HR 102 | Temp 97.1°F | Wt 96.0 lb

## 2015-09-09 DIAGNOSIS — I10 Essential (primary) hypertension: Secondary | ICD-10-CM | POA: Diagnosis not present

## 2015-09-09 DIAGNOSIS — N184 Chronic kidney disease, stage 4 (severe): Secondary | ICD-10-CM | POA: Diagnosis not present

## 2015-09-09 DIAGNOSIS — B182 Chronic viral hepatitis C: Secondary | ICD-10-CM | POA: Diagnosis not present

## 2015-09-09 DIAGNOSIS — B20 Human immunodeficiency virus [HIV] disease: Secondary | ICD-10-CM | POA: Diagnosis present

## 2015-09-09 MED ORDER — RILPIVIRINE HCL 25 MG PO TABS: 25.0000 mg | ORAL_TABLET | Freq: Every day | ORAL | Status: DC

## 2015-09-09 MED ORDER — LAMIVUDINE 100 MG PO TABS: 100.0000 mg | ORAL_TABLET | Freq: Every day | ORAL | Status: DC

## 2015-09-09 MED ORDER — AMLODIPINE BESYLATE 2.5 MG PO TABS: 2.5000 mg | ORAL_TABLET | Freq: Every day | ORAL | Status: DC

## 2015-09-09 MED ORDER — DOLUTEGRAVIR SODIUM 50 MG PO TABS: 50.0000 mg | ORAL_TABLET | Freq: Every day | ORAL | Status: DC

## 2015-09-09 MED FILL — EDURANT 25 MG TABS: 25 | 30 days supply | Qty: 30 | Fill #0

## 2015-09-09 MED FILL — AMLODIPINE BESYLATE 2.5 MG: 2.5 | 30 days supply | Qty: 30 | Fill #0

## 2015-09-09 MED FILL — lamiVUDine 100 MG TABS: 100 | 30 days supply | Qty: 30 | Fill #0

## 2015-09-09 NOTE — Progress Notes (Signed)
Patient ID: JASELYN NAHM, female   DOB: Sep 07, 1945, 70 y.o.   MRN: [id number] HPI: ADILYNN BESSEY is a 70 y.o. female who is here for her routine HIV/hep C f/u.   Allergies: Allergies  Allergen Reactions  . Aspirin     REACTION: due to Crohn's  . Codeine     Vitals: Temp: 97.1 F (36.2 C) (03/13 1522) Temp Source: Oral (03/13 1522) BP: 128/72 mmHg (03/13 1522) Pulse Rate: 102 (03/13 1522)  Past Medical History: Past Medical History  Diagnosis Date  . HIV infection ([place])   . Hypertension     Social History: Social History   Social History  . Marital Status: Married    Spouse Name: N/A  . Number of Children: N/A  . Years of Education: N/A   Social History Main Topics  . Smoking status: Former Smoker    Types: Cigarettes    Quit date: 06/29/1968  . Smokeless tobacco: Never Used  . Alcohol Use: No  . Drug Use: No  . Sexual Activity:    Partners: Male   Other Topics Concern  . None   Social History Narrative    Previous Regimen:   Current Regimen: Triumeq  Labs: HIV 1 RNA QUANT (copies/mL)  Date Value  08/28/2015 <20  05/28/2015 45*  11/06/2014 <20   CD4 T CELL ABS (/uL)  Date Value  08/28/2015 1340  05/28/2015 1180  11/06/2014 1050   HEP B S AB (no units)  Date Value  06/17/2015 POS*   HEPATITIS B SURFACE AG (no units)  Date Value  08/23/2006 No   HCV AB (no units)  Date Value  08/23/2006 Yes    CrCl: CrCl cannot be calculated (Unknown ideal weight.).  Lipids:    Component Value Date/Time   CHOL 129 05/28/2015 1129   TRIG 157* 05/28/2015 1129   HDL 60 05/28/2015 1129   CHOLHDL 2.2 05/28/2015 1129   VLDL 31* 05/28/2015 1129   LDLCALC 38 05/28/2015 1129    Assessment: Sequita is here for her routine f/u of both hep C and HIV. She is doing well on her Zepatier. The hep C VL is undetectable so far. She is about to be on her 3rd mo. She has stopped taking her Triumeq due to some CNS side effects. It could also be due to her  ramipril also since she is a tiny lady. She is on ramipril 21m qday even though he has a baseline scr 1.5-1.7. We are going to change her to low dose norvasc instead. Switch the ART to RPV/DTG/3TC. Made her a new med calendar.   Recommendations:  Dc Triumeq, lisinopril Start Tivicay 59mPO qday Start 3TC 10089mO qday Start RPV 30m63m qday Start Norvasc 2.5mg 40mqday  Darral Rishel,Wilfred LacyrmD Clinical [facility] 09/09/2015, 4:22 PM

## 2015-09-09 NOTE — Progress Notes (Signed)
Patient ID: Christine Dixon, female   DOB: December 28, 1945, 70 y.o.   MRN: [id number]       Patient ID: Christine Dixon, female   DOB: 06-08-1946, 70 y.o.   MRN: [id number]  HPI  70yo F with long standing hiv disease- hcv, geno 1a.  Outpatient Encounter Prescriptions as of 09/09/2015  Medication Sig  . Elbasvir-Grazoprevir (ZEPATIER) 50-100 MG TABS Take 1 tablet by mouth daily. (Patient not taking: Reported on 08/01/2015)  . multivitamin-iron-minerals-folic acid (CENTRUM) chewable tablet Chew 1 tablet by mouth daily. Reported on 09/09/2015  . ramipril (ALTACE) 10 MG capsule Take 10 mg by mouth daily. Reported on 09/09/2015  . TRIUMEQ 945-85-929 MG TABS TAKE 1 TABLET EVERY DAY (Patient not taking: Reported on 09/09/2015)   No facility-administered encounter medications on file as of 09/09/2015.     Patient Active Problem List   Diagnosis Date Noted  . Papanicolaou smear of cervix with low grade squamous intraepithelial lesion (LGSIL) 10/08/2012  . 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 HIV DISEASE 07/22/2006  . HEPATITIS C 07/22/2006  . ANEMIA OF CHRONIC DISEASE 07/22/2006  . DYSPLASIA, CERVIX, MILD 07/22/2006  . DYSPLASIA, VAGINA 07/22/2006  . OSTEOPOROSIS 07/22/2006     Health Maintenance Due  Topic Date Due  . TETANUS/TDAP  03/13/1964  . ZOSTAVAX  03/13/2005  . PNA vac Low Risk Adult (2 of 2 - PCV13) 04/13/2012  . MAMMOGRAM  09/28/2013  . COLONOSCOPY  10/22/2014     Review of Systems  Physical Exam   BP 128/72 mmHg  Pulse 102  Temp(Src) 97.1 F (36.2 C) (Oral)  Wt 96 lb (43.545 kg)  Lab Results  Component Value Date   CD4TCELL 38 08/28/2015   Lab Results  Component Value Date   CD4TABS 1340 08/28/2015   CD4TABS 1180 05/28/2015   CD4TABS 1050 11/06/2014   Lab Results  Component Value Date   HIV1RNAQUANT <20 08/28/2015   Lab Results  Component Value Date   HEPBSAB POS* 06/17/2015   No results found for: RPR  CBC Lab Results  Component Value Date   WBC  11.2* 08/28/2015   RBC 3.38* 08/28/2015   HGB 9.7* 08/28/2015   HCT 29.5* 08/28/2015   PLT 235 08/28/2015   MCV 87.3 08/28/2015   MCH 28.7 08/28/2015   MCHC 32.9 08/28/2015   RDW 14.7 08/28/2015   LYMPHSABS 3.6 08/28/2015   MONOABS 0.9 08/28/2015   EOSABS 0.1 08/28/2015   BASOSABS 0.0 08/28/2015   BMET Lab Results  Component Value Date   NA 134* 08/28/2015   K 4.0 08/28/2015   CL 107 08/28/2015   CO2 18* 08/28/2015   GLUCOSE 95 08/28/2015   BUN 17 08/28/2015   CREATININE 1.57* 08/28/2015   CALCIUM 8.7 08/28/2015   GFRNONAA 29* 05/28/2015   GFRAA 34* 05/28/2015     Assessment and Plan  hiv disease = will change her to different regimen since she is intolerant of triumeq. We will dispense Tivicay 24m, lamividine 1052m rilpivirine 251mnd provided education on adherence and side effects  ckd 4 = crcl 22, appears stable  htn = she is at goal though her ckd is at borderline for rampril. We will change it to amlodipine. Due to her c/o lightheadedness, we will try low dose at 2.5mg86men titrate up   Chronic hepatitis c without hepatic coma = currently on her 3rd month of zepatier. Finish last month of treatment.

## 2015-09-10 MED FILL — TIVICAY 50 MG TABLET: 50 | 30 days supply | Qty: 30 | Fill #0

## 2015-10-17 MED FILL — TIVICAY 50 MG TABLET: 50 | 30 days supply | Qty: 30 | Fill #1

## 2015-10-17 MED FILL — EDURANT 25 MG TABS: 25 | 30 days supply | Qty: 30 | Fill #1

## 2015-10-17 MED FILL — AMLODIPINE BESYLATE 2.5 MG: 2.5 | 30 days supply | Qty: 30 | Fill #1

## 2015-10-17 MED FILL — lamiVUDine 100 MG TABS: 100 | 30 days supply | Qty: 30 | Fill #1

## 2015-12-16 MED FILL — AMLODIPINE BESYLATE 2.5 MG: 2.5 | 30 days supply | Qty: 30 | Fill #2

## 2016-01-21 MED FILL — AMLODIPINE BESYLATE 2.5 MG: 2.5 | 30 days supply | Qty: 30 | Fill #3

## 2016-01-21 MED FILL — lamiVUDine 100 MG TABS: 100 | 30 days supply | Qty: 30 | Fill #2

## 2016-01-21 MED FILL — EDURANT 25 MG TABS: 25 | 30 days supply | Qty: 30 | Fill #2

## 2016-01-21 MED FILL — TIVICAY 50 MG TABLET: 50 | 30 days supply | Qty: 30 | Fill #2

## 2016-02-28 MED FILL — TIVICAY 50 MG TABLET: 50 | 30 days supply | Qty: 30 | Fill #3

## 2016-02-28 MED FILL — AMLODIPINE BESYLATE 2.5 MG: 2.5 | 30 days supply | Qty: 30 | Fill #4

## 2016-02-28 MED FILL — lamiVUDine 100 MG TABS: 100 | 30 days supply | Qty: 30 | Fill #3

## 2016-02-28 MED FILL — EDURANT 25 MG TABS: 25 | 30 days supply | Qty: 30 | Fill #3

## 2016-04-02 MED FILL — EDURANT 25 MG TABS: 25 | 30 days supply | Qty: 30 | Fill #4

## 2016-04-02 MED FILL — TIVICAY 50 MG TABLET: 50 | 30 days supply | Qty: 30 | Fill #4

## 2016-04-02 MED FILL — lamiVUDine 100 MG TABS: 100 | 30 days supply | Qty: 30 | Fill #4

## 2016-04-06 MED FILL — AMLODIPINE BESYLATE 2.5 MG: 2.5 | 30 days supply | Qty: 30 | Fill #5

## 2016-04-27 ENCOUNTER — Other Ambulatory Visit: Payer: Medicare Other

## 2016-04-27 DIAGNOSIS — B20 Human immunodeficiency virus [HIV] disease: Secondary | ICD-10-CM

## 2016-04-27 LAB — CBC WITH DIFFERENTIAL/PLATELET
BASOS PCT: 0 %
Basophils Absolute: 0 cells/uL (ref 0–200)
EOS ABS: 188 {cells}/uL (ref 15–500)
EOS PCT: 2 %
HCT: 36.6 % (ref 35.0–45.0)
Hemoglobin: 12.1 g/dL (ref 11.7–15.5)
Lymphocytes Relative: 35 %
Lymphs Abs: 3290 cells/uL (ref 850–3900)
MCH: 28.2 pg (ref 27.0–33.0)
MCHC: 33.1 g/dL (ref 32.0–36.0)
MCV: 85.3 fL (ref 80.0–100.0)
MONOS PCT: 10 %
MPV: 11.2 fL (ref 7.5–12.5)
Monocytes Absolute: 940 cells/uL (ref 200–950)
NEUTROS ABS: 4982 {cells}/uL (ref 1500–7800)
Neutrophils Relative %: 53 %
PLATELETS: 186 10*3/uL (ref 140–400)
RBC: 4.29 MIL/uL (ref 3.80–5.10)
RDW: 14.1 % (ref 11.0–15.0)
WBC: 9.4 10*3/uL (ref 3.8–10.8)

## 2016-04-27 LAB — COMPLETE METABOLIC PANEL WITH GFR
ALT: 27 U/L (ref 6–29)
AST: 26 U/L (ref 10–35)
Albumin: 4.4 g/dL (ref 3.6–5.1)
Alkaline Phosphatase: 63 U/L (ref 33–130)
BUN: 15 mg/dL (ref 7–25)
CALCIUM: 9.3 mg/dL (ref 8.6–10.4)
CHLORIDE: 108 mmol/L (ref 98–110)
CO2: 19 mmol/L — AB (ref 20–31)
Creat: 1.53 mg/dL — ABNORMAL HIGH (ref 0.60–0.93)
GFR, Est African American: 39 mL/min — ABNORMAL LOW (ref >=60)
GFR, Est Non African American: 34 mL/min — ABNORMAL LOW (ref >=60)
GLUCOSE: 106 mg/dL — AB (ref 65–99)
POTASSIUM: 3.9 mmol/L (ref 3.5–5.3)
SODIUM: 140 mmol/L (ref 135–146)
Total Bilirubin: 0.4 mg/dL (ref 0.2–1.2)
Total Protein: 8.1 g/dL (ref 6.1–8.1)

## 2016-04-28 LAB — HIV-1 RNA QUANT-NO REFLEX-BLD: HIV-1 RNA Quant, Log: <1.3 Log copies/mL (ref <1.30)

## 2016-04-28 LAB — T-HELPER CELL (CD4) - (RCID CLINIC ONLY)
CD4 % Helper T Cell: 25 % — ABNORMAL LOW (ref 33–55)
CD4 T Cell Abs: 850 /uL (ref 400–2700)

## 2016-04-30 MED FILL — EDURANT 25 MG TABS: 25 | 30 days supply | Qty: 30 | Fill #5

## 2016-04-30 MED FILL — lamiVUDine 100 MG TABS: 100 | 30 days supply | Qty: 30 | Fill #5

## 2016-04-30 MED FILL — TIVICAY 50 MG TABLET: 50 | 30 days supply | Qty: 30 | Fill #5

## 2016-05-11 ENCOUNTER — Encounter: Payer: Self-pay | Admitting: Internal Medicine

## 2016-05-11 ENCOUNTER — Other Ambulatory Visit: Payer: Self-pay | Admitting: Internal Medicine

## 2016-05-11 ENCOUNTER — Ambulatory Visit (INDEPENDENT_AMBULATORY_CARE_PROVIDER_SITE_OTHER): Payer: Medicare Other | Admitting: Internal Medicine

## 2016-05-11 VITALS — BP 166/76 | HR 94 | Temp 98.3°F | Ht 60.0 in | Wt 97.0 lb

## 2016-05-11 DIAGNOSIS — B182 Chronic viral hepatitis C: Secondary | ICD-10-CM | POA: Diagnosis present

## 2016-05-11 DIAGNOSIS — Z1231 Encounter for screening mammogram for malignant neoplasm of breast: Secondary | ICD-10-CM

## 2016-05-11 DIAGNOSIS — Z0000 Encounter for general adult medical examination without abnormal findings: Secondary | ICD-10-CM | POA: Diagnosis not present

## 2016-05-11 NOTE — Patient Instructions (Signed)
Please have the mammography center call us for an order.

## 2016-05-11 NOTE — Progress Notes (Signed)
RFV: hiv disease, and chronic hep c follow up  Patient ID: Christine Dixon, female   DOB: 1945/08/17, 70 y.o.   MRN: [id number]  HPI  70yo F with hiv disease-HCV coinfection, CD 4 count of 850/VL<20, on tivicay-Lamivudine-RPV, finished her 3 month course of zepatier in march 2017.  She states that in the last week she had 2 episodes where she had sudden onset  Recently given lexapro for panic disorder Outpatient Encounter Prescriptions as of 05/11/2016  Medication Sig  . amLODipine (NORVASC) 2.5 MG tablet Take 1 tablet (2.5 mg total) by mouth daily.  . dolutegravir (TIVICAY) 50 MG tablet Take 1 tablet (50 mg total) by mouth daily.  . Elbasvir-Grazoprevir (ZEPATIER) 50-100 MG TABS Take 1 tablet by mouth daily. (Patient not taking: Reported on 08/01/2015)  . lamivudine (EPIVIR) 100 MG tablet Take 1 tablet (100 mg total) by mouth daily.  . multivitamin-iron-minerals-folic acid (CENTRUM) chewable tablet Chew 1 tablet by mouth daily. Reported on 09/09/2015  . rilpivirine (EDURANT) 25 MG TABS tablet Take 1 tablet (25 mg total) by mouth daily with breakfast.   No facility-administered encounter medications on file as of 05/11/2016.      Patient Active Problem List   Diagnosis Date Noted  . Papanicolaou smear of cervix with low grade squamous intraepithelial lesion (LGSIL) 10/08/2012  . 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 HIV DISEASE 07/22/2006  . HEPATITIS C 07/22/2006  . ANEMIA OF CHRONIC DISEASE 07/22/2006  . DYSPLASIA, CERVIX, MILD 07/22/2006  . DYSPLASIA, VAGINA 07/22/2006  . OSTEOPOROSIS 07/22/2006     Health Maintenance Due  Topic Date Due  . TETANUS/TDAP  03/13/1964  . ZOSTAVAX  03/13/2005  . PNA vac Low Risk Adult (2 of 2 - PCV13) 04/13/2012  . MAMMOGRAM  09/28/2013  . COLONOSCOPY  10/22/2014  . INFLUENZA VACCINE  01/28/2016     Review of Systems  Constitutional: Negative for fever, chills, diaphoresis, activity change, appetite change, fatigue and  unexpected weight change.  HENT: Negative for congestion, sore throat, rhinorrhea, sneezing, trouble swallowing and sinus pressure.  Eyes: Negative for photophobia and visual disturbance.  Respiratory: Negative for cough, chest tightness, shortness of breath, wheezing and stridor.  Cardiovascular: Negative for chest pain, palpitations and leg swelling.  Gastrointestinal: Negative for nausea, vomiting, abdominal pain, diarrhea, constipation, blood in stool, abdominal distention and anal bleeding.  Genitourinary: Negative for dysuria, hematuria, flank pain and difficulty urinating.  Musculoskeletal: Negative for myalgias, back pain, joint swelling, arthralgias and gait problem.  Skin: Negative for color change, pallor, rash and wound.  Neurological: Negative for dizziness, tremors, weakness and light-headedness.  Hematological: Negative for adenopathy. Does not bruise/bleed easily.  Psychiatric/Behavioral: Negative for behavioral problems, confusion, sleep disturbance, dysphoric mood, decreased concentration and agitation.    Physical Exam   BP (!) 166/76   Pulse 94   Temp 98.3 F (36.8 C) (Oral)   Ht 5' (1.524 m)   Wt 97 lb (44 kg)   BMI 18.94 kg/m  Physical Exam  Constitutional:  oriented to person, place, and time. appears well-developed and well-nourished. No distress.  HENT: [place]/AT, PERRLA, no scleral icterus Mouth/Throat: Oropharynx is clear and moist. No oropharyngeal exudate.  Cardiovascular: Normal rate, regular rhythm and normal heart sounds. Exam reveals no gallop and no friction rub.  No murmur heard.  Pulmonary/Chest: Effort normal and breath sounds normal. No respiratory distress.  has no wheezes.  Neck = supple, no nuchal rigidity Abdominal: Soft. Bowel sounds are normal.  exhibits no distension. There is  no tenderness.  Lymphadenopathy: no cervical adenopathy. No axillary adenopathy Neurological: alert and oriented to person, place, and time.  Skin: Skin is warm and  dry. No rash noted. No erythema.  Psychiatric: a normal mood and affect.  behavior is normal.    Lab Results  Component Value Date   CD4TCELL 25 (L) 04/27/2016   Lab Results  Component Value Date   CD4TABS 850 04/27/2016   CD4TABS 1,340 08/28/2015   CD4TABS 1,180 05/28/2015   Lab Results  Component Value Date   HIV1RNAQUANT <20 04/27/2016   Lab Results  Component Value Date   HEPBSAB POS (A) 06/17/2015   No results found for: RPR  CBC Lab Results  Component Value Date   WBC 9.4 04/27/2016   RBC 4.29 04/27/2016   HGB 12.1 04/27/2016   HCT 36.6 04/27/2016   PLT 186 04/27/2016   MCV 85.3 04/27/2016   MCH 28.2 04/27/2016   MCHC 33.1 04/27/2016   RDW 14.1 04/27/2016   LYMPHSABS 3,290 04/27/2016   MONOABS 940 04/27/2016   EOSABS 188 04/27/2016   BASOSABS 0 04/27/2016   BMET Lab Results  Component Value Date   NA 140 04/27/2016   K 3.9 04/27/2016   CL 108 04/27/2016   CO2 19 (L) 04/27/2016   GLUCOSE 106 (H) 04/27/2016   BUN 15 04/27/2016   CREATININE 1.53 (H) 04/27/2016   CALCIUM 9.3 04/27/2016   GFRNONAA 34 (L) 04/27/2016   GFRAA 39 (L) 04/27/2016     Assessment and Plan  HIV = well controlled. Can consider her to take new medication next year in ealry 2018 which will be tivicay/rilpivirine.  Health screening = mammogram?  Chronic hepatitis C without hepatic coma = will need to check SVR 12(delayed). Will also need to do RUQ U/S for [facility] surveillance. Last screen in 11/2014  HTN = continue on low dose amlodipine

## 2016-05-13 LAB — HEPATITIS C RNA QUANTITATIVE: HCV Quantitative: NOT DETECTED IU/mL (ref <15)

## 2016-05-26 ENCOUNTER — Ambulatory Visit
Admission: RE | Admit: 2016-05-26 | Discharge: 2016-05-26 | Disposition: A | Discharge status: Home / Self Care | Payer: Medicare Other | Source: Ambulatory Visit | Attending: Internal Medicine | Admitting: Internal Medicine

## 2016-05-26 DIAGNOSIS — B182 Chronic viral hepatitis C: Secondary | ICD-10-CM

## 2016-06-01 MED FILL — AMLODIPINE BESYLATE 2.5 MG: 2.5 | 30 days supply | Qty: 30 | Fill #6

## 2016-06-08 ENCOUNTER — Ambulatory Visit: Payer: Medicare Other

## 2016-07-01 ENCOUNTER — Ambulatory Visit
Admission: RE | Admit: 2016-07-01 | Discharge: 2016-07-01 | Disposition: A | Discharge status: Home / Self Care | Payer: Medicare Other | Source: Ambulatory Visit | Attending: Internal Medicine | Admitting: Internal Medicine

## 2016-07-01 DIAGNOSIS — Z1231 Encounter for screening mammogram for malignant neoplasm of breast: Secondary | ICD-10-CM

## 2016-07-06 ENCOUNTER — Other Ambulatory Visit: Payer: Self-pay | Admitting: Internal Medicine

## 2016-07-06 DIAGNOSIS — R928 Other abnormal and inconclusive findings on diagnostic imaging of breast: Secondary | ICD-10-CM

## 2016-07-13 ENCOUNTER — Other Ambulatory Visit: Payer: Self-pay | Admitting: Internal Medicine

## 2016-07-13 ENCOUNTER — Ambulatory Visit
Admission: RE | Admit: 2016-07-13 | Discharge: 2016-07-13 | Disposition: A | Discharge status: Home / Self Care | Payer: Medicare Other | Source: Ambulatory Visit | Attending: Internal Medicine | Admitting: Internal Medicine

## 2016-07-13 DIAGNOSIS — R2231 Localized swelling, mass and lump, right upper limb: Secondary | ICD-10-CM

## 2016-07-13 DIAGNOSIS — R928 Other abnormal and inconclusive findings on diagnostic imaging of breast: Secondary | ICD-10-CM

## 2016-07-13 MED FILL — AMLODIPINE BESYLATE 2.5 MG: 2.5 | 30 days supply | Qty: 30 | Fill #7 | Status: TO

## 2016-07-13 MED FILL — EDURANT 25 MG TABS: 25 | 30 days supply | Qty: 30 | Fill #6 | Status: TO

## 2016-07-13 MED FILL — lamiVUDine 100 MG TABS: 100 | 30 days supply | Qty: 30 | Fill #6 | Status: TO

## 2016-07-13 MED FILL — TIVICAY 50 MG TABLET: 50 | 30 days supply | Qty: 30 | Fill #6 | Status: TO

## 2016-08-24 MED FILL — lamiVUDine 100 MG TABS: 100 | 30 days supply | Qty: 30 | Fill #0

## 2016-08-24 MED FILL — AMLODIPINE BESYLATE 2.5 MG: 2.5 | 30 days supply | Qty: 30 | Fill #0

## 2016-08-24 MED FILL — TIVICAY 50 MG TABLET: 50 | 30 days supply | Qty: 30 | Fill #0

## 2016-08-24 MED FILL — EDURANT 25 MG TABS: 25 | 30 days supply | Qty: 30 | Fill #0

## 2016-09-09 ENCOUNTER — Other Ambulatory Visit: Payer: Self-pay | Admitting: Pharmacist

## 2016-09-09 DIAGNOSIS — B20 Human immunodeficiency virus [HIV] disease: Secondary | ICD-10-CM

## 2016-09-09 DIAGNOSIS — I10 Essential (primary) hypertension: Secondary | ICD-10-CM

## 2016-09-09 MED ORDER — AMLODIPINE BESYLATE 2.5 MG PO TABS: 2.5000 mg | ORAL_TABLET | Freq: Every day | ORAL | 11 refills | Status: DC

## 2016-09-09 MED ORDER — RILPIVIRINE HCL 25 MG PO TABS: 25.0000 mg | ORAL_TABLET | Freq: Every day | ORAL | 11 refills | Status: DC

## 2016-09-09 MED ORDER — DOLUTEGRAVIR SODIUM 50 MG PO TABS: 50.0000 mg | ORAL_TABLET | Freq: Every day | ORAL | 11 refills | Status: DC

## 2016-09-09 MED ORDER — LAMIVUDINE 100 MG PO TABS: 100.0000 mg | ORAL_TABLET | Freq: Every day | ORAL | 11 refills | Status: DC

## 2016-10-08 MED FILL — AMLODIPINE BESYLATE 2.5 MG: 2.5 | 30 days supply | Qty: 30 | Fill #0

## 2016-10-08 MED FILL — lamiVUDine 100 MG TABS: 100 | 30 days supply | Qty: 30 | Fill #0

## 2016-10-08 MED FILL — TIVICAY 50 MG TABLET: 50 | 30 days supply | Qty: 30 | Fill #0

## 2016-10-08 MED FILL — EDURANT 25 MG TABS: 25 | 30 days supply | Qty: 30 | Fill #0

## 2016-11-19 MED FILL — lamiVUDine 100 MG TABS: 100 | 30 days supply | Qty: 30 | Fill #1

## 2016-11-19 MED FILL — AMLODIPINE BESYLATE 2.5 MG: 2.5 | 30 days supply | Qty: 30 | Fill #1

## 2016-11-19 MED FILL — TIVICAY 50 MG TABLET: 50 | 30 days supply | Qty: 30 | Fill #1

## 2016-11-19 MED FILL — EDURANT 25 MG TABS: 25 | 30 days supply | Qty: 30 | Fill #1

## 2016-12-22 MED FILL — TIVICAY 50 MG TABLET: 50 | 30 days supply | Qty: 30 | Fill #2

## 2016-12-22 MED FILL — lamiVUDine 100 MG TABS: 100 | 30 days supply | Qty: 30 | Fill #2

## 2016-12-22 MED FILL — AMLODIPINE BESYLATE 2.5 MG: 2.5 | 30 days supply | Qty: 30 | Fill #2

## 2016-12-22 MED FILL — EDURANT 25 MG TABS: 25 | 30 days supply | Qty: 30 | Fill #2

## 2017-01-12 ENCOUNTER — Other Ambulatory Visit: Payer: Medicare Other

## 2017-01-12 DIAGNOSIS — Z113 Encounter for screening for infections with a predominantly sexual mode of transmission: Secondary | ICD-10-CM

## 2017-01-12 DIAGNOSIS — B20 Human immunodeficiency virus [HIV] disease: Secondary | ICD-10-CM

## 2017-01-12 DIAGNOSIS — Z79899 Other long term (current) drug therapy: Secondary | ICD-10-CM

## 2017-01-12 LAB — CBC WITH DIFFERENTIAL/PLATELET
BASOS PCT: 0 %
Basophils Absolute: 0 cells/uL (ref 0–200)
EOS PCT: 1 %
Eosinophils Absolute: 103 cells/uL (ref 15–500)
HEMATOCRIT: 36.9 % (ref 35.0–45.0)
HEMOGLOBIN: 12.2 g/dL (ref 11.7–15.5)
LYMPHS ABS: 3708 {cells}/uL (ref 850–3900)
Lymphocytes Relative: 36 %
MCH: 28.8 pg (ref 27.0–33.0)
MCHC: 33.1 g/dL (ref 32.0–36.0)
MCV: 87 fL (ref 80.0–100.0)
MONO ABS: 824 {cells}/uL (ref 200–950)
MPV: 11.1 fL (ref 7.5–12.5)
Monocytes Relative: 8 %
Neutro Abs: 5665 cells/uL (ref 1500–7800)
Neutrophils Relative %: 55 %
Platelets: 187 10*3/uL (ref 140–400)
RBC: 4.24 MIL/uL (ref 3.80–5.10)
RDW: 14.2 % (ref 11.0–15.0)
WBC: 10.3 10*3/uL (ref 3.8–10.8)

## 2017-01-12 LAB — COMPREHENSIVE METABOLIC PANEL
ALBUMIN: 4.3 g/dL (ref 3.6–5.1)
ALK PHOS: 70 U/L (ref 33–130)
ALT: 31 U/L — AB (ref 6–29)
AST: 31 U/L (ref 10–35)
BILIRUBIN TOTAL: 0.4 mg/dL (ref 0.2–1.2)
BUN: 14 mg/dL (ref 7–25)
CALCIUM: 9.5 mg/dL (ref 8.6–10.4)
CO2: 21 mmol/L (ref 20–31)
CREATININE: 1.58 mg/dL — AB (ref 0.60–0.93)
Chloride: 107 mmol/L (ref 98–110)
GLUCOSE: 121 mg/dL — AB (ref 65–99)
Potassium: 4.1 mmol/L (ref 3.5–5.3)
SODIUM: 140 mmol/L (ref 135–146)
Total Protein: 7.6 g/dL (ref 6.1–8.1)

## 2017-01-12 LAB — LIPID PANEL
CHOL/HDL RATIO: 3.6 ratio (ref <5.0)
CHOLESTEROL: 155 mg/dL (ref <200)
HDL: 43 mg/dL — ABNORMAL LOW (ref >50)
LDL Cholesterol: 34 mg/dL (ref <100)
Triglycerides: 391 mg/dL — ABNORMAL HIGH (ref <150)
VLDL: 78 mg/dL — AB (ref <30)

## 2017-01-13 LAB — T-HELPER CELL (CD4) - (RCID CLINIC ONLY)
CD4 T CELL HELPER: 29 % — AB (ref 33–55)
CD4 T Cell Abs: 1000 /uL (ref 400–2700)

## 2017-01-13 LAB — RPR

## 2017-01-14 LAB — HIV-1 RNA QUANT-NO REFLEX-BLD
HIV 1 RNA Quant: 43 copies/mL — ABNORMAL HIGH
HIV-1 RNA Quant, Log: 1.63 Log copies/mL — ABNORMAL HIGH

## 2017-01-22 MED FILL — AMLODIPINE BESYLATE 2.5 MG: 2.5 | 30 days supply | Qty: 30 | Fill #3

## 2017-01-22 MED FILL — lamiVUDine 100 MG TABS: 100 | 30 days supply | Qty: 30 | Fill #3

## 2017-01-22 MED FILL — EDURANT 25 MG TABS: 25 | 30 days supply | Qty: 30 | Fill #3

## 2017-01-22 MED FILL — TIVICAY 50 MG TABLET: 50 | 30 days supply | Qty: 30 | Fill #3

## 2017-01-26 ENCOUNTER — Ambulatory Visit (INDEPENDENT_AMBULATORY_CARE_PROVIDER_SITE_OTHER): Payer: Medicare Other | Admitting: Internal Medicine

## 2017-01-26 ENCOUNTER — Ambulatory Visit (INDEPENDENT_AMBULATORY_CARE_PROVIDER_SITE_OTHER): Payer: Medicare Other | Admitting: Licensed Clinical Social Worker

## 2017-01-26 ENCOUNTER — Encounter: Payer: Self-pay | Admitting: Internal Medicine

## 2017-01-26 VITALS — BP 165/85 | HR 78 | Temp 98.1°F | Wt 97.0 lb

## 2017-01-26 DIAGNOSIS — B20 Human immunodeficiency virus [HIV] disease: Secondary | ICD-10-CM

## 2017-01-26 DIAGNOSIS — N184 Chronic kidney disease, stage 4 (severe): Secondary | ICD-10-CM

## 2017-01-26 DIAGNOSIS — F411 Generalized anxiety disorder: Secondary | ICD-10-CM

## 2017-01-26 DIAGNOSIS — F410 Panic disorder [episodic paroxysmal anxiety] without agoraphobia: Secondary | ICD-10-CM

## 2017-01-26 MED ORDER — DOLUTEGRAVIR-RILPIVIRINE 50-25 MG PO TABS: 1.0000 | ORAL_TABLET | Freq: Every day | ORAL | 11 refills | Status: DC

## 2017-01-26 NOTE — Progress Notes (Signed)
RFV: hiv disease follow-up  Patient ID: Christine Dixon, female   DOB: Mar 18, 1946, 71 y.o.   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 She is having increased stress with more frequent panic attacks. She was prescribed lexapro and prn xanax but she has not started yet (seen at [facility] on 7/23) since she is worried about drug interaction. She is feeling that her stressors is related to taking care for her husband who has a stroke.   Outpatient Encounter Prescriptions as of 01/26/2017  Medication Sig  . amLODipine (NORVASC) 2.5 MG tablet Take 1 tablet (2.5 mg total) by mouth daily.  . dolutegravir (TIVICAY) 50 MG tablet Take 1 tablet (50 mg total) by mouth daily.  Marland Kitchen escitalopram (LEXAPRO) 10 MG tablet Take 1/2 tablet PO nightly, then can increase to full tablet PO nightly if tolerating well  . lamivudine (EPIVIR) 100 MG tablet Take 1 tablet (100 mg total) by mouth daily.  . multivitamin-iron-minerals-folic acid (CENTRUM) chewable tablet Chew 1 tablet by mouth daily. Reported on 09/09/2015  . rilpivirine (EDURANT) 25 MG TABS tablet Take 1 tablet (25 mg total) by mouth daily with breakfast.   No facility-administered encounter medications on file as of 01/26/2017.      Patient Active Problem List   Diagnosis Date Noted  . Papanicolaou smear of cervix with low grade squamous intraepithelial lesion (LGSIL) 10/08/2012  . 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 HIV DISEASE 07/22/2006  . HEPATITIS C 07/22/2006  . ANEMIA OF CHRONIC DISEASE 07/22/2006  . DYSPLASIA, CERVIX, MILD 07/22/2006  . DYSPLASIA, VAGINA 07/22/2006  . OSTEOPOROSIS 07/22/2006     Health Maintenance Due  Topic Date Due  . TETANUS/TDAP  03/13/1964  . PNA vac Low Risk Adult (2 of 2 - PCV13) 04/13/2012  . COLONOSCOPY  10/22/2014     Review of Systems Review of Systems  Constitutional: Negative for fever, chills, diaphoresis,  activity change, appetite change, fatigue and unexpected weight change.  HENT: Negative for congestion, sore throat, rhinorrhea, sneezing, trouble swallowing and sinus pressure.  Eyes: Negative for photophobia and visual disturbance.  Respiratory: Negative for cough, chest tightness, shortness of breath, wheezing and stridor.  Cardiovascular: Negative for chest pain, palpitations and leg swelling.  Gastrointestinal: Negative for nausea, vomiting, abdominal pain, diarrhea, constipation, blood in stool, abdominal distention and anal bleeding.  Genitourinary: Negative for dysuria, hematuria, flank pain and difficulty urinating.  Musculoskeletal: Negative for myalgias, back pain, joint swelling, arthralgias and gait problem.  Skin: Negative for color change, pallor, rash and wound.  Neurological: Negative for dizziness, tremors, weakness and light-headedness.  Hematological: Negative for adenopathy. Does not bruise/bleed easily.  Psychiatric/Behavioral: + panic attack    Physical Exam   BP (!) 165/85   Pulse 78   Temp 98.1 F (36.7 C) (Oral)   Wt 97 lb (44 kg)   BMI 18.94 kg/m  Physical Exam  Constitutional:  oriented to person, place, and time. appears well-developed and well-nourished. No distress.  HENT: [place]/AT, PERRLA, no scleral icterus Mouth/Throat: Oropharynx is clear and moist. No oropharyngeal exudate.  Cardiovascular: Normal rate, regular rhythm and normal heart sounds. Exam reveals no gallop and no friction rub.  No murmur heard.  Pulmonary/Chest: Effort normal and breath sounds normal. No respiratory distress.  has no wheezes.  Neck = supple, no nuchal rigidity Abdominal: Soft. Bowel sounds are normal.  exhibits no distension. There is no tenderness.  Lymphadenopathy: no  cervical adenopathy. No axillary adenopathy Neurological: alert and oriented to person, place, and time.  Skin: Skin is warm and dry. No rash noted. No erythema.  Psychiatric: a normal mood and affect.   behavior is normal.    Lab Results  Component Value Date   CD4TCELL 29 (L) 01/12/2017   Lab Results  Component Value Date   CD4TABS 1,000 01/12/2017   CD4TABS 850 04/27/2016   CD4TABS 1,340 08/28/2015   Lab Results  Component Value Date   HIV1RNAQUANT 43 (H) 01/12/2017   Lab Results  Component Value Date   HEPBSAB POS (A) 06/17/2015   Lab Results  Component Value Date   LABRPR NON REAC 01/12/2017    CBC Lab Results  Component Value Date   WBC 10.3 01/12/2017   RBC 4.24 01/12/2017   HGB 12.2 01/12/2017   HCT 36.9 01/12/2017   PLT 187 01/12/2017   MCV 87.0 01/12/2017   MCH 28.8 01/12/2017   MCHC 33.1 01/12/2017   RDW 14.2 01/12/2017   LYMPHSABS 3,708 01/12/2017   MONOABS 824 01/12/2017   EOSABS 103 01/12/2017    BMET Lab Results  Component Value Date   NA 140 01/12/2017   K 4.1 01/12/2017   CL 107 01/12/2017   CO2 21 01/12/2017   GLUCOSE 121 (H) 01/12/2017   BUN 14 01/12/2017   CREATININE 1.58 (H) 01/12/2017   CALCIUM 9.5 01/12/2017   GFRNONAA 34 (L) 04/27/2016   GFRAA 39 (L) 04/27/2016    Assessment and Plan HIV disease = will need to review genotype to see if can simplify regimen - will switch to juluca and stop lamivudine.   Anxiety with occasional panic attack= - ok to start escalopram and prn xanax. Explained how to take meds. Will have her meet with cher[facility] - thp referral  CKD 3 = stable

## 2017-01-26 NOTE — Progress Notes (Signed)
HPI: Christine Dixon is a 71 y.o. female who is here for her routine visit for her HIV.  Allergies: Allergies  Allergen Reactions  . Aspirin     REACTION: due to Crohn's  . Codeine     Vitals: Temp: 98.1 F (36.7 C) (07/31 1052) Temp Source: Oral (07/31 1052) BP: 165/85 (07/31 1052) Pulse Rate: 78 (07/31 1052)  Past Medical History: Past Medical History:  Diagnosis Date  . HIV infection ([place])   . Hypertension     Social History: Social History   Social History  . Marital status: Married    Spouse name: N/A  . Number of children: N/A  . Years of education: N/A   Social History Main Topics  . Smoking status: Former Smoker    Types: Cigarettes    Quit date: 06/29/1968  . Smokeless tobacco: Never Used  . Alcohol use No  . Drug use: No  . Sexual activity: Not Currently    Partners: Male   Other Topics Concern  . None   Social History Narrative  . None    Previous Regimen: ATP, DTG/EPZ, Triumeq  Current Regimen: DTG/RPV/3TC  Labs: HIV 1 RNA Quant (copies/mL)  Date Value  01/12/2017 43 (H)  04/27/2016 <20  08/28/2015 <20   CD4 T Cell Abs (/uL)  Date Value  01/12/2017 1,000  04/27/2016 850  08/28/2015 1,340   Hep B S Ab (no units)  Date Value  06/17/2015 POS (A)   Hepatitis B Surface Ag (no units)  Date Value  08/23/2006 No   HCV Ab (no units)  Date Value  08/23/2006 Yes    CrCl: CrCl cannot be calculated (Unknown ideal weight.).  Lipids:    Component Value Date/Time   CHOL 155 01/12/2017 1116   TRIG 391 (H) 01/12/2017 1116   HDL 43 (L) 01/12/2017 1116   CHOLHDL 3.6 01/12/2017 1116   VLDL 78 (H) 01/12/2017 1116   LDLCALC 34 01/12/2017 1116    Assessment: [name] is doing very well on her current regimen. She has always been very compliant with her ART. Notes were reviewed all the way back when she saw Dr. Orene Desanctis. She was on ATP way back when then she was transition to different regimen due to her renal function and hep C treatment.  Her VL has always been good. Dr. Baxter Flattery was asking if she would be a good candidate for Juluca. Due to her great adherence and the lack of mutations that we could find, she would be a very good candidate for it. Counseled her on how to take it and interactions. She will take it with dinner. She just got a supply of her ART so she will finish it out before starting Juluca.   Recommendations:  Dc DTG/RPV/3TC Start Juluca 1 PO qday with a meal  Onnie Boer, PharmD, BCPS, AAHIVP, CPP [facility] 01/26/2017, 11:37 AM

## 2017-01-26 NOTE — Patient Instructions (Signed)
Come back in roughly 2 months  (please do labs 1 wk prior to visit)

## 2017-01-28 NOTE — Progress Notes (Signed)
Integrated Behavioral Health Initial Visit  MRN: [id number] Name: Christine Dixon   Session Start time: 11:45 am Session End time: 12:13 pm Total time: 30 minutes  Type of Service: [place] Interpretor:No. Interpretor Name and Language: N/A   Warm Hand Off Completed.       SUBJECTIVE: Christine Dixon is a 71 y.o. female accompanied by patient. Patient was referred by Dr. Baxter Flattery for panic attacks.  Patient reports the following symptoms/concerns: Patient reported that she has been having panic attacks due to the stress of being the primary caregiver for her husband, after him having a stroke.  Patient reported that she is mentally and physically burnt out and is in need of respite care.  Additionally the husband will not utilize support from care staff to lessen the load on the patient.  Also the patient is experiencing martial conflict that began pre-stroke, which is exacerbating being a caregiver.  Patient has been married for 50 years but they separated for 14 years and came back together before the stroke.  Patient reported the following panic disorder symptoms for the past 1-2 years: unexpectedly "feeling like I'm going to die" while experiencing racing heart and increased blood pressure, heat sensations, eyes began to tear, sweating, trembling of arms, and fear of dying.  Patient was educated about the interactive nature of how her thoughts feed into hormone release, which led to presence of physical sensations, that increase thoughts of panic.  Patient was receptive to education and learning thought stopping techniques in counseling.  Duration of problem: 1-2 years; Severity of problem: moderate  OBJECTIVE: Mood: Anxious and Depressed and Affect: within range Risk of harm to self or others: No plan to harm self or others  Thought process: conherent Thought content: logical  LIFE CONTEXT: Family and Social: Patient has two sons that live in  [place] and [place] and has a daughter.  Patient has been married for 50 years and separated for 14. School/Work: Patient is retired and caring for husband. Self-Care: Patient is able to tend to her ADL's. Life Changes: Patient is caring for husband who had stroke.  ASSESSMENT: Patient is currently experiencing panic symptoms and is mentally and physically exhausted from caring for her husband. Patient may benefit from learning thought stopping techniques to manage panic and anxiety and receive respite care.  GOALS ADDRESSED: Patient will reduce symptoms of: anxiety and stress and increase knowledge and/or ability of: coping skills, healthy habits and stress reduction and also: Increase healthy adjustment to current life circumstances and Increase adequate support systems for patient/family   INTERVENTIONS: Supportive Counseling   PLAN: 1. Follow up with behavioral health clinician on : Tuesday Aug 7th at 1:30 pm 2. At next session learn thought stopping techniques and relaxation skills. 3. Referral(s): Nursing for assistance in locating respite care   Sande Rives, [facility]

## 2017-02-02 ENCOUNTER — Ambulatory Visit (INDEPENDENT_AMBULATORY_CARE_PROVIDER_SITE_OTHER): Payer: Medicare Other | Admitting: Licensed Clinical Social Worker

## 2017-02-02 ENCOUNTER — Other Ambulatory Visit: Payer: Self-pay | Admitting: Pharmacist Clinician (PhC)/ Clinical Pharmacy Specialist

## 2017-02-02 DIAGNOSIS — F410 Panic disorder [episodic paroxysmal anxiety] without agoraphobia: Secondary | ICD-10-CM

## 2017-02-02 MED ORDER — DOLUTEGRAVIR-RILPIVIRINE 50-25 MG PO TABS: 1.0000 | ORAL_TABLET | Freq: Every day | ORAL | 3 refills | Status: DC

## 2017-02-02 NOTE — Progress Notes (Signed)
Integrated Behavioral Health Follow Up Visit  MRN: [id number] Name: Christine Dixon   Session Start time: 1:45 pm Session End time: 2:45 pm Total time: 1 hour Number of Integrated Behavioral Health Clinician visits: 2/10  Type of Service: [place] Interpretor:No. Interpretor Name and Language: N/A   Warm Hand Off Completed.       SUBJECTIVE: Christine Dixon is a 71 y.o. female accompanied by patient. Patient was referred by Dr. Baxter Flattery for panic attacks.  Patient reviewed the past week and denied new issues or panic attacks.  Patient was receptive to discussing the antecedents of her panic attacks and reported that the most recent one occurred shortly after waking up.  Patient was able to identify her precursor thoughts as having to take care of her husband and her precursor feelings as dread.  Patient was able to explore the vision she set for herself and her husband before the stroke.  Patient was able to grieve for the loss of this vision as a coping strategy to manage stress from her current circumstances.  Patient was able to explore her feelings of guilt over her resentment of caring for her husband and explore ways to take time to care for herself.  As a coping strategy, the patient was educated on the use of positive affirmations to lessen guilt and prevent negative thinking and dread.  Patient was receptive to writing positive affirmations on paper during the session and placing them in helpful places at home.  Patient was receptive to continuing to write and use positive affirmations at home as needed.  [facility] educated patient on relaxation visualization to decrease anxiety and bring calm.  Patient was able to practice using visualization exercise during the session and smile after.  Patient was receptive to using visualization as needed, in caring for her husband and managing conflict at home.  Patient reported that she is excited to learn  additional coping strategies and felt the session was helpful in verbalizing repressed emotions.  Duration of problem: 1-2 years; Severity of problem: moderate  OBJECTIVE: Mood: Anxious and Affect: within range Risk of harm to self or others: No plan to harm self or others  Thought process: coherent Thought content: logical  ASSESSMENT: Patient is currently experiencing panic symptoms and is mentally and physically exhausted from caring for her husband. Patient may benefit from learning thought stopping techniques to manage panic and anxiety and receive respite care.  GOALS ADDRESSED: Patient will reduce symptoms of: anxiety and stress and increase knowledge and/or ability of: coping skills, healthy habits and stress reduction and also: Increase healthy adjustment to current life circumstances and Increase adequate support systems for patient/family  INTERVENTIONS: Mindfulness or Relaxation Training and Brief CBT  PLAN: 1. Follow up with behavioral health clinician on : Tuesday Aug 14th at 1:30 pm 2. Continue to teach thought stopping techniques, relaxation skills, use of worst case scenarios, communication skill and assertiveness training, and planned avoidance.   Sande Rives, [facility]

## 2017-02-02 NOTE — Progress Notes (Unsigned)
Had some insurance issue with her Juluca. Christine Dixon will call them to do the override.

## 2017-02-03 MED FILL — JULUCA 50-25 MG TAB: 50-25 | 90 days supply | Qty: 90 | Fill #0

## 2017-02-09 ENCOUNTER — Ambulatory Visit (INDEPENDENT_AMBULATORY_CARE_PROVIDER_SITE_OTHER): Payer: Medicare Other | Admitting: Licensed Clinical Social Worker

## 2017-02-09 DIAGNOSIS — F410 Panic disorder [episodic paroxysmal anxiety] without agoraphobia: Secondary | ICD-10-CM

## 2017-02-09 NOTE — Progress Notes (Signed)
Integrated Behavioral Health Follow Up Visit  MRN: [id number] Name: Christine Dixon   Session Start time: 1:38 pm Session End time: 2:38 pm Total time: 1 hour Number of Integrated Behavioral Health Clinician visits: 3/10  Type of Service: [place] Interpretor:No. Interpretor Name and Language: N/A   Warm Hand Off Completed.       SUBJECTIVE: Christine Dixon is a 71 y.o. female accompanied by patient. Patient was referred by Dr. Baxter Flattery for panic attacks.  Patient and [facility] reviewed the past week and patient denied episodes of panic attacks and reported the week was uneventful.  Patient reported that she has been trying to stay distracted from her thoughts and stay busy.  Patient confirmed that she has been using positive affirmations to stay positive and prevent feeling overwhelmed.  Fries attempted to use technique of paradoxical worst case scenairo but patient was not able to stay focused and instead was directive in what she had tried before in the past and the conditions her husband is creating.  Patient explored past issues with control and manipulation and current circumstances where husband is exhibiting jealous and controlling behaviors.  Patient verbalized husband's prior unsafe and aggressive behaviors and reported that he has been involuntarily committed twice.  Patient reported feeling uneasy because she does not know what will trigger her husband's behaviors but reported feeling safe in her daily life.  Patient verbalized her desire for her husband to leave the house and processed her feelings of guilt which are keeping her from asking him to leave.  Patient was clear in her ambivalence and feeling responsible for taking care of her husband but desiring him to leave at the same time.  Patient was able to explore her internal conflict and self-punishment (pain and suffering) from lack of action.  Duration of problem: 1-2 years; Severity of problem:  moderate  OBJECTIVE: Mood: Anxious and Affect: Constricted Risk of harm to self or others: No plan to harm self or others  Thought process: coherent Thought content: logical  ASSESSMENT: Patient is currently experiencing panic symptoms and is mentally and physically exhausted from caring for her husband. Patient may benefit from learning thought stopping techniques to manage panic and anxiety and receive respite care.  GOALS ADDRESSED: Patient will reduce symptoms of: anxiety and stress and increase knowledge and/or ability of: coping skills, healthy habits and stress reduction and also: Increase healthy adjustment to current life circumstances and Increase adequate support systems for patient/family  INTERVENTIONS: Motivational Interviewing  PLAN: 1. Follow up with behavioral health clinician on : Tues Aug 21 at 1:30 pm. 2. Continue to teach thought stopping techniques, relaxation skills, use of worst case scenarios, communication skill and assertiveness training, and planned avoidance.  Sande Rives, [facility]

## 2017-02-16 ENCOUNTER — Ambulatory Visit (INDEPENDENT_AMBULATORY_CARE_PROVIDER_SITE_OTHER): Payer: Medicare Other | Admitting: Licensed Clinical Social Worker

## 2017-02-16 DIAGNOSIS — F410 Panic disorder [episodic paroxysmal anxiety] without agoraphobia: Secondary | ICD-10-CM

## 2017-02-16 NOTE — Progress Notes (Signed)
Integrated Behavioral Health Follow Up Visit  MRN: [id number] Name: Christine Dixon   Session Start time: 1:34 pm Session End time: 2:33 pm Total time: 1 hour Number of Integrated Behavioral Health Clinician visits: 4/10  Type of Service: [place] Interpretor:No. Interpretor Name and Language: N/A  SUBJECTIVE: Christine Dixon is a 71 y.o. female accompanied by patient. Patient was referred by Dr. Baxter Flattery for panic attacks and anxiety.  Patient reported that she went to her PCP and was informed that she is doing well and managing her anxiety.  Patient denied experience of panic attacks and stated "I think I'm getting better".  Patient denied having to take Xanax PRN.  Patient processed her anger and verbalized her internalized anger towards herself for getting back with her husband.  Patient processed her ability to remain friends with her husband and participate in enjoyable activities together.  Patient processed her attempts to spend more time with him and take him out to functions.  Patient was also receptive to reviewing the power of positive thinking and surrounding herself with positive energy.  Patient denied that her husband has been willing to engage with her and reported a decrease in communication.  Duration of problem: 1-2 years; Severity of problem: moderate  OBJECTIVE: Mood: Depressed and Affect: within range Risk of harm to self or others: No plan to harm self or others  Thought process: tangential; circumstantial Thought content:logical  ASSESSMENT: Patient is now beginning to address anger towards self and presented with little insight of her passive aggressive ways towards her husband.  Patient verbalized behaviors of ignoring and not speaking with her husband, staying upstairs on the top level of the house avoiding him, and spending the day outside of the house.  Patient is unable to communicate effectively with her husband and has  experienced no change in her life and relationship satisfaction.  GOALS ADDRESSED: Patient will reduce symptoms of: anxiety and stress and increase knowledge and/or ability of: coping skills, healthy habits and stress reduction and also: Increase healthy adjustment to current life circumstances and Increase adequate support systems for patient/family  INTERVENTIONS: Solution-Focused Strategies and Psychoeducation and/or Health Education Assessed anxiety symptoms.  Processed anger feelings.  Educated on passive aggression.  Confronted thinking on role and responsibilities with husband.  Encouraged communication skills, couples counseling, and spending time.  Processed power of positive thinking.  Acknowledged patient for making first step in going to counseling to make changes in her life.  PLAN: 1. Follow up with behavioral health clinician on : Tues Aug 28 at 1:30 pm 2. Behavioral recommendations: Begin to use journal to write letter to self acknowledging misjudgment/mistakes of getting back together with husband and forgiving self for this. 3. At next session will continue to assess symptoms, address upcoming termination of counseling and need for referrals, and continue to provide psychoeducation and therapeutic support.  Sande Rives, [facility]

## 2017-02-23 ENCOUNTER — Ambulatory Visit (INDEPENDENT_AMBULATORY_CARE_PROVIDER_SITE_OTHER): Payer: Medicare Other | Admitting: Licensed Clinical Social Worker

## 2017-02-23 DIAGNOSIS — F410 Panic disorder [episodic paroxysmal anxiety] without agoraphobia: Secondary | ICD-10-CM

## 2017-02-24 NOTE — BH Specialist Note (Signed)
Integrated Behavioral Health Follow Up Visit  MRN: [id number] Name: [name]   Session Start time: 1:50 pm Session End time: 2:50 pm Total time: 1 hour Number of Integrated Behavioral Health Clinician visits: 5/10  Type of Service: [place] Interpretor:No. Interpretor Name and Language: N/A   SUBJECTIVE: Christine Dixon is a 71 y.o. female accompanied by patient. Patient was referred by Dr. Baxter Flattery for panic attacks and anxiety.  Patient reported "much calmer" communication with her husband and reported that she has been attempting to improve her communication skills.  Patient reported increased insight into the need to engage in dialogue with her husband and to be less aggressive with him.  Patient was active and engaged in the [employer] to [employer] the Spirit activity and was able to identify that she needed more peace, empathy, and kindness.  Patient was able to identify specific ways to increase the traits and was asked to complete the activity at home.  [facility] also addressed termination of the counseling relationship and addressed the patient's thoughts on continuing to receive behavioral health services.  [facility] showed patient a Control and instrumentation engineer and how to search for [employer].  Patient was receptive to continuing services with another provider.  Duration of problem: 1-2 years; Severity of problem: moderate  OBJECTIVE: Behavior: Restless Mood: Anxious and Affect: within range Risk of harm to self or others: No plan to harm self or others  Thought process: tangential Thought content: logical  ASSESSMENT: Patient is currently experiencing family conflict issues and working hard to utilize coping strategies to decrease anxiety and panic.  Patient denied experience of panic attacks lately and related to using thought stopping techniques.  Patient is also increasing insight into the need to engage with her husband instead of  avoiding him.  Patient's life and relationship satisfaction is varying based on the situation.  Patient may benefit from continued behavioral health services and learning additional relaxation skills.  GOALS ADDRESSED: Patient will reduce symptoms of: anxiety and stress and increase knowledge and/or ability of: coping skills, healthy habits and stress reduction and also: Increase healthy adjustment to current life circumstances and Increase adequate support systems for patient/family  INTERVENTIONS: Solution-Focused Strategies and Psychoeducation and/or Health Education [employer] provided a hand-out on ways to increase inner peace.  [facility] and patient discussed difficulties with obtaining peace and how control and peace do not go hand in hand.  Addressed role of acceptance in peace.  PLAN: 1. Follow up with behavioral health clinician on : Tues Sept 4th at 1:30 pm- Last session 2. Behavioral recommendations: Continue identifying specific ways to increase desired traits from earlier activity.  Continue to utilize learned coping strategies 3. At next session [facility] will address termination and assist the patient in selecting another provider, review her therapeutic progress, take the GAD-7 assessment, and learn relaxation skills.   Sande Rives, [facility]

## 2017-03-02 ENCOUNTER — Ambulatory Visit (INDEPENDENT_AMBULATORY_CARE_PROVIDER_SITE_OTHER): Payer: Medicare Other | Admitting: Licensed Clinical Social Worker

## 2017-03-02 DIAGNOSIS — F410 Panic disorder [episodic paroxysmal anxiety] without agoraphobia: Secondary | ICD-10-CM

## 2017-03-05 NOTE — BH Specialist Note (Signed)
Integrated Behavioral Health Follow Up Visit  MRN: [id number] Name: Christine Dixon   Session Start time: 1:35 pm Session End time: 2:25 pm Total time: 50 minutes Number of Integrated Behavioral Health Clinician visits: 6/10  Type of Service: [place] Interpretor:No. Interpretor Name and Language: N/A   SUBJECTIVE: Christine Dixon is a 71 y.o. female accompanied by patient. Patient was referred by Dr. Baxter Flattery for panic attacks and anxiety.  Patient reported current anxiety due to last session and having to locate and introduce self to another counselor.  Patient was only partially receptive to [facility] explaining the trial and error of locating a counselor and the need to provide feedback on the relationship.  Patient informed that she was not able to use her Internet to pull up providers at home so the [facility] assisted her during the session to pick three providers from [employer] directory, accepting [employer].  Patient was able to review her therapeutic progress and she denied experience of panic disorder symptoms.  [facility] completed the GAD-7 assessment and patient scored a 2 evidencing low/mild anxiety.  Patient also reported that she is feeling more comfortable communicating with her husband in not addressing his faults and is using planned ignoring, positive thinking, and establishing healthy boundaries.  Duration of problem: 1-2 years; Severity of problem: moderate  OBJECTIVE: Mood: Anxious and Affect: within range Risk of harm to self or others: No plan to harm self or others  Thought process: coherent Thought content: logical  ASSESSMENT: Since beginning treatment, patient has reduced anxiety, increased communication with her husband and been receptive to change.  She has increased her personal insight that she can only change herself in relating to others and reported an increase in life satisfaction.  To continue changes made and to  continue working toward goals, patient should continue receiving behavioral health services.    GOALS ADDRESSED: Patient will reduce symptoms of: anxiety and stress and increase knowledge and/or ability of: coping skills, healthy habits and stress reduction and also: Increase healthy adjustment to current life circumstances and Increase adequate support systems for patient/family  INTERVENTIONS: Solution-Focused Strategies and Psychoeducation and/or Health Education Standardized Assessments completed: GAD-7   PLAN: 1. Behavioral recommendations: Follow through with referrals and continue behavioral health services 2. Referral(s): [facility] (LME/Outside Clinic) 3. "From scale of 1-10, how likely are you to follow plan?": 6  Sande Rives, [facility]

## 2017-03-30 ENCOUNTER — Ambulatory Visit (INDEPENDENT_AMBULATORY_CARE_PROVIDER_SITE_OTHER): Payer: Medicare Other | Admitting: Internal Medicine

## 2017-03-30 DIAGNOSIS — B20 Human immunodeficiency virus [HIV] disease: Secondary | ICD-10-CM

## 2017-03-30 DIAGNOSIS — Z0000 Encounter for general adult medical examination without abnormal findings: Secondary | ICD-10-CM | POA: Diagnosis not present

## 2017-03-30 DIAGNOSIS — Z23 Encounter for immunization: Secondary | ICD-10-CM

## 2017-03-30 DIAGNOSIS — F410 Panic disorder [episodic paroxysmal anxiety] without agoraphobia: Secondary | ICD-10-CM

## 2017-03-30 NOTE — Progress Notes (Signed)
RFV: hiv disease follow up  Patient ID: Christine Dixon, female   DOB: 10-Sep-1945, 71 y.o.   MRN: [id number]  HPI Christine Dixon is 71yo F with well controlled hiv disease. CD 4 count of 1000/VL 43, recently changed to juluca. She has been on it for the past 2-3 wk, doing well. She states that she has much improved outlook with her panic disorder. She has good interaction with counselor, Christine Dixon. She has not had any panic attacks since last visit  Outpatient Encounter Prescriptions as of 03/30/2017  Medication Sig  . ALPRAZolam (XANAX) 0.5 MG tablet Take by mouth.  Marland Kitchen amLODipine (NORVASC) 2.5 MG tablet Take 1 tablet (2.5 mg total) by mouth daily.  . Dolutegravir-Rilpivirine (JULUCA) 50-25 MG TABS Take 1 tablet by mouth daily. With meal  . escitalopram (LEXAPRO) 10 MG tablet Take 1/2 tablet PO nightly, then can increase to full tablet PO nightly if tolerating well  . multivitamin-iron-minerals-folic acid (CENTRUM) chewable tablet Chew 1 tablet by mouth daily. Reported on 09/09/2015   No facility-administered encounter medications on file as of 03/30/2017.      Patient Active Problem List   Diagnosis Date Noted  . Papanicolaou smear of cervix with low grade squamous intraepithelial lesion (LGSIL) 10/08/2012  . 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 HIV DISEASE 07/22/2006  . HEPATITIS C 07/22/2006  . ANEMIA OF CHRONIC DISEASE 07/22/2006  . DYSPLASIA, CERVIX, MILD 07/22/2006  . DYSPLASIA, VAGINA 07/22/2006  . OSTEOPOROSIS 07/22/2006     Health Maintenance Due  Topic Date Due  . TETANUS/TDAP  03/13/1964  . PNA vac Low Risk Adult (2 of 2 - PCV13) 04/13/2012  . COLONOSCOPY  10/22/2014  . INFLUENZA VACCINE  01/27/2017     Review of Systems  Constitutional: Negative for fever, chills, diaphoresis, activity change, appetite change, fatigue and unexpected weight change.  HENT: Negative for congestion, sore throat, rhinorrhea, sneezing, trouble swallowing and sinus pressure.  Eyes:  Negative for photophobia and visual disturbance.  Respiratory: Negative for cough, chest tightness, shortness of breath, wheezing and stridor.  Cardiovascular: Negative for chest pain, palpitations and leg swelling.  Gastrointestinal: Negative for nausea, vomiting, abdominal pain, diarrhea, constipation, blood in stool, abdominal distention and anal bleeding.  Genitourinary: Negative for dysuria, hematuria, flank pain and difficulty urinating.  Musculoskeletal: Negative for myalgias, back pain, joint swelling, arthralgias and gait problem.  Skin: Negative for color change, pallor, rash and wound.  Neurological: Negative for dizziness, tremors, weakness and light-headedness.  Hematological: Negative for adenopathy. Does not bruise/bleed easily.  Psychiatric/Behavioral: Negative for behavioral problems, confusion, sleep disturbance, dysphoric mood, decreased concentration and agitation.    Physical Exam   There were no vitals taken for this visit. Physical Exam  Constitutional:  oriented to person, place, and time. appears well-developed and well-nourished. No distress.  HENT: [place]/AT, PERRLA, no scleral icterus Mouth/Throat: Oropharynx is clear and moist. No oropharyngeal exudate.  Lymphadenopathy: no cervical adenopathy. No axillary adenopathy Neurological: alert and oriented to person, place, and time.  Skin: Skin is warm and dry. No rash noted. No erythema.  Psychiatric: a normal mood and affect.  behavior is normal.    Lab Results  Component Value Date   CD4TCELL 29 (L) 01/12/2017   Lab Results  Component Value Date   CD4TABS 1,000 01/12/2017   CD4TABS 850 04/27/2016   CD4TABS 1,340 08/28/2015   Lab Results  Component Value Date   HIV1RNAQUANT 43 (H) 01/12/2017   Lab Results  Component Value Date   HEPBSAB POS (A)  06/17/2015   Lab Results  Component Value Date   LABRPR NON REAC 01/12/2017    CBC Lab Results  Component Value Date   WBC 10.3 01/12/2017   RBC 4.24  01/12/2017   HGB 12.2 01/12/2017   HCT 36.9 01/12/2017   PLT 187 01/12/2017   MCV 87.0 01/12/2017   MCH 28.8 01/12/2017   MCHC 33.1 01/12/2017   RDW 14.2 01/12/2017   LYMPHSABS 3,708 01/12/2017   MONOABS 824 01/12/2017   EOSABS 103 01/12/2017    BMET Lab Results  Component Value Date   NA 140 01/12/2017   K 4.1 01/12/2017   CL 107 01/12/2017   CO2 21 01/12/2017   GLUCOSE 121 (H) 01/12/2017   BUN 14 01/12/2017   CREATININE 1.58 (H) 01/12/2017   CALCIUM 9.5 01/12/2017   GFRNONAA 34 (L) 04/27/2016   GFRAA 39 (L) 04/27/2016      Assessment and Plan  hiv disease = tolerating switch to juluca thus far. We will check VL in 4 wk (since she has only been on new medication for the past 3-4 wk)  Anxiety = continue with counseling and use prn benzo for rescue  Health maintenance =flu shot today

## 2017-04-02 ENCOUNTER — Telehealth: Payer: Self-pay | Admitting: Licensed Clinical Social Worker

## 2017-04-02 NOTE — Telephone Encounter (Signed)
[facility] called patient to see interest in meeting with new counselor to resume behavioral health services.  Patient reported that she was interested, an appointment with scheduled with Marcie Bal for 10/18 at 2 pm.  Sande Rives, [facility]

## 2017-04-15 ENCOUNTER — Ambulatory Visit: Payer: Medicare Other

## 2017-04-22 ENCOUNTER — Ambulatory Visit: Payer: Medicare Other

## 2017-04-29 MED FILL — AMLODIPINE BESYLATE 2.5 MG: 2.5 | 30 days supply | Qty: 30 | Fill #4

## 2017-04-29 NOTE — Progress Notes (Signed)
error 

## 2017-04-29 NOTE — BH Specialist Note (Signed)
ERROR

## 2017-05-06 ENCOUNTER — Ambulatory Visit: Payer: Medicare Other

## 2017-05-27 MED FILL — JULUCA 50-25 MG TAB: 50-25 | 90 days supply | Qty: 90 | Fill #1

## 2017-06-04 ENCOUNTER — Other Ambulatory Visit: Payer: Self-pay | Admitting: Pharmacist

## 2017-06-04 ENCOUNTER — Other Ambulatory Visit: Payer: Medicare Other

## 2017-06-04 DIAGNOSIS — B20 Human immunodeficiency virus [HIV] disease: Secondary | ICD-10-CM

## 2017-06-07 LAB — HIV-1 RNA QUANT-NO REFLEX-BLD
HIV 1 RNA QUANT: 77 {copies}/mL — AB
HIV-1 RNA Quant, Log: 1.89 Log copies/mL — ABNORMAL HIGH

## 2017-06-10 ENCOUNTER — Ambulatory Visit: Payer: Medicare Other

## 2017-06-15 ENCOUNTER — Ambulatory Visit (INDEPENDENT_AMBULATORY_CARE_PROVIDER_SITE_OTHER): Payer: Medicare Other | Admitting: Internal Medicine

## 2017-06-15 ENCOUNTER — Ambulatory Visit: Payer: Medicare Other | Admitting: Internal Medicine

## 2017-06-15 VITALS — BP 169/81 | HR 90 | Temp 97.6°F | Wt 103.0 lb

## 2017-06-15 DIAGNOSIS — I10 Essential (primary) hypertension: Secondary | ICD-10-CM | POA: Diagnosis not present

## 2017-06-15 DIAGNOSIS — F411 Generalized anxiety disorder: Secondary | ICD-10-CM

## 2017-06-15 DIAGNOSIS — N184 Chronic kidney disease, stage 4 (severe): Secondary | ICD-10-CM

## 2017-06-15 DIAGNOSIS — B20 Human immunodeficiency virus [HIV] disease: Secondary | ICD-10-CM | POA: Diagnosis present

## 2017-06-15 DIAGNOSIS — F410 Panic disorder [episodic paroxysmal anxiety] without agoraphobia: Secondary | ICD-10-CM

## 2017-06-15 NOTE — Progress Notes (Signed)
RFV: follow up for hiv disease  Patient ID: Christine Dixon, female   DOB: [birth year], 71 y.o.   MRN: [id number]  HPI  72yo F with hiv disease, CD 4 count of 1,000/ VL 77 in dec 2018. Currently on DTG-RPV. She reports missing a few of her counseling sessions due to scheduling conflicts, and weather of late. She is otherwise doing ok. She states that anxiety is improved with going to counseling.  After receiving flu shot, she felt under the weather.  She is now feeling better. Outpatient Encounter Medications as of 06/15/2017  Medication Sig  . ALPRAZolam (XANAX) 0.5 MG tablet Take by mouth.  Marland Kitchen amLODipine (NORVASC) 2.5 MG tablet Take 1 tablet (2.5 mg total) by mouth daily.  . Dolutegravir-Rilpivirine (JULUCA) 50-25 MG TABS Take 1 tablet by mouth daily. With meal  . escitalopram (LEXAPRO) 10 MG tablet Take 1/2 tablet PO nightly, then can increase to full tablet PO nightly if tolerating well  . multivitamin-iron-minerals-folic acid (CENTRUM) chewable tablet Chew 1 tablet by mouth daily. Reported on 09/09/2015   No facility-administered encounter medications on file as of 06/15/2017.      Patient Active Problem List   Diagnosis Date Noted  . Papanicolaou smear of cervix with low grade squamous intraepithelial lesion (LGSIL) 10/08/2012  . 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 HIV DISEASE 07/22/2006  . HEPATITIS C 07/22/2006  . ANEMIA OF CHRONIC DISEASE 07/22/2006  . DYSPLASIA, CERVIX, MILD 07/22/2006  . DYSPLASIA, VAGINA 07/22/2006  . OSTEOPOROSIS 07/22/2006     Health Maintenance Due  Topic Date Due  . TETANUS/TDAP  03/13/1964  . PNA vac Low Risk Adult (2 of 2 - PCV13) 04/13/2012  . COLONOSCOPY  10/22/2014     Review of Systems  Constitutional: Negative for fever, chills, diaphoresis, activity change, appetite change, fatigue and unexpected weight change.  HENT: Negative for congestion, sore throat, rhinorrhea, sneezing, trouble swallowing and sinus pressure.    Eyes: Negative for photophobia and visual disturbance.  Respiratory: Negative for cough, chest tightness, shortness of breath, wheezing and stridor.  Cardiovascular: Negative for chest pain, palpitations and leg swelling.  Gastrointestinal: Negative for nausea, vomiting, abdominal pain, diarrhea, constipation, blood in stool, abdominal distention and anal bleeding.  Genitourinary: Negative for dysuria, hematuria, flank pain and difficulty urinating.  Musculoskeletal: Negative for myalgias, back pain, joint swelling, arthralgias and gait problem.  Skin: Negative for color change, pallor, rash and wound.  Neurological: Negative for dizziness, tremors, weakness and light-headedness.  Hematological: Negative for adenopathy. Does not bruise/bleed easily.  Psychiatric/Behavioral: Negative for behavioral problems, confusion, sleep disturbance, dysphoric mood, decreased concentration and agitation.  ' Physical Exam  BP (!) 169/81   Pulse 90   Temp 97.6 F (36.4 C) (Oral)   Wt 103 lb (46.7 kg)   BMI 20.12 kg/m  Physical Exam  Constitutional:  oriented to person, place, and time. appears well-developed and well-nourished. No distress.  HENT: [place]/AT, PERRLA, no scleral icterus Mouth/Throat: Oropharynx is clear and moist. No oropharyngeal exudate.  Cardiovascular: Normal rate, regular rhythm and normal heart sounds. Exam reveals no gallop and no friction rub.  No murmur heard.  Pulmonary/Chest: Effort normal and breath sounds normal. No respiratory distress.  has no wheezes.  Neck = supple, no nuchal rigidity Abdominal: Soft. Bowel sounds are normal.  exhibits no distension. There is no tenderness.  Lymphadenopathy: no cervical adenopathy. No axillary adenopathy Neurological: alert and oriented to person, place, and time.  Skin: Skin is warm and dry. No rash noted.  No erythema.  Psychiatric: a normal mood and affect.  behavior is normal.    Lab Results  Component Value Date   CD4TCELL 29 (L)  01/12/2017   Lab Results  Component Value Date   CD4TABS 1,000 01/12/2017   CD4TABS 850 04/27/2016   CD4TABS 1,340 08/28/2015   Lab Results  Component Value Date   HIV1RNAQUANT 77 (H) 06/04/2017   Lab Results  Component Value Date   HEPBSAB POS (A) 06/17/2015   Lab Results  Component Value Date   LABRPR NON REAC 01/12/2017    CBC Lab Results  Component Value Date   WBC 10.3 01/12/2017   RBC 4.24 01/12/2017   HGB 12.2 01/12/2017   HCT 36.9 01/12/2017   PLT 187 01/12/2017   MCV 87.0 01/12/2017   MCH 28.8 01/12/2017   MCHC 33.1 01/12/2017   RDW 14.2 01/12/2017   LYMPHSABS 3,708 01/12/2017   MONOABS 824 01/12/2017   EOSABS 103 01/12/2017    BMET Lab Results  Component Value Date   NA 140 01/12/2017   K 4.1 01/12/2017   CL 107 01/12/2017   CO2 21 01/12/2017   GLUCOSE 121 (H) 01/12/2017   BUN 14 01/12/2017   CREATININE 1.58 (H) 01/12/2017   CALCIUM 9.5 01/12/2017   GFRNONAA 34 (L) 04/27/2016   GFRAA 39 (L) 04/27/2016      Assessment and Plan hiv disease = has low level viremia, unsure if it is a blip vs. Gaining mutations from the switch to DTG-RPV. Recommended to continue to take with food. 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 In addn with check ua for proteinuria  Hypertension = she had occasional dizziness thus we decreased her amlodipine to 2.16m. If BP still elevated at next visit, consider changing to 599mdaily. She does suffer from white coat syndrome occasionally  Anxiety = continue with going to counseling. Will give appt to her in clinic so that she does not have to go through [facility]

## 2017-06-15 NOTE — Patient Instructions (Signed)
   You will need to get : TDAP and Pneumo vaccine (PS13) in the beginning of the year   Come in for repeat viral load in January ( this coincide with counseling appt)  We will see you back in 2 months

## 2017-06-23 MED FILL — AMLODIPINE 2.5 MG TABLET: 2.5 | 30 days supply | Qty: 30 | Fill #5

## 2017-07-27 ENCOUNTER — Ambulatory Visit: Payer: Medicare Other

## 2017-07-27 ENCOUNTER — Other Ambulatory Visit: Payer: Medicare Other

## 2017-07-27 ENCOUNTER — Telehealth: Payer: Self-pay

## 2017-07-27 DIAGNOSIS — B20 Human immunodeficiency virus [HIV] disease: Secondary | ICD-10-CM

## 2017-07-27 DIAGNOSIS — N184 Chronic kidney disease, stage 4 (severe): Secondary | ICD-10-CM

## 2017-07-27 LAB — BASIC METABOLIC PANEL
BUN/Creatinine Ratio: 9 (calc) (ref 6–22)
BUN: 12 mg/dL (ref 7–25)
CALCIUM: 9.5 mg/dL (ref 8.6–10.4)
CHLORIDE: 105 mmol/L (ref 98–110)
CO2: 23 mmol/L (ref 20–32)
Creat: 1.41 mg/dL — ABNORMAL HIGH (ref 0.60–0.93)
GLUCOSE: 164 mg/dL — AB (ref 65–99)
Potassium: 3.7 mmol/L (ref 3.5–5.3)
Sodium: 140 mmol/L (ref 135–146)

## 2017-07-27 NOTE — Telephone Encounter (Signed)
Patient came in the office today for lab work and also to get Pneumo/ Tdap vaccine. However, when I called the patient back to give her the vaccines she stated that she did not want to get them done today because she did not want  her "arms to hurt because she has a lot to get done today". She states she will get the vaccines done when she comes back for her office visit in February.  [place]

## 2017-07-29 LAB — HIV-1 RNA QUANT-NO REFLEX-BLD
HIV 1 RNA QUANT: 44 {copies}/mL — AB
HIV-1 RNA QUANT, LOG: 1.64 {Log_copies}/mL — AB

## 2017-08-10 ENCOUNTER — Ambulatory Visit (INDEPENDENT_AMBULATORY_CARE_PROVIDER_SITE_OTHER): Payer: Medicare Other | Admitting: Internal Medicine

## 2017-08-10 ENCOUNTER — Encounter: Payer: Self-pay | Admitting: Internal Medicine

## 2017-08-10 VITALS — BP 174/73 | HR 108 | Temp 98.6°F | Wt 103.0 lb

## 2017-08-10 DIAGNOSIS — B182 Chronic viral hepatitis C: Secondary | ICD-10-CM | POA: Diagnosis present

## 2017-08-10 DIAGNOSIS — I10 Essential (primary) hypertension: Secondary | ICD-10-CM | POA: Diagnosis not present

## 2017-08-10 DIAGNOSIS — B20 Human immunodeficiency virus [HIV] disease: Secondary | ICD-10-CM

## 2017-08-10 NOTE — Progress Notes (Signed)
RFV: follow up for hiv disease  Patient ID: Christine Dixon, female   DOB: Apr 23, 1946, 72 y.o.   MRN: [id number]  HPI  Christine Dixon is a 72yo F with hiv disease, on juluca, CD 4 count of 1000/VL 44. Denies any missing doses. She reports to be in good health. No recent illnesses.   Soc hx: non smoker, nor alcohol use Outpatient Encounter Medications as of 08/10/2017  Medication Sig  . ALPRAZolam (XANAX) 0.5 MG tablet Take by mouth.  Marland Kitchen amLODipine (NORVASC) 2.5 MG tablet Take 1 tablet (2.5 mg total) by mouth daily.  . Dolutegravir-Rilpivirine (JULUCA) 50-25 MG TABS Take 1 tablet by mouth daily. With meal  . escitalopram (LEXAPRO) 10 MG tablet Take 1/2 tablet PO nightly, then can increase to full tablet PO nightly if tolerating well  . multivitamin-iron-minerals-folic acid (CENTRUM) chewable tablet Chew 1 tablet by mouth daily. Reported on 09/09/2015   No facility-administered encounter medications on file as of 08/10/2017.      Patient Active Problem List   Diagnosis Date Noted  . Papanicolaou smear of cervix with low grade squamous intraepithelial lesion (LGSIL) 10/08/2012  . 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 HIV DISEASE 07/22/2006  . HEPATITIS C 07/22/2006  . ANEMIA OF CHRONIC DISEASE 07/22/2006  . DYSPLASIA, CERVIX, MILD 07/22/2006  . DYSPLASIA, VAGINA 07/22/2006  . OSTEOPOROSIS 07/22/2006     Health Maintenance Due  Topic Date Due  . TETANUS/TDAP  03/13/1964  . PNA vac Low Risk Adult (2 of 2 - PCV13) 04/13/2012  . COLONOSCOPY  10/22/2014     Review of Systems Review of Systems  Constitutional: Negative for fever, chills, diaphoresis, activity change, appetite change, fatigue and unexpected weight change.  HENT: Negative for congestion, sore throat, rhinorrhea, sneezing, trouble swallowing and sinus pressure.  Eyes: Negative for photophobia and visual disturbance.  Respiratory: Negative for cough, chest tightness, shortness of breath, wheezing and stridor.    Cardiovascular: Negative for chest pain, palpitations and leg swelling.  Gastrointestinal: Negative for nausea, vomiting, abdominal pain, diarrhea, constipation, blood in stool, abdominal distention and anal bleeding.  Genitourinary: Negative for dysuria, hematuria, flank pain and difficulty urinating.  Musculoskeletal: Negative for myalgias, back pain, joint swelling, arthralgias and gait problem.  Skin: Negative for color change, pallor, rash and wound.  Neurological: Negative for dizziness, tremors, weakness and light-headedness.  Hematological: Negative for adenopathy. Does not bruise/bleed easily.  Psychiatric/Behavioral: Negative for behavioral problems, confusion, sleep disturbance, dysphoric mood, decreased concentration and agitation.    Physical Exam   BP (!) 174/73   Pulse (!) 108   Temp 98.6 F (37 C) (Oral)   Wt 103 lb (46.7 kg)   BMI 20.12 kg/m   Physical Exam  Constitutional:  oriented to person, place, and time. appears well-developed and well-nourished. No distress.  HENT: [place]/AT, PERRLA, no scleral icterus Mouth/Throat: Oropharynx is clear and moist. No oropharyngeal exudate.  Cardiovascular: Normal rate, regular rhythm and normal heart sounds. Exam reveals no gallop and no friction rub.  No murmur heard.  Pulmonary/Chest: Effort normal and breath sounds normal. No respiratory distress.  has no wheezes.  Neck = supple, no nuchal rigidity Abdominal: Soft. Bowel sounds are normal.  exhibits no distension. There is no tenderness.  Lymphadenopathy: no cervical adenopathy. No axillary adenopathy Neurological: alert and oriented to person, place, and time.  Skin: Skin is warm and dry. No rash noted. No erythema.  Psychiatric: a normal mood and affect.  behavior is normal.   Lab Results  Component Value Date  CD4TCELL 29 (L) 01/12/2017   Lab Results  Component Value Date   CD4TABS 1,000 01/12/2017   CD4TABS 850 04/27/2016   CD4TABS 1,340 08/28/2015   Lab  Results  Component Value Date   HIV1RNAQUANT 44 (H) 07/27/2017   Lab Results  Component Value Date   HEPBSAB POS (A) 06/17/2015   Lab Results  Component Value Date   LABRPR NON REAC 01/12/2017    CBC Lab Results  Component Value Date   WBC 10.3 01/12/2017   RBC 4.24 01/12/2017   HGB 12.2 01/12/2017   HCT 36.9 01/12/2017   PLT 187 01/12/2017   MCV 87.0 01/12/2017   MCH 28.8 01/12/2017   MCHC 33.1 01/12/2017   RDW 14.2 01/12/2017   LYMPHSABS 3,708 01/12/2017   MONOABS 824 01/12/2017   EOSABS 103 01/12/2017    BMET Lab Results  Component Value Date   NA 140 07/27/2017   K 3.7 07/27/2017   CL 105 07/27/2017   CO2 23 07/27/2017   GLUCOSE 164 (H) 07/27/2017   BUN 12 07/27/2017   CREATININE 1.41 (H) 07/27/2017   CALCIUM 9.5 07/27/2017   GFRNONAA 34 (L) 04/27/2016   GFRAA 39 (L) 04/27/2016      Assessment and Plan  hiv disease = low level viremia at 44 copies, not worse than before. Will continue to monitor. Reminded to take with food which could acct for blip  ckd 4- continue to monitor at next appt  hcc surveillance = need to do US-limited since hx of treated hep c .  htn = patient is not well controlled but tends to be to be elevated during doc visit. Will repeat bp check at next visit to see if still elevated  Health maintenance =will give pneumococcal vax

## 2017-08-11 MED FILL — AMLODIPINE 2.5 MG TABLET: 2.5 | 30 days supply | Qty: 30 | Fill #6

## 2017-08-12 ENCOUNTER — Ambulatory Visit: Payer: Medicare Other

## 2017-08-17 ENCOUNTER — Ambulatory Visit (HOSPITAL_COMMUNITY)
Admission: RE | Admit: 2017-08-17 | Discharge: 2017-08-17 | Disposition: A | Discharge status: Home / Self Care | Payer: Medicare Other | Source: Ambulatory Visit | Attending: Internal Medicine | Admitting: Internal Medicine

## 2017-08-17 DIAGNOSIS — K824 Cholesterolosis of gallbladder: Secondary | ICD-10-CM | POA: Insufficient documentation

## 2017-08-17 DIAGNOSIS — B182 Chronic viral hepatitis C: Secondary | ICD-10-CM | POA: Insufficient documentation

## 2017-08-17 DIAGNOSIS — K8020 Calculus of gallbladder without cholecystitis without obstruction: Secondary | ICD-10-CM | POA: Diagnosis not present

## 2017-09-02 ENCOUNTER — Ambulatory Visit: Payer: Medicare Other

## 2017-09-13 ENCOUNTER — Other Ambulatory Visit: Payer: Self-pay

## 2017-09-13 ENCOUNTER — Emergency Department (HOSPITAL_COMMUNITY)
Admission: EM | Admit: 2017-09-13 | Discharge: 2017-09-14 | Disposition: A | Discharge status: Home / Self Care | Payer: Medicare Other | Attending: Emergency Medicine | Admitting: Emergency Medicine

## 2017-09-13 ENCOUNTER — Encounter (HOSPITAL_COMMUNITY): Payer: Self-pay | Admitting: Emergency Medicine

## 2017-09-13 ENCOUNTER — Other Ambulatory Visit: Payer: Self-pay | Admitting: Internal Medicine

## 2017-09-13 DIAGNOSIS — Z79899 Other long term (current) drug therapy: Secondary | ICD-10-CM | POA: Insufficient documentation

## 2017-09-13 DIAGNOSIS — I10 Essential (primary) hypertension: Secondary | ICD-10-CM | POA: Diagnosis not present

## 2017-09-13 DIAGNOSIS — R531 Weakness: Secondary | ICD-10-CM | POA: Diagnosis present

## 2017-09-13 DIAGNOSIS — Z87891 Personal history of nicotine dependence: Secondary | ICD-10-CM | POA: Diagnosis not present

## 2017-09-13 DIAGNOSIS — R002 Palpitations: Secondary | ICD-10-CM | POA: Diagnosis not present

## 2017-09-13 LAB — BASIC METABOLIC PANEL
Anion gap: 11 (ref 5–15)
BUN: 12 mg/dL (ref 6–20)
CALCIUM: 9.2 mg/dL (ref 8.9–10.3)
CO2: 22 mmol/L (ref 22–32)
Chloride: 104 mmol/L (ref 101–111)
Creatinine, Ser: 1.46 mg/dL — ABNORMAL HIGH (ref 0.44–1.00)
GFR calc Af Amer: 40 mL/min — ABNORMAL LOW (ref >60)
GFR calc non Af Amer: 35 mL/min — ABNORMAL LOW (ref >60)
GLUCOSE: 148 mg/dL — AB (ref 65–99)
POTASSIUM: 3.7 mmol/L (ref 3.5–5.1)
Sodium: 137 mmol/L (ref 135–145)

## 2017-09-13 LAB — CBC
HEMATOCRIT: 37 % (ref 36.0–46.0)
Hemoglobin: 12 g/dL (ref 12.0–15.0)
MCH: 27.2 pg (ref 26.0–34.0)
MCHC: 32.4 g/dL (ref 30.0–36.0)
MCV: 83.9 fL (ref 78.0–100.0)
Platelets: 194 10*3/uL (ref 150–400)
RBC: 4.41 MIL/uL (ref 3.87–5.11)
RDW: 13.7 % (ref 11.5–15.5)
WBC: 12.4 10*3/uL — ABNORMAL HIGH (ref 4.0–10.5)

## 2017-09-13 LAB — URINALYSIS, ROUTINE W REFLEX MICROSCOPIC
BILIRUBIN URINE: NEGATIVE
GLUCOSE, UA: NEGATIVE mg/dL
HGB URINE DIPSTICK: NEGATIVE
KETONES UR: NEGATIVE mg/dL
Leukocytes, UA: NEGATIVE
Nitrite: NEGATIVE
PH: 6 (ref 5.0–8.0)
Protein, ur: NEGATIVE mg/dL
Specific Gravity, Urine: 1.012 (ref 1.005–1.030)

## 2017-09-13 MED FILL — JULUCA 50-25 MG TAB: 50-25 | 90 days supply | Qty: 90 | Fill #2

## 2017-09-13 MED FILL — AMLODIPINE 2.5 MG TABLET: 2.5 | 30 days supply | Qty: 30 | Fill #0

## 2017-09-13 NOTE — ED Triage Notes (Signed)
Pt reports leg weakness and chest soreness onset today while shopping. States when this has happened in the past, her potassium was low.

## 2017-09-14 ENCOUNTER — Emergency Department (HOSPITAL_COMMUNITY): Payer: Medicare Other

## 2017-09-14 DIAGNOSIS — R531 Weakness: Secondary | ICD-10-CM | POA: Diagnosis not present

## 2017-09-14 LAB — I-STAT TROPONIN, ED: TROPONIN I, POC: 0 ng/mL (ref 0.00–0.08)

## 2017-09-14 NOTE — Discharge Instructions (Signed)
You were seen today for episodes of palpitations and generalized weakness.  Your workup is reassuring.  Follow-up closely with your primary physician.

## 2017-09-14 NOTE — ED Provider Notes (Signed)
[facility] EMERGENCY DEPARTMENT Provider Note   CSN: [id number] Arrival date & time: 09/13/17  1939     History   Chief Complaint Chief Complaint  Patient presents with  . Weakness    HPI Christine Dixon is a 72 y.o. female.  HPI  This is a 72 year old female with a history of hypertension and HIV who presents with generalized weakness, anxiety, palpitations.  Patient reports multiple episodes of similar symptoms in the past.  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  She states that she can be doing anything when she has sudden onset of "not feeling good" and palpitations.  It is usually self-limited.  She states that she has been diagnosed with anxiety but is hesitant to take Xanax.  She states that when she has these episodes she then can feel her blood pressure rising which makes her more anxious and makes her blood pressure rise more.  She denies any chest pain or shortness of breath.  She denies any recent illnesses or fevers.  Currently she states that she feels much better.  Past Medical History:  Diagnosis Date  . HIV infection ([place])   . Hypertension     Patient Active Problem List   Diagnosis Date Noted  . Papanicolaou smear of cervix with low grade squamous intraepithelial lesion (LGSIL) 10/08/2012  . 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 HIV DISEASE 07/22/2006  . HEPATITIS C 07/22/2006  . ANEMIA OF CHRONIC DISEASE 07/22/2006  . DYSPLASIA, CERVIX, MILD 07/22/2006  . DYSPLASIA, VAGINA 07/22/2006  . OSTEOPOROSIS 07/22/2006    Past Surgical History:  Procedure Laterality Date  . COLON SURGERY      OB History    Gravida Para Term Preterm AB Living   3 3 3          SAB TAB Ectopic Multiple Live Births                   Home Medications    Prior to Admission medications   Medication Sig Start Date End Date Taking? Authorizing Provider  ALPRAZolam Duanne Moron) 0.5 MG  tablet Take by mouth. 01/18/17 01/18/18  [provider]  amLODipine (NORVASC) 2.5 MG tablet TAKE 1 TABLET BY MOUTH ONCE DAILY 09/13/17   Carlyle Basques, MD  Dolutegravir-Rilpivirine (JULUCA) 50-25 MG TABS Take 1 tablet by mouth daily. With meal 02/02/17   Carlyle Basques, MD  escitalopram (LEXAPRO) 10 MG tablet Take 1/2 tablet PO nightly, then can increase to full tablet PO nightly if tolerating well 01/18/17   [provider]  multivitamin-iron-minerals-folic acid (CENTRUM) chewable tablet Chew 1 tablet by mouth daily. Reported on 09/09/2015    [provider]    Family History Family History  Problem Relation Age of Onset  . Kidney disease Mother   . Sickle cell anemia Father   . Diabetes Maternal Grandmother   . Heart disease Paternal Grandmother     Social History Social History   Tobacco Use  . Smoking status: Former Smoker    Types: Cigarettes    Last attempt to quit: 06/29/1968    Years since quitting: 49.2  . Smokeless tobacco: Never Used  Substance Use Topics  . Alcohol use: No  . Drug use: No     Allergies   Aspirin and Codeine   Review of Systems Review of Systems  Constitutional: Negative for fever.  Respiratory: Negative for shortness of breath.  Cardiovascular: Positive for palpitations. Negative for chest pain.  Gastrointestinal: Negative for abdominal pain, nausea and vomiting.  Genitourinary: Negative for dysuria.  Neurological: Positive for weakness.  Psychiatric/Behavioral: The patient is nervous/anxious.   All other systems reviewed and are negative.    Physical Exam Updated Vital Signs BP (!) 158/76   Pulse 81   Temp 97.8 F (36.6 C)   Resp 18   Ht 5' (1.524 m)   Wt 47.6 kg (105 lb)   SpO2 97%   BMI 20.51 kg/m   Physical Exam  Constitutional: She is oriented to person, place, and time. She appears well-developed and well-nourished. No distress.  HENT:  Head: Normocephalic and atraumatic.  Eyes: Pupils are  equal, round, and reactive to light.  Cardiovascular: Normal rate, regular rhythm and normal heart sounds.  Pulmonary/Chest: Effort normal and breath sounds normal. No respiratory distress. She has no wheezes.  Abdominal: Soft. Bowel sounds are normal. There is no tenderness.  Neurological: She is alert and oriented to person, place, and time.  Cranial nerves II through XII intact, 5 out of 5 strength in all 4 extremities, no dysmetria to finger-nose-finger  Skin: Skin is warm and dry.  Psychiatric: She has a normal mood and affect.  Nursing note and vitals reviewed.    ED Treatments / Results  Labs (all labs ordered are listed, but only abnormal results are displayed) Labs Reviewed  BASIC METABOLIC PANEL - Abnormal; Notable for the following components:      Result Value   Glucose, Bld 148 (*)    Creatinine, Ser 1.46 (*)    GFR calc non Af Amer 35 (*)    GFR calc Af Amer 40 (*)    All other components within normal limits  CBC - Abnormal; Notable for the following components:   WBC 12.4 (*)    All other components within normal limits  URINALYSIS, ROUTINE W REFLEX MICROSCOPIC  I-STAT TROPONIN, ED  CBG MONITORING, ED    EKG  EKG Interpretation  Date/Time:  Monday September 13 2017 19:47:28 EDT Ventricular Rate:  98 PR Interval:  124 QRS Duration: 84 QT Interval:  368 QTC Calculation: 469 R Axis:   5 Text Interpretation:  Normal sinus rhythm Nonspecific ST abnormality Abnormal ECG Confirmed by Thayer Jew [phone]) on 09/14/2017 4:30:19 AM       Radiology Dg Chest 2 View  Result Date: 09/14/2017 CLINICAL DATA:  Palpitations. EXAM: CHEST - 2 VIEW COMPARISON:  08/06/2004 FINDINGS: The cardiomediastinal contours are normal. Aortic arch atherosclerosis. Pulmonary vasculature is normal. No consolidation, pleural effusion, or pneumothorax. No acute osseous abnormalities are seen. IMPRESSION: 1. No acute finding. 2. Aortic atherosclerosis. Electronically Signed   By: Jeb Levering M.D.   On: 09/14/2017 05:47    Procedures Procedures (including critical care time)  Medications Ordered in ED Medications - No data to display   Initial Impression / Assessment and Plan / ED Course  I have reviewed the triage vital signs and the nursing notes.  Pertinent labs & imaging results that were available during my care of the patient were reviewed by me and considered in my medical decision making (see chart for details).     Patient presents with generalized weakness, palpitations, episodes of anxiety.  She is currently symptom-free and states she feels much better.  Vital signs notable for hypertension.  Initial blood pressure 025 systolic.  Repeat blood pressure 163/94.  She is nonfocal on my exam and her exam is benign.  EKG shows  no evidence of arrhythmia.  Lab work reviewed without significant metabolic derangement.  Troponin negative and chest x-ray without acute abnormality.  Episodes do seem to be fairly random but associated with anxiety.  Recommend close follow-up with her primary physician.  Also recommend cardiology follow-up for Holter monitoring given the palpitations.  No leg swelling or risk factors for blood clots.  After history, exam, and medical workup I feel the patient has been appropriately medically screened and is safe for discharge home. Pertinent diagnoses were discussed with the patient. Patient was given return precautions.   Final Clinical Impressions(s) / ED Diagnoses   Final diagnoses:  Generalized weakness  Palpitations    ED Discharge Orders    None       Trindon Dorton, Barbette Hair, MD 09/14/17 [phone]

## 2017-09-16 ENCOUNTER — Ambulatory Visit (INDEPENDENT_AMBULATORY_CARE_PROVIDER_SITE_OTHER): Payer: Medicare Other

## 2017-09-16 VITALS — BP 145/82 | HR 88

## 2017-09-16 DIAGNOSIS — I10 Essential (primary) hypertension: Secondary | ICD-10-CM | POA: Diagnosis present

## 2017-09-16 MED ORDER — AMLODIPINE BESYLATE 5 MG PO TABS: 5.0000 mg | ORAL_TABLET | Freq: Every day | ORAL | 11 refills | Status: DC

## 2017-09-16 MED ORDER — BUSPIRONE HCL 5 MG PO TABS: 5.0000 mg | ORAL_TABLET | Freq: Two times a day (BID) | ORAL | 3 refills | Status: DC

## 2017-09-16 MED FILL — AMLODIPINE BESYLATE 5 MG TA: 5 | 30 days supply | Qty: 30 | Fill #0

## 2017-09-16 MED FILL — busPIRone HCL 10 MG TABS: 10 | 30 days supply | Qty: 30 | Fill #0

## 2017-09-16 NOTE — Progress Notes (Signed)
HPI: Christine Dixon is a 72 y.o. female who is here to see pharmacy for her anxiety issue.  Allergies: Allergies  Allergen Reactions  . Aspirin     REACTION: due to Crohn's  . Codeine     Vitals: BP: 145/82 (03/21 1526) Pulse Rate: 88 (03/21 1526)  Past Medical History: Past Medical History:  Diagnosis Date  . HIV infection ([place])   . Hypertension     Social History: Social History   Socioeconomic History  . Marital status: Married    Spouse name: Not on file  . Number of children: Not on file  . Years of education: Not on file  . Highest education level: Not on file  Occupational History  . Not on file  Social Needs  . Financial resource strain: Not on file  . Food insecurity:    Worry: Not on file    Inability: Not on file  . Transportation needs:    Medical: Not on file    Non-medical: Not on file  Tobacco Use  . Smoking status: Former Smoker    Types: Cigarettes    Last attempt to quit: 06/29/1968    Years since quitting: 49.2  . Smokeless tobacco: Never Used  Substance and Sexual Activity  . Alcohol use: No  . Drug use: No  . Sexual activity: Not Currently    Partners: Male  Lifestyle  . Physical activity:    Days per week: Not on file    Minutes per session: Not on file  . Stress: Not on file  Relationships  . Social connections:    Talks on phone: Not on file    Gets together: Not on file    Attends religious service: Not on file    Active member of club or organization: Not on file    Attends meetings of clubs or organizations: Not on file    Relationship status: Not on file  Other Topics Concern  . Not on file  Social History Narrative  . Not on file    Previous Regimen: ATP, DTG/EPZ, Triumeq, DTG/RPV/3TC  Current Regimen: Juluca  Labs: HIV 1 RNA Quant (copies/mL)  Date Value  07/27/2017 44 (H)  06/04/2017 77 (H)  01/12/2017 43 (H)   CD4 T Cell Abs (/uL)  Date Value  01/12/2017 1,000  04/27/2016 850  08/28/2015 1,340   Hep  B S Ab (no units)  Date Value  06/17/2015 POS (A)   Hepatitis B Surface Ag (no units)  Date Value  08/23/2006 No   HCV Ab (no units)  Date Value  08/23/2006 Yes    CrCl: Estimated Creatinine Clearance: 25 mL/min (A) (by C-G formula based on SCr of 1.46 mg/dL (H)).  Lipids:    Component Value Date/Time   CHOL 155 01/12/2017 1116   TRIG 391 (H) 01/12/2017 1116   HDL 43 (L) 01/12/2017 1116   CHOLHDL 3.6 01/12/2017 1116   VLDL 78 (H) 01/12/2017 1116   LDLCALC 34 01/12/2017 1116    Assessment: Janith is currently doing well on her Juluca. We were asked to see her today for a medication evaluation for her anxiety issue. She presented to the ED a few days ago for anxiety and palpitation issue. She was r/o for an MI and recommended to get a Holter device for cardiac monitoring with cardiology. The BP at this visit is 145/82 today on her amlodipine 2.62m a day. It was even higher while she was in the ED (158/76). We are going to  increase her Norvasc to 43m today.   She has always refused her benzo for anxiety in the past because of her fear with abuse/addiction, ect. Convinced her to try Buspar instead. She agreed to try it. We will keep it a low dose. We will f/u up with her in 1 month for the BP check up and Buspar.    Recommendations:  Continue Juluca 1 daily Start Buspar 568mPO BID Increase Norvasc to 19m36mO qday F/u back up in 1 month   MinOnnie BoerharmD, BCPS, AAHIVP, CPP Clinical [facility] 09/16/2017, 3:45 PM

## 2017-09-30 ENCOUNTER — Ambulatory Visit: Payer: Medicare Other | Admitting: Cardiovascular Disease

## 2017-10-08 ENCOUNTER — Encounter: Payer: Self-pay | Admitting: Internal Medicine

## 2017-10-14 ENCOUNTER — Ambulatory Visit (INDEPENDENT_AMBULATORY_CARE_PROVIDER_SITE_OTHER): Payer: Medicare Other | Admitting: Internal Medicine

## 2017-10-14 ENCOUNTER — Encounter: Payer: Self-pay | Admitting: Internal Medicine

## 2017-10-14 ENCOUNTER — Ambulatory Visit (HOSPITAL_COMMUNITY): Payer: Medicare Other | Attending: Cardiology

## 2017-10-14 ENCOUNTER — Other Ambulatory Visit: Payer: Self-pay

## 2017-10-14 VITALS — BP 154/72 | HR 105 | Ht 60.0 in | Wt 100.2 lb

## 2017-10-14 DIAGNOSIS — R0789 Other chest pain: Secondary | ICD-10-CM

## 2017-10-14 DIAGNOSIS — I129 Hypertensive chronic kidney disease with stage 1 through stage 4 chronic kidney disease, or unspecified chronic kidney disease: Secondary | ICD-10-CM | POA: Insufficient documentation

## 2017-10-14 DIAGNOSIS — N189 Chronic kidney disease, unspecified: Secondary | ICD-10-CM | POA: Diagnosis not present

## 2017-10-14 DIAGNOSIS — I472 Ventricular tachycardia, unspecified: Secondary | ICD-10-CM

## 2017-10-14 DIAGNOSIS — R079 Chest pain, unspecified: Secondary | ICD-10-CM | POA: Diagnosis not present

## 2017-10-14 DIAGNOSIS — R000 Tachycardia, unspecified: Secondary | ICD-10-CM | POA: Diagnosis not present

## 2017-10-14 DIAGNOSIS — R002 Palpitations: Secondary | ICD-10-CM | POA: Diagnosis present

## 2017-10-14 DIAGNOSIS — B20 Human immunodeficiency virus [HIV] disease: Secondary | ICD-10-CM | POA: Diagnosis not present

## 2017-10-14 DIAGNOSIS — R531 Weakness: Secondary | ICD-10-CM

## 2017-10-14 LAB — ECHOCARDIOGRAM COMPLETE
Height: 60 in
Weight: 1603.2 oz

## 2017-10-14 NOTE — Patient Instructions (Addendum)
Medication Instructions:  Your physician recommends that you continue on your current medications as directed. Please refer to the Current Medication list given to you today.  -- If you need a refill on your cardiac medications before your next appointment, please call your pharmacy. --  Labwork: MAGNESIUM/TSH  Testing/Procedures: ASAP Your physician has requested that you have an echocardiogram. Echocardiography is a painless test that uses sound waves to create images of your heart. It provides your doctor with information about the size and shape of your heart and how well your heart's chambers and valves are working. This procedure takes approximately one hour. There are no restrictions for this procedure.   Follow-Up: Your physician wants you to follow-up in: 2 MONTHS with Dr. Saunders Revel.    don't receive a letter, please call our office to schedule the follow-up appointment.  Thank you for choosing [facility]!!    Any Other Special Instructions Will Be Listed Below (If Applicable).

## 2017-10-14 NOTE — Progress Notes (Signed)
New Outpatient Visit Date: 10/14/2017  Primary Care Provider: Barrie Lyme, [address], [place] [address]  Chief Complaint: Weakness  HPI:  Christine Dixon is a 72 y.o. female who is being seen today for the evaluation of weakness following ED visit on 09/13/17. She has a history of hypertension, chronic kidney disease stage III, and HIV.  She presented to the emergency department complaining of generalized weakness, anxiety, and palpitations.  Evaluation was notable for initial systolic blood pressure of 180 mmHg, which improved to 163/94 on recheck.  She was discharged and advised to follow-up with her PCP and consider cardiology consultation.  Similar symptoms in the past have been attributed to anxiety.  [name] reports multiple episodes of a "sinking feeling" that occur randomly.  She has noted them for many years but feels like they have become more pronounced over the last 6 months, with 3-4 episodes occurring during that time.  She feels as though there is a heaviness coming over her shoulders with accompanying palpitations and "shaking feeling".  She also feels flushed and mildly short of breath with a "doom and gloom sensation."  She also has some indigestion at times during these episodes but no frank chest pain.  The episodes usually resolve over the course of a few minutes, though on the day of her ED admission last month, she felt unwell the entire day.  She denies lightheadedness, orthopnea, PND, and edema.  She reports having undergone some heart testing more than 10 years ago and believes that a stress test was normal.  She notes that her heart rate always seems to be a little bit faster than most people.  --------------------------------------------------------------------------------------------------  Cardiovascular History & Procedures: Cardiovascular Problems:  Palpitations  Risk Factors:  Hypertension, HIV, and age greater than  48  Cath/PCI:  None  CV Surgery:  None  EP Procedures and Devices:  None  Non-Invasive Evaluation(s):  None  Recent CV Pertinent Labs: Lab Results  Component Value Date   CHOL 155 01/12/2017   HDL 43 (L) 01/12/2017   LDLCALC 34 01/12/2017   TRIG 391 (H) 01/12/2017   CHOLHDL 3.6 01/12/2017   K 3.7 09/13/2017   BUN 12 09/13/2017   CREATININE 1.46 (H) 09/13/2017   CREATININE 1.41 (H) 07/27/2017    --------------------------------------------------------------------------------------------------  Past Medical History:  Diagnosis Date  . Chronic kidney disease, stage 3 ([place])   . Crohn's disease ([place])   . HIV infection ([place])   . Hypertension   . Sickle cell trait [facility])     Past Surgical History:  Procedure Laterality Date  . COLON SURGERY      Current Meds  Medication Sig  . amLODipine (NORVASC) 5 MG tablet Take 1 tablet (5 mg total) by mouth daily.  . Dolutegravir-Rilpivirine (JULUCA) 50-25 MG TABS Take 1 tablet by mouth daily. With meal  . multivitamin-iron-minerals-folic acid (CENTRUM) chewable tablet Chew 1 tablet by mouth daily. Reported on 09/09/2015    Allergies: Aspirin and Codeine  Social History   Tobacco Use  . Smoking status: Former Smoker    Types: Cigarettes    Last attempt to quit: 06/29/1968    Years since quitting: 49.3  . Smokeless tobacco: Never Used  Substance Use Topics  . Alcohol use: No  . Drug use: No    Family History  Problem Relation Age of Onset  . Kidney disease Mother   . Sickle cell anemia Father   . Diabetes Maternal Grandmother   .  Heart disease Paternal Grandmother     Review of Systems: A 12-system review of systems was performed and was negative except as noted in the HPI.  --------------------------------------------------------------------------------------------------  Physical Exam: BP (!) 154/72   Pulse (!) 105   Ht 5' (1.524 m)   Wt 100 lb 3.2 oz (45.5 kg)   BMI 19.57 kg/m   General: Thin  woman, seated comfortably in the exam room. HEENT: No conjunctival pallor or scleral icterus. Moist mucous membranes. OP clear. Neck: Supple without lymphadenopathy, thyromegaly, JVD, or HJR. No carotid bruit. Lungs: Normal work of breathing. Clear to auscultation bilaterally without wheezes or crackles. Heart: Tachycardic but regular without murmurs, rubs, or gallops. Non-displaced PMI. Abd: Bowel sounds present. Soft, NT/ND without hepatosplenomegaly Ext: No lower extremity edema. Radial, PT, and DP pulses are 2+ bilaterally Skin: Warm and dry without rash. Neuro: CNIII-XII intact. Strength and fine-touch sensation intact in upper and lower extremities bilaterally. Psych: Normal mood and affect.  EKG: Sinus tachycardia (heart rate 105 bpm) with possible left atrial enlargement and nonspecific ST segment changes.  Lab Results  Component Value Date   WBC 12.4 (H) 09/13/2017   HGB 12.0 09/13/2017   HCT 37.0 09/13/2017   MCV 83.9 09/13/2017   PLT 194 09/13/2017    Lab Results  Component Value Date   NA 137 09/13/2017   K 3.7 09/13/2017   CL 104 09/13/2017   CO2 22 09/13/2017   BUN 12 09/13/2017   CREATININE 1.46 (H) 09/13/2017   GLUCOSE 148 (H) 09/13/2017   ALT 31 (H) 01/12/2017    Lab Results  Component Value Date   CHOL 155 01/12/2017   HDL 43 (L) 01/12/2017   LDLCALC 34 01/12/2017   TRIG 391 (H) 01/12/2017   CHOLHDL 3.6 01/12/2017     --------------------------------------------------------------------------------------------------  ASSESSMENT AND PLAN: Weakness and atypical chest pain The long-standing episodes described by [name] could be due to a number of factors.  In the past, they have been attributed to anxiety and panic attacks, which certainly is possible.  However, she has some cardiac risk factors.  We have agreed to begin with a transthoracic echocardiogram.  If no significant structural abnormalities are seen, we will then obtain a myocardial perfusion  stress test.  Alternatively, if her EF is reduced or there is evidence of a regional wall motion abnormality, we may need to proceed directly to cardiac catheterization.  In the meantime, we will continue with primary prevention.  Palpitations and sinus tachycardia Rapid heart rate is a chronic issue for Ms. Abraha with more fluttering also noted during her "weakness" episodes.  I will check a TSH and magnesium level today.  Will defer event monitoring for now.  Chronic kidney disease stage III Long-standing and stable based on most recent labs during ED visit last month.  Avoid nephrotoxic drugs.  We will try to avoid IV contrast, if possible.  Further management per her PCP.  Follow-up: Return to clinic in 2 months.  Nelva Bush, MD 10/14/2017 12:16 PM

## 2017-10-15 ENCOUNTER — Other Ambulatory Visit: Payer: Self-pay

## 2017-10-15 DIAGNOSIS — R002 Palpitations: Secondary | ICD-10-CM

## 2017-10-15 LAB — TSH: TSH: 1.51 u[IU]/mL (ref 0.450–4.500)

## 2017-10-15 LAB — MAGNESIUM: Magnesium: 2.2 mg/dL (ref 1.6–2.3)

## 2017-10-15 NOTE — Progress Notes (Unsigned)
Myocardial Lexiscan

## 2017-10-16 ENCOUNTER — Encounter: Payer: Self-pay | Admitting: Internal Medicine

## 2017-10-16 DIAGNOSIS — R0789 Other chest pain: Secondary | ICD-10-CM | POA: Insufficient documentation

## 2017-10-16 DIAGNOSIS — R000 Tachycardia, unspecified: Secondary | ICD-10-CM | POA: Insufficient documentation

## 2017-10-16 DIAGNOSIS — R002 Palpitations: Secondary | ICD-10-CM | POA: Insufficient documentation

## 2017-10-16 DIAGNOSIS — R531 Weakness: Secondary | ICD-10-CM | POA: Insufficient documentation

## 2017-10-18 ENCOUNTER — Other Ambulatory Visit: Payer: Self-pay | Admitting: *Deleted

## 2017-10-18 DIAGNOSIS — Z113 Encounter for screening for infections with a predominantly sexual mode of transmission: Secondary | ICD-10-CM

## 2017-10-18 DIAGNOSIS — B20 Human immunodeficiency virus [HIV] disease: Secondary | ICD-10-CM

## 2017-10-18 DIAGNOSIS — Z79899 Other long term (current) drug therapy: Secondary | ICD-10-CM

## 2017-10-19 ENCOUNTER — Other Ambulatory Visit (HOSPITAL_COMMUNITY): Payer: Medicare Other

## 2017-10-21 ENCOUNTER — Ambulatory Visit: Payer: Medicare Other

## 2017-10-21 ENCOUNTER — Ambulatory Visit (INDEPENDENT_AMBULATORY_CARE_PROVIDER_SITE_OTHER): Payer: Medicare Other | Admitting: Pharmacist Clinician (PhC)/ Clinical Pharmacy Specialist

## 2017-10-21 VITALS — BP 152/74 | HR 85

## 2017-10-21 DIAGNOSIS — B20 Human immunodeficiency virus [HIV] disease: Secondary | ICD-10-CM | POA: Diagnosis present

## 2017-10-21 DIAGNOSIS — I10 Essential (primary) hypertension: Secondary | ICD-10-CM

## 2017-10-21 MED ORDER — AMLODIPINE BESYLATE 10 MG PO TABS
10.0000 mg | ORAL_TABLET | Freq: Every day | ORAL | 11 refills | Status: DC
Start: 2017-10-21 — End: 2018-11-01

## 2017-10-21 MED ORDER — BUSPIRONE HCL 5 MG PO TABS: 5.0000 mg | ORAL_TABLET | Freq: Two times a day (BID) | ORAL | 3 refills | Status: DC

## 2017-10-21 MED FILL — busPIRone HCL 5 MG TABS: 5 | 30 days supply | Qty: 60 | Fill #0

## 2017-10-21 MED FILL — AMLODIPINE BESYLATE 10 MG T: 10 | 30 days supply | Qty: 30 | Fill #0

## 2017-10-21 NOTE — Progress Notes (Signed)
HPI: Christine Dixon is a 72 y.o. female who is here to see pharmacy for her BP and med titration follow up.   Allergies: Allergies  Allergen Reactions  . Aspirin     REACTION: due to Crohn's  . Codeine     Vitals: BP: 152/74 (04/25 1517) Pulse Rate: 85 (04/25 1517)  Past Medical History: Past Medical History:  Diagnosis Date  . Chronic kidney disease, stage 3 ([place])   . Crohn's disease ([place])   . HIV infection ([place])   . Hypertension   . Sickle cell trait ([place])     Social History: Social History   Socioeconomic History  . Marital status: Married    Spouse name: Not on file  . Number of children: Not on file  . Years of education: Not on file  . Highest education level: Not on file  Occupational History  . Not on file  Social Needs  . Financial resource strain: Not on file  . Food insecurity:    Worry: Not on file    Inability: Not on file  . Transportation needs:    Medical: Not on file    Non-medical: Not on file  Tobacco Use  . Smoking status: Former Smoker    Types: Cigarettes    Last attempt to quit: 06/29/1968    Years since quitting: 49.3  . Smokeless tobacco: Never Used  Substance and Sexual Activity  . Alcohol use: No  . Drug use: No  . Sexual activity: Not Currently    Partners: Male  Lifestyle  . Physical activity:    Days per week: Not on file    Minutes per session: Not on file  . Stress: Not on file  Relationships  . Social connections:    Talks on phone: Not on file    Gets together: Not on file    Attends religious service: Not on file    Active member of club or organization: Not on file    Attends meetings of clubs or organizations: Not on file    Relationship status: Not on file  Other Topics Concern  . Not on file  Social History Narrative  . Not on file    Previous Regimen: ATP, DTG/EPZ, Triumeq, DTG/RPV/3TC  Current Regimen: Juluca  Labs: HIV 1 RNA Quant (copies/mL)  Date Value  07/27/2017 44 (H)  06/04/2017 77 (H)   01/12/2017 43 (H)   CD4 T Cell Abs (/uL)  Date Value  01/12/2017 1,000  04/27/2016 850  08/28/2015 1,340   Hep B S Ab (no units)  Date Value  06/17/2015 POS (A)   Hepatitis B Surface Ag (no units)  Date Value  08/23/2006 No   HCV Ab (no units)  Date Value  08/23/2006 Yes    CrCl: CrCl cannot be calculated (Patient's most recent lab result is older than the maximum 21 days allowed.).  Lipids:    Component Value Date/Time   CHOL 155 01/12/2017 1116   TRIG 391 (H) 01/12/2017 1116   HDL 43 (L) 01/12/2017 1116   CHOLHDL 3.6 01/12/2017 1116   VLDL 78 (H) 01/12/2017 1116   LDLCALC 34 01/12/2017 1116    Assessment: Christine Dixon is here to see [place] to see if we need to titrate her BP and anti-anxiety meds. She is currently on Norvasc 5 mg daily. He BP is still a little above goal. She agreed to increase the norvasc to 70m daily. She previously saw someone at a [facility] primary care but she said that  it's usually based on a availability of providers there. Advised her to establish a primary provider in our system for more continuity of care. Primary care would be the provider to manage her HTN, dyslipidemia, etc. Gave her the intake packet to [place]. Once she turns that in, they will bring her in for an appt.  She decided to stop her Buspar because it gave her a feeling that she didn't like. She decided against benzos and other therapy in the past due to the potential for side effects. Counseled her on giving Buspar another chance. She agreed to it. She asked about her lipid panel that she had from last year. Discussed with her that once she established herself with primary care, she can continue the discussion with them.   Recommendations:  Increase Norvasc to 57m PO qday Restart Buspar 535mBID F/u with Dr. SnBaxter Flatteryn May [facility], BCPS, AAHIVP, CPP Clinical Infectious [place] Infectious  D[facility] 10/21/2017, 3:36 PM

## 2017-10-25 ENCOUNTER — Other Ambulatory Visit: Payer: Medicare Other

## 2017-10-25 DIAGNOSIS — B20 Human immunodeficiency virus [HIV] disease: Secondary | ICD-10-CM

## 2017-10-25 DIAGNOSIS — Z79899 Other long term (current) drug therapy: Secondary | ICD-10-CM

## 2017-10-25 DIAGNOSIS — Z113 Encounter for screening for infections with a predominantly sexual mode of transmission: Secondary | ICD-10-CM

## 2017-10-25 LAB — CBC WITH DIFFERENTIAL/PLATELET
BASOS PCT: 0.6 %
Basophils Absolute: 63 cells/uL (ref 0–200)
Eosinophils Absolute: 147 cells/uL (ref 15–500)
Eosinophils Relative: 1.4 %
HEMATOCRIT: 39.5 % (ref 35.0–45.0)
Hemoglobin: 13.1 g/dL (ref 11.7–15.5)
LYMPHS ABS: 3161 {cells}/uL (ref 850–3900)
MCH: 27.2 pg (ref 27.0–33.0)
MCHC: 33.2 g/dL (ref 32.0–36.0)
MCV: 82.1 fL (ref 80.0–100.0)
MPV: 12 fL (ref 7.5–12.5)
Monocytes Relative: 6.9 %
NEUTROS ABS: 6405 {cells}/uL (ref 1500–7800)
Neutrophils Relative %: 61 %
PLATELETS: 228 10*3/uL (ref 140–400)
RBC: 4.81 10*6/uL (ref 3.80–5.10)
RDW: 12.8 % (ref 11.0–15.0)
Total Lymphocyte: 30.1 %
WBC mixed population: 725 cells/uL (ref 200–950)
WBC: 10.5 10*3/uL (ref 3.8–10.8)

## 2017-10-25 LAB — LIPID PANEL
CHOL/HDL RATIO: 2.7 (calc) (ref <5.0)
CHOLESTEROL: 151 mg/dL (ref <200)
HDL: 56 mg/dL (ref >50)
LDL Cholesterol (Calc): 64 mg/dL (calc)
NON-HDL CHOLESTEROL (CALC): 95 mg/dL (ref <130)
TRIGLYCERIDES: 294 mg/dL — AB (ref <150)

## 2017-10-25 LAB — COMPREHENSIVE METABOLIC PANEL
AG Ratio: 1.4 (calc) (ref 1.0–2.5)
ALKALINE PHOSPHATASE (APISO): 73 U/L (ref 33–130)
ALT: 32 U/L — ABNORMAL HIGH (ref 6–29)
AST: 35 U/L (ref 10–35)
Albumin: 4.6 g/dL (ref 3.6–5.1)
BUN/Creatinine Ratio: 9 (calc) (ref 6–22)
BUN: 14 mg/dL (ref 7–25)
CO2: 26 mmol/L (ref 20–32)
CREATININE: 1.48 mg/dL — AB (ref 0.60–0.93)
Calcium: 9.4 mg/dL (ref 8.6–10.4)
Chloride: 102 mmol/L (ref 98–110)
Globulin: 3.3 g/dL (calc) (ref 1.9–3.7)
Glucose, Bld: 131 mg/dL — ABNORMAL HIGH (ref 65–99)
Potassium: 4 mmol/L (ref 3.5–5.3)
Sodium: 139 mmol/L (ref 135–146)
Total Bilirubin: 0.6 mg/dL (ref 0.2–1.2)
Total Protein: 7.9 g/dL (ref 6.1–8.1)

## 2017-10-26 LAB — T-HELPER CELL (CD4) - (RCID CLINIC ONLY)
CD4 % Helper T Cell: 29 % — ABNORMAL LOW (ref 33–55)
CD4 T CELL ABS: 890 /uL (ref 400–2700)

## 2017-10-26 LAB — RPR: RPR: NONREACTIVE

## 2017-10-27 LAB — HIV-1 RNA QUANT-NO REFLEX-BLD
HIV 1 RNA QUANT: 53 {copies}/mL — AB
HIV-1 RNA Quant, Log: 1.72 Log copies/mL — ABNORMAL HIGH

## 2017-10-28 ENCOUNTER — Telehealth (HOSPITAL_COMMUNITY): Payer: Self-pay | Admitting: *Deleted

## 2017-10-28 NOTE — Telephone Encounter (Signed)
Patient given detailed instructions per Myocardial Perfusion Study Information Sheet for the test on  11/02/17. Patient notified to arrive 15 minutes early and that it is imperative to arrive on time for appointment to keep from having the test rescheduled.  If you need to cancel or reschedule your appointment, please call the office within 24 hours of your appointment. . Patient verbalized understanding. Christine Dixon

## 2017-11-02 ENCOUNTER — Ambulatory Visit (HOSPITAL_COMMUNITY): Payer: Medicare Other | Attending: Internal Medicine

## 2017-11-02 DIAGNOSIS — R002 Palpitations: Secondary | ICD-10-CM | POA: Diagnosis not present

## 2017-11-02 DIAGNOSIS — I10 Essential (primary) hypertension: Secondary | ICD-10-CM | POA: Diagnosis not present

## 2017-11-02 DIAGNOSIS — R0602 Shortness of breath: Secondary | ICD-10-CM | POA: Diagnosis not present

## 2017-11-02 DIAGNOSIS — R0789 Other chest pain: Secondary | ICD-10-CM | POA: Diagnosis not present

## 2017-11-02 LAB — MYOCARDIAL PERFUSION IMAGING
CHL CUP NUCLEAR SRS: 0
CHL CUP NUCLEAR SSS: 2
CSEPPHR: 117 {beats}/min
LV dias vol: 47 mL (ref 46–106)
LV sys vol: 14 mL
RATE: 0.26
Rest HR: 107 {beats}/min
SDS: 2
TID: 0.8

## 2017-11-02 MED ORDER — REGADENOSON 0.4 MG/5ML IV SOLN
0.4000 mg | Freq: Once | INTRAVENOUS | Status: AC
  Administered 2017-11-02: 0.4 mg via INTRAVENOUS

## 2017-11-02 MED ORDER — TECHNETIUM TC 99M TETROFOSMIN IV KIT
10.3000 | PACK | Freq: Once | INTRAVENOUS | Status: AC | PRN
  Administered 2017-11-02: 10.3 via INTRAVENOUS
  Filled 2017-11-02: qty 11

## 2017-11-02 MED ORDER — TECHNETIUM TC 99M TETROFOSMIN IV KIT
31.8000 | PACK | Freq: Once | INTRAVENOUS | Status: AC | PRN
  Administered 2017-11-02: 31.8 via INTRAVENOUS
  Filled 2017-11-02: qty 32

## 2017-11-04 ENCOUNTER — Ambulatory Visit: Payer: Medicare Other

## 2017-11-08 ENCOUNTER — Encounter: Payer: Self-pay | Admitting: Internal Medicine

## 2017-11-08 ENCOUNTER — Ambulatory Visit (INDEPENDENT_AMBULATORY_CARE_PROVIDER_SITE_OTHER): Payer: Medicare Other | Admitting: Internal Medicine

## 2017-11-08 VITALS — BP 150/76 | HR 96 | Temp 98.9°F | Wt 102.0 lb

## 2017-11-08 DIAGNOSIS — M150 Primary generalized (osteo)arthritis: Secondary | ICD-10-CM | POA: Diagnosis not present

## 2017-11-08 DIAGNOSIS — M159 Polyosteoarthritis, unspecified: Secondary | ICD-10-CM

## 2017-11-08 DIAGNOSIS — B182 Chronic viral hepatitis C: Secondary | ICD-10-CM | POA: Diagnosis present

## 2017-11-08 DIAGNOSIS — Z23 Encounter for immunization: Secondary | ICD-10-CM

## 2017-11-08 DIAGNOSIS — B20 Human immunodeficiency virus [HIV] disease: Secondary | ICD-10-CM

## 2017-11-08 DIAGNOSIS — F410 Panic disorder [episodic paroxysmal anxiety] without agoraphobia: Secondary | ICD-10-CM

## 2017-11-08 MED ORDER — PNEUMOCOCCAL 13-VAL CONJ VACC IM SUSP
0.5000 mL | INTRAMUSCULAR | Status: AC
  Administered 2017-11-08: 0.5 mL via INTRAMUSCULAR

## 2017-11-08 NOTE — Progress Notes (Signed)
RFV: follow up for hiv diseas  Patient ID: Christine Dixon, female   DOB: [birth year], 72 y.o.   MRN: [id number]  HPI Rosaisela is a pleasant 72yo F with hiv-hep C co-infection. Hep C treated in addn to anxiety-HTN. CD 4 count of 890/VL 53, recently changed to juluca.  She had repeated episodes of palpitations some associated with anxiety attack/panic. She saw Dr End recently who did EKG and TTE which did not suggest need for event monitoring or cardiac cath at this time.  She does report being fatigued. 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  She describes that her anxiety has been improved as she continues to attend counseling with Marcie Bal, in clinic counselor.  Sochx: no smoking no drinking  Outpatient Encounter Medications as of 11/08/2017  Medication Sig  . amLODipine (NORVASC) 10 MG tablet Take 1 tablet (10 mg total) by mouth daily.  . Dolutegravir-Rilpivirine (JULUCA) 50-25 MG TABS Take 1 tablet by mouth daily. With meal  . multivitamin-iron-minerals-folic acid (CENTRUM) chewable tablet Chew 1 tablet by mouth daily. Reported on 09/09/2015  . busPIRone (BUSPAR) 5 MG tablet Take 1 tablet (5 mg total) by mouth 2 (two) times daily. (Patient not taking: Reported on 10/21/2017)   No facility-administered encounter medications on file as of 11/08/2017.      Patient Active Problem List   Diagnosis Date Noted  . Weakness 10/16/2017  . Atypical chest pain 10/16/2017  . Palpitations 10/16/2017  . Sinus tachycardia 10/16/2017  . Papanicolaou smear of cervix with low grade squamous intraepithelial lesion (LGSIL) 10/08/2012  . 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 HIV DISEASE 07/22/2006  . HEPATITIS C 07/22/2006  . ANEMIA OF CHRONIC DISEASE 07/22/2006  . DYSPLASIA, CERVIX, MILD 07/22/2006  . DYSPLASIA, VAGINA 07/22/2006  . OSTEOPOROSIS 07/22/2006     Health Maintenance Due  Topic Date Due  .  TETANUS/TDAP  03/13/1964  . COLONOSCOPY  10/22/2014     Review of Systems  Constitutional: Negative for fever, chills, diaphoresis, activity change, appetite change, fatigue and unexpected weight change.  HENT: Negative for congestion, sore throat, rhinorrhea, sneezing, trouble swallowing and sinus pressure.  Eyes: Negative for photophobia and visual disturbance.  Respiratory: Negative for cough, chest tightness, shortness of breath, wheezing and stridor.  Cardiovascular: Negative for chest pain, palpitations and leg swelling.  Gastrointestinal: Negative for nausea, vomiting, abdominal pain, diarrhea, constipation, blood in stool, abdominal distention and anal bleeding.  Genitourinary: Negative for dysuria, hematuria, flank pain and difficulty urinating.  Musculoskeletal: "I feel stiff in the mornings" arthritis to back ,and knees Skin: Negative for color change, pallor, rash and wound.  Neurological: Negative for dizziness, tremors, weakness and light-headedness.  Hematological: Negative for adenopathy. Does not bruise/bleed easily.  Psychiatric/Behavioral: + anxious.Negative for behavioral problems, confusion, sleep disturbance, dysphoric mood, decreased concentration and agitation.   Physical Exam   BP (!) 150/76   Pulse 96   Temp 98.9 F (37.2 C) (Oral)   Wt 102 lb (46.3 kg)   BMI 19.92 kg/m   Constitutional:  oriented to person, place, and time. appears well-developed and well-nourished. No distress.  HENT: [place]/AT, PERRLA, no scleral icterus Mouth/Throat: Oropharynx is clear and moist. No oropharyngeal exudate.  Cardiovascular: Normal rate, regular rhythm and normal heart sounds. Exam reveals no gallop and no friction rub.  No murmur heard.  Pulmonary/Chest: Effort normal and breath sounds normal. No respiratory distress.  has no wheezes.  Neck =  supple, no nuchal rigidity Abdominal: Soft. Bowel sounds are normal.  exhibits no distension. There is no tenderness.    Lymphadenopathy: no cervical adenopathy. No axillary adenopathy Neurological: alert and oriented to person, place, and time.  Skin: Skin is warm and dry. No rash noted. No erythema.  Psychiatric: a normal mood and affect.  behavior is normal.   Lab Results  Component Value Date   CD4TCELL 29 (L) 10/25/2017   Lab Results  Component Value Date   CD4TABS 890 10/25/2017   CD4TABS 1,000 01/12/2017   CD4TABS 850 04/27/2016   Lab Results  Component Value Date   HIV1RNAQUANT 53 (H) 10/25/2017   Lab Results  Component Value Date   HEPBSAB POS (A) 06/17/2015   Lab Results  Component Value Date   LABRPR NON-REACTIVE 10/25/2017    CBC Lab Results  Component Value Date   WBC 10.5 10/25/2017   RBC 4.81 10/25/2017   HGB 13.1 10/25/2017   HCT 39.5 10/25/2017   PLT 228 10/25/2017   MCV 82.1 10/25/2017   MCH 27.2 10/25/2017   MCHC 33.2 10/25/2017   RDW 12.8 10/25/2017   LYMPHSABS 3,161 10/25/2017   MONOABS 824 01/12/2017   EOSABS 147 10/25/2017    BMET Lab Results  Component Value Date   NA 139 10/25/2017   K 4.0 10/25/2017   CL 102 10/25/2017   CO2 26 10/25/2017   GLUCOSE 131 (H) 10/25/2017   BUN 14 10/25/2017   CREATININE 1.48 (H) 10/25/2017   CALCIUM 9.4 10/25/2017   GFRNONAA 35 (L) 09/13/2017   GFRAA 40 (L) 09/13/2017   RUQ [place]: IMPRESSION: Tiny polyp minimally changed from prior exam measuring less than 4 mm most likely benign not requiring follow-up. Plaque-like 7 mm mobile gallstone. No findings to suggest cholecystitis.  Liver of increased echogenicity consistent with fatty infiltration and/or hepatocellular disease. No focal liver lesion.   Assessment and Plan  Anxiety = continues to see janet with counseling.  hiv disease = continue with juluca.  Chronic hep C = treated, will need annual RUQ for [facility] surveillance. Next feb 2020  Health maintenance  = will give prevnar 13 today  Osteoarthritis = worse in the morning, improved thorughout the  day.can do acetominophen up to 2089m per day to see if any relief

## 2017-11-18 ENCOUNTER — Ambulatory Visit: Payer: Medicare Other

## 2017-11-23 MED FILL — AMLODIPINE BESYLATE 10 MG T: 10 | 30 days supply | Qty: 30 | Fill #1

## 2017-12-02 ENCOUNTER — Ambulatory Visit: Payer: Medicare Other

## 2017-12-02 MED FILL — JULUCA 50-25 MG TAB: 50-25 | 90 days supply | Qty: 90 | Fill #3

## 2017-12-16 ENCOUNTER — Encounter (INDEPENDENT_AMBULATORY_CARE_PROVIDER_SITE_OTHER): Payer: Self-pay

## 2017-12-16 ENCOUNTER — Encounter: Payer: Self-pay | Admitting: Internal Medicine

## 2017-12-16 ENCOUNTER — Ambulatory Visit (INDEPENDENT_AMBULATORY_CARE_PROVIDER_SITE_OTHER): Payer: Medicare Other | Admitting: Internal Medicine

## 2017-12-16 VITALS — BP 138/70 | HR 109 | Ht 60.0 in | Wt 98.0 lb

## 2017-12-16 DIAGNOSIS — I10 Essential (primary) hypertension: Secondary | ICD-10-CM | POA: Diagnosis not present

## 2017-12-16 DIAGNOSIS — R000 Tachycardia, unspecified: Secondary | ICD-10-CM

## 2017-12-16 DIAGNOSIS — R531 Weakness: Secondary | ICD-10-CM | POA: Diagnosis not present

## 2017-12-16 NOTE — Patient Instructions (Signed)
Medication Instructions:  Your physician recommends that you continue on your current medications as directed. Please refer to the Current Medication list given to you today.  -- If you need a refill on your cardiac medications before your next appointment, please call your pharmacy. --  Labwork: None ordered  Testing/Procedures: None ordered  Follow-Up: Your physician wants you to follow-up as needed with Dr. End.    Thank you for choosing [facility]!!    Any Other Special Instructions Will Be Listed Below (If Applicable).         

## 2017-12-16 NOTE — Progress Notes (Signed)
Follow-up Outpatient Visit Date: 12/16/2017  Primary Care Provider: Barrie Lyme, [address] [place] [address]  Chief Complaint: Follow-up weakness  HPI:  Christine Dixon is a 72 y.o. year-old female with history of HTN, CKD stage III, and HIV, who presents for follow-up of weakness.  I saw her in April, at which time she reported multiple episodes of a "sinking feeling" that would occur randomly.  She also noted intermittent flushing and mild shortness of breath with a "doom and gloom sensation."  We agreed to obtain echocardiogram and pharmacologic myocardial perfusion stress test, neither of which showed any significant abnormalities.  Today, Christine Dixon reports that she is feeling very well.  She has not had any further weakness or "gloom and doom" sensations.  She is able to ambulate without difficulty.  She has not had chest pain, shortness of breath, palpitations, lightheadedness, or edema.  She notes that her heart rate is frequently still fast but no different than usual.  Amlodipine was increased to 10 mg daily, which she is tolerating well.  --------------------------------------------------------------------------------------------------  Cardiovascular History & Procedures: Cardiovascular Problems:  Palpitations  Risk Factors:  Hypertension, HIV, and age greater than 51  Cath/PCI:  None  CV Surgery:  None  EP Procedures and Devices:  None  Non-Invasive Evaluation(s):  Pharmacologic MPI (11/02/2017): Low risk study without ischemia or scar.  LVEF greater than 65%.  TTE (10/14/2017): Normal LV size with mild LVH.  LVEF 60 to 65% with normal wall motion.  Grade 1 diastolic dysfunction.  Normal RV size and function.  No significant valvular abnormalities.   Recent CV Pertinent Labs: Lab Results  Component Value Date   CHOL 151 10/25/2017   HDL 56 10/25/2017   LDLCALC 64 10/25/2017   TRIG 294 (H) 10/25/2017   CHOLHDL 2.7 10/25/2017   K  4.0 10/25/2017   MG 2.2 10/14/2017   BUN 14 10/25/2017   CREATININE 1.48 (H) 10/25/2017    Past medical and surgical history were reviewed and updated in EPIC.  Current Meds  Medication Sig  . amLODipine (NORVASC) 10 MG tablet Take 1 tablet (10 mg total) by mouth daily.  . Dolutegravir-Rilpivirine (JULUCA) 50-25 MG TABS Take 1 tablet by mouth daily. With meal  . multivitamin-iron-minerals-folic acid (CENTRUM) chewable tablet Chew 1 tablet by mouth daily. Reported on 09/09/2015    Allergies: Aspirin and Codeine  Social History   Tobacco Use  . Smoking status: Former Smoker    Types: Cigarettes    Last attempt to quit: 06/29/1968    Years since quitting: 49.4  . Smokeless tobacco: Never Used  Substance Use Topics  . Alcohol use: No  . Drug use: No    Family History  Problem Relation Age of Onset  . Kidney disease Mother   . Sickle cell anemia Father   . Diabetes Maternal Grandmother   . Heart disease Paternal Grandmother     Review of Systems: A 12-system review of systems was performed and was negative except as noted in the HPI.  --------------------------------------------------------------------------------------------------  Physical Exam: BP 138/70   Pulse (!) 109   Ht 5' (1.524 m)   Wt 98 lb (44.5 kg)   SpO2 97%   BMI 19.14 kg/m  Repeat HR 92 bpm  General: Thin woman, seated comfortably in the exam room. HEENT: No conjunctival pallor or scleral icterus. Moist mucous membranes.  OP clear. Neck: Supple without lymphadenopathy, thyromegaly, JVD, or HJR. No carotid bruit. Lungs: Normal work of  breathing. Clear to auscultation bilaterally without wheezes or crackles. Heart: Regular rate and rhythm without murmurs, rubs, or gallops. Non-displaced PMI. Abd: Bowel sounds present. Soft, NT/ND without hepatosplenomegaly Ext: No lower extremity edema. Radial, PT, and DP pulses are 2+ bilaterally. Skin: Warm and dry without rash.  Lab Results  Component Value Date    WBC 10.5 10/25/2017   HGB 13.1 10/25/2017   HCT 39.5 10/25/2017   MCV 82.1 10/25/2017   PLT 228 10/25/2017    Lab Results  Component Value Date   NA 139 10/25/2017   K 4.0 10/25/2017   CL 102 10/25/2017   CO2 26 10/25/2017   BUN 14 10/25/2017   CREATININE 1.48 (H) 10/25/2017   GLUCOSE 131 (H) 10/25/2017   ALT 32 (H) 10/25/2017    Lab Results  Component Value Date   CHOL 151 10/25/2017   HDL 56 10/25/2017   LDLCALC 64 10/25/2017   TRIG 294 (H) 10/25/2017   CHOLHDL 2.7 10/25/2017    --------------------------------------------------------------------------------------------------  ASSESSMENT AND PLAN: Weakness Resolved.  No clear cardiac cause, with reassuring echo and stress test.  No further work-up at this time.  Tachycardia Sinus tachycardia noted in the past.  Initial heart rate mildly elevated at 109 bpm but down to 92 bpm on recheck after resting for a few minutes.  Given lack of symptoms and reassuring echo/stress test, no further work-up is indicated at this time.  Hypertension Blood pressure upper normal today.  Continue amlodipine 10 mg daily.  I will defer continued management to Ms. Mel Almond.  Follow-up: Return to clinic as needed.  Nelva Bush, MD 12/16/2017 9:10 PM

## 2017-12-20 MED FILL — AMLODIPINE BESYLATE 10 MG T: 10 | 30 days supply | Qty: 30 | Fill #2

## 2017-12-23 ENCOUNTER — Ambulatory Visit: Payer: Medicare Other

## 2018-01-06 ENCOUNTER — Ambulatory Visit: Payer: Medicare Other

## 2018-01-20 ENCOUNTER — Ambulatory Visit: Payer: Medicare Other

## 2018-01-21 MED FILL — AMLODIPINE BESYLATE 10 MG T: 10 | 30 days supply | Qty: 30 | Fill #3

## 2018-02-03 ENCOUNTER — Ambulatory Visit: Payer: Medicare Other

## 2018-02-17 ENCOUNTER — Ambulatory Visit: Payer: Medicare Other

## 2018-02-21 MED FILL — AMLODIPINE BESYLATE 10 MG T: 10 | 30 days supply | Qty: 30 | Fill #4

## 2018-02-24 ENCOUNTER — Ambulatory Visit: Payer: Medicare Other

## 2018-03-09 ENCOUNTER — Encounter: Payer: Self-pay | Admitting: Internal Medicine

## 2018-03-09 ENCOUNTER — Ambulatory Visit (INDEPENDENT_AMBULATORY_CARE_PROVIDER_SITE_OTHER): Payer: Medicare Other | Admitting: Internal Medicine

## 2018-03-09 VITALS — BP 159/84 | HR 116 | Temp 98.3°F | Wt 99.1 lb

## 2018-03-09 DIAGNOSIS — Z23 Encounter for immunization: Secondary | ICD-10-CM | POA: Diagnosis not present

## 2018-03-09 DIAGNOSIS — I10 Essential (primary) hypertension: Secondary | ICD-10-CM | POA: Diagnosis not present

## 2018-03-09 DIAGNOSIS — B20 Human immunodeficiency virus [HIV] disease: Secondary | ICD-10-CM

## 2018-03-09 DIAGNOSIS — N183 Chronic kidney disease, stage 3 unspecified: Secondary | ICD-10-CM

## 2018-03-09 NOTE — Progress Notes (Signed)
RFV: follow up for hiv disease  Patient ID: Christine Dixon, female   DOB: May 02, 1946, 72 y.o.   MRN: [id number]  HPI [name] is a 72yo F with hiv disease, on juluca. Doing well with adherence as usual. No health concerns. Sleeping ok. Feels that stress is manageable.  Outpatient Encounter Medications as of 03/09/2018  Medication Sig  . amLODipine (NORVASC) 10 MG tablet Take 1 tablet (10 mg total) by mouth daily.  . Dolutegravir-Rilpivirine (JULUCA) 50-25 MG TABS Take 1 tablet by mouth daily. With meal  . multivitamin-iron-minerals-folic acid (CENTRUM) chewable tablet Chew 1 tablet by mouth daily. Reported on 09/09/2015  . busPIRone (BUSPAR) 5 MG tablet Take 1 tablet (5 mg total) by mouth 2 (two) times daily. (Patient not taking: Reported on 03/09/2018)   No facility-administered encounter medications on file as of 03/09/2018.      Patient Active Problem List   Diagnosis Date Noted  . Essential hypertension 12/16/2017  . Weakness 10/16/2017  . Atypical chest pain 10/16/2017  . Palpitations 10/16/2017  . Tachycardia 10/16/2017  . Papanicolaou smear of cervix with low grade squamous intraepithelial lesion (LGSIL) 10/08/2012  . 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 HIV DISEASE 07/22/2006  . HEPATITIS C 07/22/2006  . ANEMIA OF CHRONIC DISEASE 07/22/2006  . DYSPLASIA, CERVIX, MILD 07/22/2006  . DYSPLASIA, VAGINA 07/22/2006  . OSTEOPOROSIS 07/22/2006     Health Maintenance Due  Topic Date Due  . TETANUS/TDAP  03/13/1964  . COLONOSCOPY  10/22/2014  . INFLUENZA VACCINE  01/27/2018     Review of Systems Review of Systems  Constitutional: Negative for fever, chills, diaphoresis, activity change, appetite change, fatigue and unexpected weight change.  HENT: Negative for congestion, sore throat, rhinorrhea, sneezing, trouble swallowing and sinus pressure.  Eyes: Negative for photophobia and visual disturbance.  Respiratory: Negative for cough, chest tightness, shortness of  breath, wheezing and stridor.  Cardiovascular: Negative for chest pain, palpitations and leg swelling.  Gastrointestinal: Negative for nausea, vomiting, abdominal pain, diarrhea, constipation, blood in stool, abdominal distention and anal bleeding.  Genitourinary: Negative for dysuria, hematuria, flank pain and difficulty urinating.  Musculoskeletal: Negative for myalgias, back pain, joint swelling, arthralgias and gait problem.  Skin: Negative for color change, pallor, rash and wound.  Neurological: Negative for dizziness, tremors, weakness and light-headedness.  Hematological: Negative for adenopathy. Does not bruise/bleed easily.  Psychiatric/Behavioral: Negative for behavioral problems, confusion, sleep disturbance, dysphoric mood, decreased concentration and agitation.    Physical Exam   BP (!) 159/84   Pulse (!) 116   Temp 98.3 F (36.8 C)   Wt 99 lb 1.9 oz (45 kg)   BMI 19.36 kg/m   Physical Exam  Constitutional:  oriented to person, place, and time. appears well-developed and well-nourished. No distress.  HENT: [place]/AT, PERRLA, no scleral icterus Mouth/Throat: Oropharynx is clear and moist. No oropharyngeal exudate.  Cardiovascular: Normal rate, regular rhythm and normal heart sounds. Exam reveals no gallop and no friction rub.  No murmur heard.  Pulmonary/Chest: Effort normal and breath sounds normal. No respiratory distress.  has no wheezes.  Neck = supple, no nuchal rigidity Abdominal: Soft. Bowel sounds are normal.  exhibits no distension. There is no tenderness.  Lymphadenopathy: no cervical adenopathy. No axillary adenopathy Neurological: alert and oriented to person, place, and time.  Skin: Skin is warm and dry. No rash noted. No erythema.  Psychiatric: a normal mood and affect.  behavior is normal.   Lab Results  Component Value Date   CD4TCELL 29 (L)  10/25/2017   Lab Results  Component Value Date   CD4TABS 890 10/25/2017   CD4TABS 1,000 01/12/2017   CD4TABS  850 04/27/2016   Lab Results  Component Value Date   HIV1RNAQUANT 53 (H) 10/25/2017   Lab Results  Component Value Date   HEPBSAB POS (A) 06/17/2015   Lab Results  Component Value Date   LABRPR NON-REACTIVE 10/25/2017    CBC Lab Results  Component Value Date   WBC 10.5 10/25/2017   RBC 4.81 10/25/2017   HGB 13.1 10/25/2017   HCT 39.5 10/25/2017   PLT 228 10/25/2017   MCV 82.1 10/25/2017   MCH 27.2 10/25/2017   MCHC 33.2 10/25/2017   RDW 12.8 10/25/2017   LYMPHSABS 3,161 10/25/2017   MONOABS 824 01/12/2017   EOSABS 147 10/25/2017    BMET Lab Results  Component Value Date   NA 139 10/25/2017   K 4.0 10/25/2017   CL 102 10/25/2017   CO2 26 10/25/2017   GLUCOSE 131 (H) 10/25/2017   BUN 14 10/25/2017   CREATININE 1.48 (H) 10/25/2017   CALCIUM 9.4 10/25/2017   GFRNONAA 35 (L) 09/13/2017   GFRAA 40 (L) 09/13/2017      Assessment and Plan  hiv disease = well controlled  ckd 3 = continue to monitor her kidney function. Will check ur microalb:cr ratios  htn =elevated at this visit, will watch if need to adjust meds at next visit  Health maintenance = gave flu shot

## 2018-03-10 ENCOUNTER — Ambulatory Visit: Payer: Medicare Other | Admitting: Internal Medicine

## 2018-03-10 LAB — T-HELPER CELL (CD4) - (RCID CLINIC ONLY)
CD4 T CELL ABS: 830 /uL (ref 400–2700)
CD4 T CELL HELPER: 35 % (ref 33–55)

## 2018-03-11 LAB — CBC WITH DIFFERENTIAL/PLATELET
BASOS ABS: 70 {cells}/uL (ref 0–200)
Basophils Relative: 0.6 %
EOS ABS: 105 {cells}/uL (ref 15–500)
Eosinophils Relative: 0.9 %
HEMATOCRIT: 37.6 % (ref 35.0–45.0)
HEMOGLOBIN: 12.6 g/dL (ref 11.7–15.5)
Lymphs Abs: 2352 cells/uL (ref 850–3900)
MCH: 27 pg (ref 27.0–33.0)
MCHC: 33.5 g/dL (ref 32.0–36.0)
MCV: 80.7 fL (ref 80.0–100.0)
MONOS PCT: 7.7 %
MPV: 12.4 fL (ref 7.5–12.5)
NEUTROS ABS: 8272 {cells}/uL — AB (ref 1500–7800)
NEUTROS PCT: 70.7 %
Platelets: 211 10*3/uL (ref 140–400)
RBC: 4.66 10*6/uL (ref 3.80–5.10)
RDW: 13.6 % (ref 11.0–15.0)
Total Lymphocyte: 20.1 %
WBC mixed population: 901 cells/uL (ref 200–950)
WBC: 11.7 10*3/uL — ABNORMAL HIGH (ref 3.8–10.8)

## 2018-03-11 LAB — HIV-1 RNA QUANT-NO REFLEX-BLD
HIV 1 RNA Quant: <20 copies/mL
HIV-1 RNA Quant, Log: <1.3 Log copies/mL

## 2018-03-14 ENCOUNTER — Other Ambulatory Visit: Payer: Self-pay | Admitting: Internal Medicine

## 2018-03-15 MED FILL — JULUCA 50-25 MG TAB: 50-25 | 90 days supply | Qty: 90 | Fill #0

## 2018-03-15 MED FILL — AMLODIPINE BESYLATE 10 MG T: 10 | 30 days supply | Qty: 30 | Fill #5

## 2018-03-17 ENCOUNTER — Ambulatory Visit: Payer: Medicare Other

## 2018-03-31 ENCOUNTER — Ambulatory Visit: Payer: Medicare Other

## 2018-04-14 ENCOUNTER — Ambulatory Visit: Payer: Medicare Other

## 2018-04-25 MED FILL — AMLODIPINE BESYLATE 10 MG T: 10 | 30 days supply | Qty: 30 | Fill #6

## 2018-04-28 ENCOUNTER — Ambulatory Visit: Payer: Medicare Other

## 2018-05-12 ENCOUNTER — Ambulatory Visit: Payer: Medicare Other

## 2018-05-23 MED FILL — AMLODIPINE BESYLATE 10 MG T: 10 | 30 days supply | Qty: 30 | Fill #7

## 2018-06-02 ENCOUNTER — Ambulatory Visit: Payer: Medicare Other

## 2018-06-08 MED FILL — JULUCA 50-25 MG TAB: 50-25 | 90 days supply | Qty: 90 | Fill #1

## 2018-06-09 ENCOUNTER — Ambulatory Visit: Payer: Medicare Other

## 2018-06-14 MED FILL — AMLODIPINE BESYLATE 10 MG T: 10 | 30 days supply | Qty: 30 | Fill #8

## 2018-06-30 ENCOUNTER — Ambulatory Visit: Payer: Medicare Other

## 2018-07-21 ENCOUNTER — Ambulatory Visit: Payer: Medicare Other

## 2018-07-26 MED FILL — AMLODIPINE BESYLATE 10 MG T: 10 | 30 days supply | Qty: 30 | Fill #9

## 2018-08-04 ENCOUNTER — Ambulatory Visit: Payer: Medicare Other

## 2018-08-11 ENCOUNTER — Ambulatory Visit: Payer: Medicare Other

## 2018-08-25 ENCOUNTER — Ambulatory Visit: Payer: Medicare Other

## 2018-08-28 IMAGING — US US ABDOMEN LIMITED
1 series · 14 of 25 positions shown · non-contrast
Comparison: 05/26/2016 ultrasound.

CLINICAL DATA: 71-year-old female with hepatitis C and
hypertension. Subsequent encounter.

EXAM:
ULTRASOUND ABDOMEN LIMITED RIGHT UPPER QUADRANT

[Series 1: us abdomen limited · 0.19mm/px · 14 of 67 slices shown]
[im 1/67]
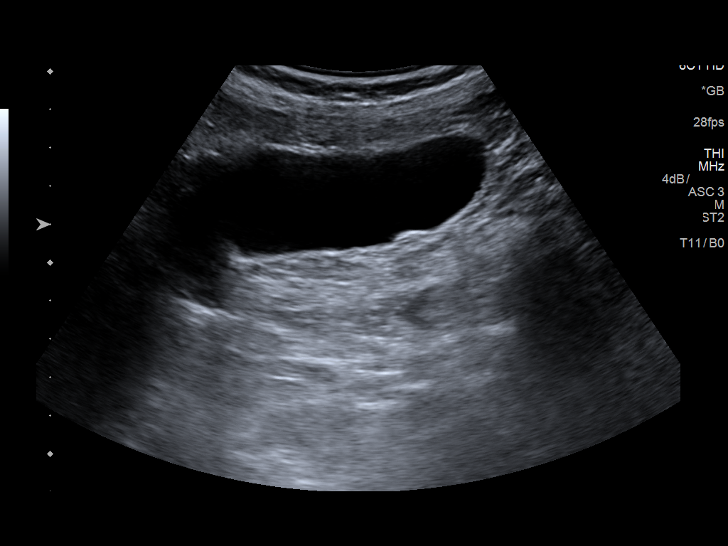
[im 6/67]
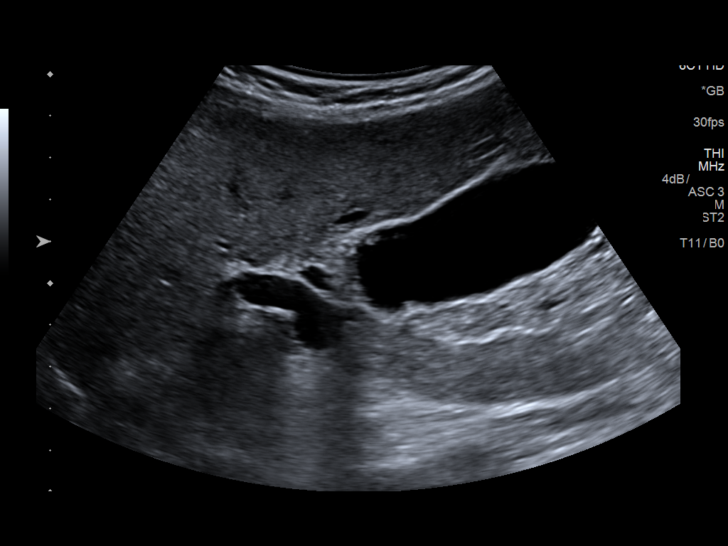
[im 12/67]
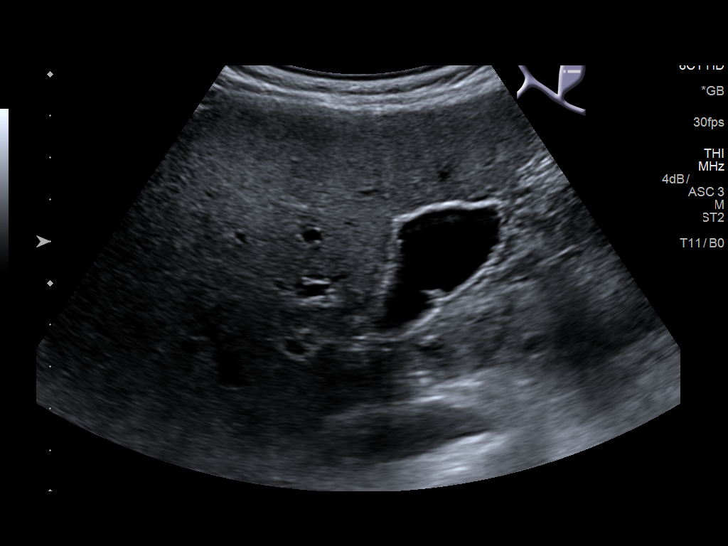
[im 17/67]
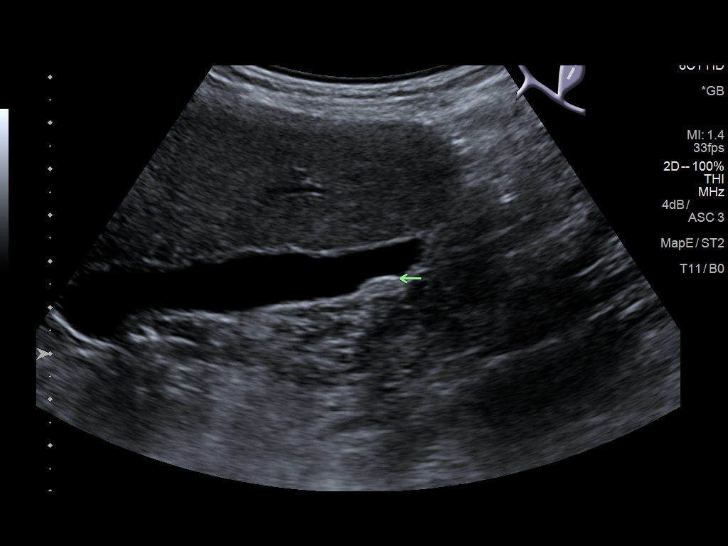
[im 23/67]
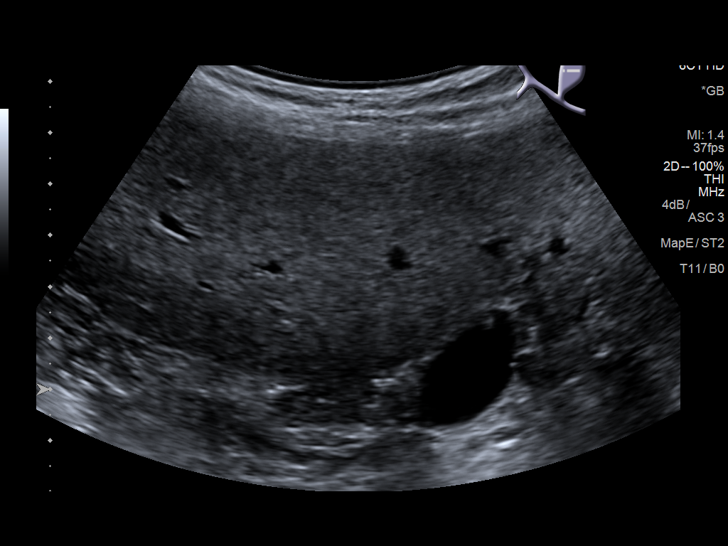
[im 25/67]
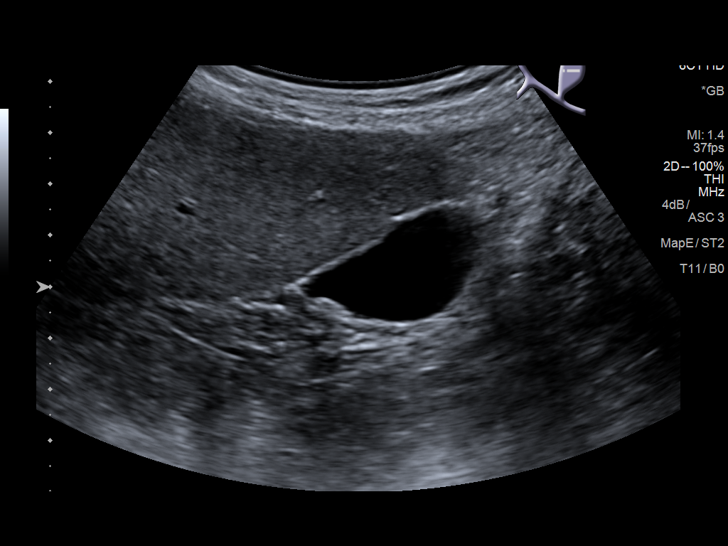
[im 31/67]
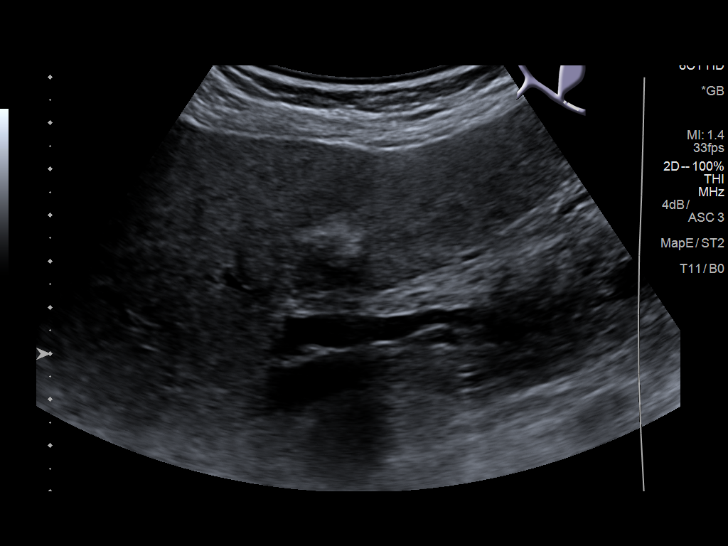
[im 36/67]
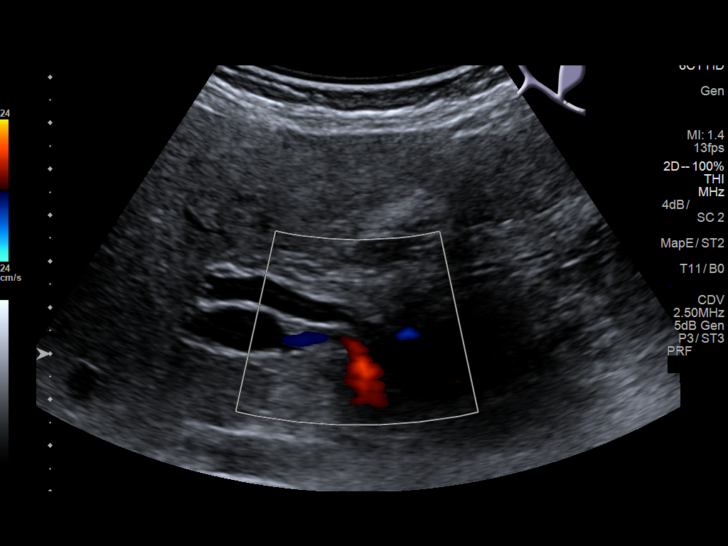
[im 42/67]
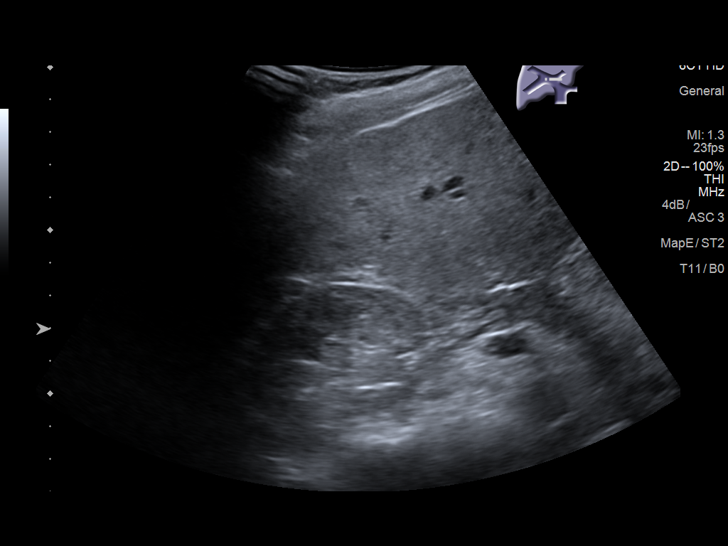
[im 45/67]
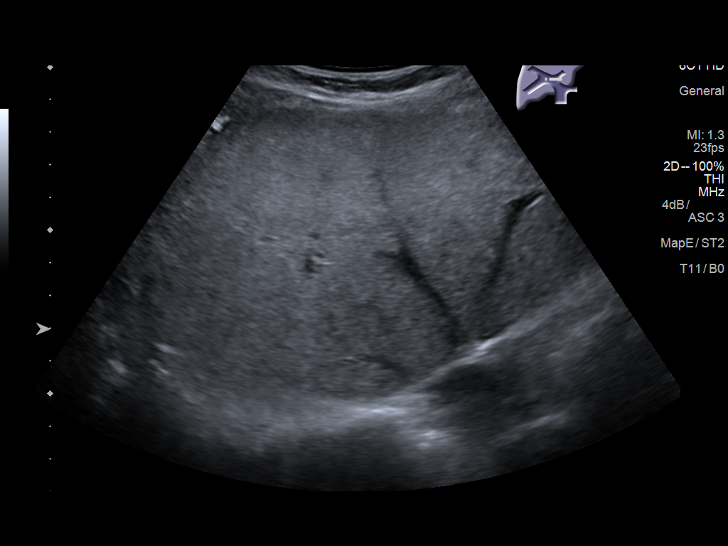
[im 50/67]
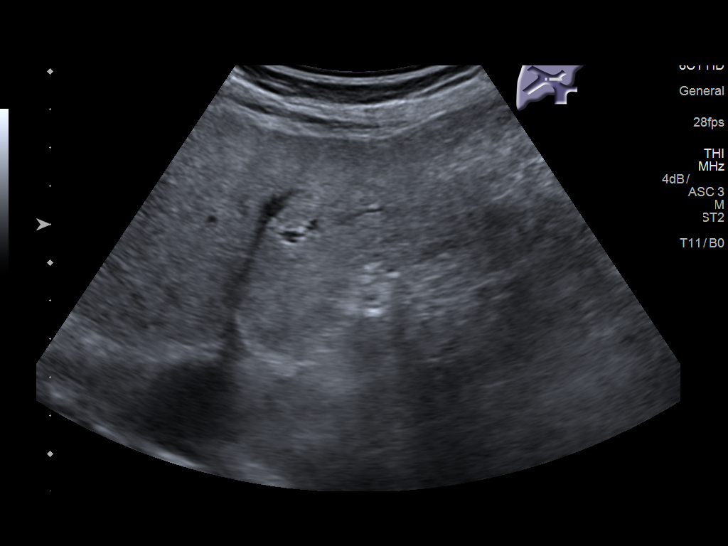
[im 56/67]
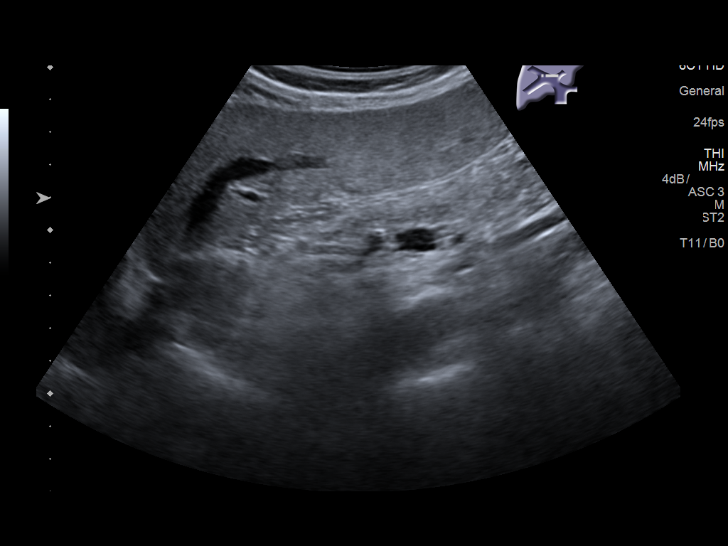
[im 61/67]
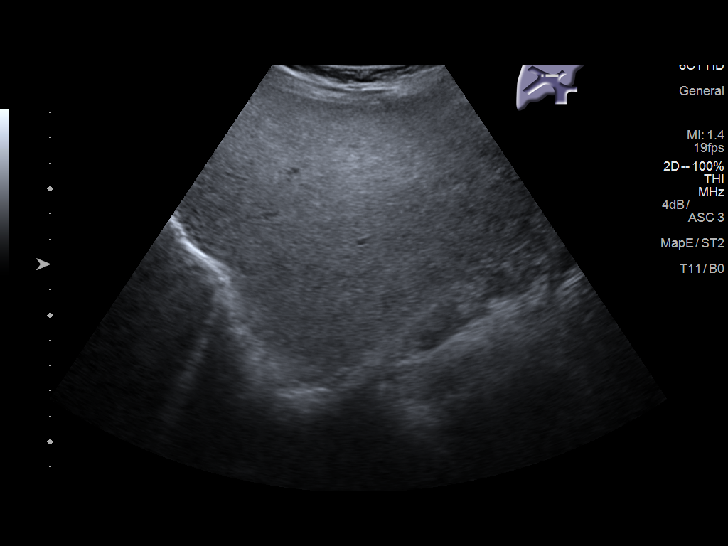
[im 67/67]
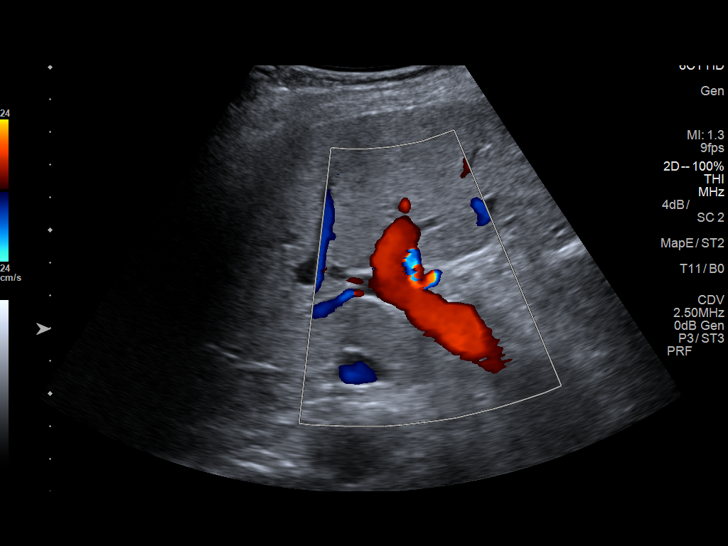

[14 of 25 positions shown; findings below may reference images not displayed]

FINDINGS: Gallbladder:

3.5 mm gallbladder polyp previously measuring 3 mm. Plaque like
mobile stone measuring up to 7 mm. No gallbladder wall thickening or
pericholecystic fluid. Patient was not tender over this region per
discussion with ultrasound technologist.

Common bile duct:

Diameter: 6.4 mm

Liver:

Liver of increased echogenicity consistent with fatty infiltration
and/or hepatocellular disease. No focal mass or intrahepatic biliary
duct dilation. Portal vein is patent on color Doppler imaging with
normal direction of blood flow towards the liver.
IMPRESSION: Tiny polyp minimally changed from prior exam measuring less than 4
mm most likely benign not requiring follow-up. Plaque-like 7 mm
mobile gallstone. No findings to suggest cholecystitis.

Liver of increased echogenicity consistent with fatty infiltration
and/or hepatocellular disease. No focal liver lesion.

## 2018-08-30 MED FILL — JULUCA 50-25 MG TAB: 50-25 | 90 days supply | Qty: 90 | Fill #2

## 2018-08-30 MED FILL — AMLODIPINE BESYLATE 10 MG T: 10 | 30 days supply | Qty: 30 | Fill #10

## 2018-09-07 ENCOUNTER — Ambulatory Visit: Payer: Medicare Other | Admitting: Internal Medicine

## 2018-09-08 ENCOUNTER — Other Ambulatory Visit: Payer: Self-pay

## 2018-09-08 ENCOUNTER — Ambulatory Visit: Payer: Medicare Other

## 2018-09-22 ENCOUNTER — Ambulatory Visit: Payer: Medicare Other

## 2018-09-22 ENCOUNTER — Other Ambulatory Visit: Payer: Self-pay

## 2018-10-03 MED FILL — AMLODIPINE BESYLATE 10 MG T: 10 | 30 days supply | Qty: 30 | Fill #11

## 2018-10-04 ENCOUNTER — Ambulatory Visit: Payer: Medicare Other | Admitting: Internal Medicine

## 2018-10-05 ENCOUNTER — Encounter: Payer: Self-pay | Admitting: Infectious Diseases

## 2018-10-05 ENCOUNTER — Ambulatory Visit (INDEPENDENT_AMBULATORY_CARE_PROVIDER_SITE_OTHER): Payer: Medicare Other | Admitting: Infectious Diseases

## 2018-10-05 ENCOUNTER — Other Ambulatory Visit: Payer: Self-pay

## 2018-10-05 DIAGNOSIS — B20 Human immunodeficiency virus [HIV] disease: Secondary | ICD-10-CM | POA: Diagnosis not present

## 2018-10-05 DIAGNOSIS — F410 Panic disorder [episodic paroxysmal anxiety] without agoraphobia: Secondary | ICD-10-CM

## 2018-10-05 NOTE — Assessment & Plan Note (Signed)
Acutely worsened due to current social isolation.  She will continue to touch base with her support system at home and work with Marcie Bal.

## 2018-10-05 NOTE — Progress Notes (Signed)
Name: Christine Dixon  JAS:[id number]   DOB: [birth year]   PCP: Barrie Lyme, FNP    Virtual Visit via Telephone Note  I connected with Christine Dixon on 10/05/18 at  3:45 PM EDT by telephone and verified that I am speaking with the correct person using two identifiers.   I discussed the limitations, risks, security and privacy concerns of performing an evaluation and management service by telephone and the availability of in person appointments. I also discussed with the patient that there may be a patient responsible charge related to this service. The patient expressed understanding and agreed to proceed.   Chief Complaint  Patient presents with   Follow-up    B20     History of Present Illness: Christine Dixon is a 73 y.o. female with HIV disease.  She last saw Dr. Graylon Good about 7 months ago.  She has had well-controlled infection with periods of low level viremia less than 100 copies.  Current regimen: Juluca   [name] tells me that physically she is doing very well.  She has no complaints or additions to her medical history.  She does however report an increase in her anxiety and depression since the recommendations to isolate due to the COVID-19 pandemic.  He tells me she has had a few panic attacks since this time.  She is working regularly with Marcie Bal our clinic counselor. She has had a lot of paranoia about going out.  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  She has a daughter that goes shopping out and about with her which she enjoys.  She wears a mask and gloves out in public and abides by current recommendations for social distancing.  She has no active symptoms that are concerning for COVID-19. She likes the Juluca and has not missed a single dose.  She tells me she takes it every night with her dinner just like Dr. Graylon Good told her to do.  She feels sometimes a "funny feeling" when she takes it but usually goes  away in 30 minutes.   She would like to know if her medication is working for her.  She has Crohn's disease which is currently in remission not on any maintenance medications but wonders from time to time if she has a hard time absorbing her medications.  Medical/surgical/social/family history have been updated during today's visit.    Observations/Objective: [name] is a very pleasant woman.  She sounds to be in good spirits on the phone but understandably nervous about current pandemic.  HIV 1 RNA Quant (copies/mL)  Date Value  03/09/2018 <20 NOT DETECTED  10/25/2017 53 (H)  07/27/2017 44 (H)   CD4 T Cell Abs (/uL)  Date Value  03/09/2018 830  10/25/2017 890  01/12/2017 1,000    Lab Results  Component Value Date   CREATININE 1.48 (H) 10/25/2017   CREATININE 1.46 (H) 09/13/2017   CREATININE 1.41 (H) 07/27/2017    Lab Results  Component Value Date   WBC 11.7 (H) 03/09/2018   HGB 12.6 03/09/2018   HCT 37.6 03/09/2018   MCV 80.7 03/09/2018   PLT 211 03/09/2018    Lab Results  Component Value Date   ALT 32 (H) 10/25/2017   AST 35 10/25/2017   ALKPHOS 70 01/12/2017   BILITOT 0.6 10/25/2017     Assessment and Plan: Problem List Items Addressed This Visit      Unprioritized  HIV DISEASE - Primary    I reviewed her lab work with her.  Lab work from 7 months ago reveal undetectable viral load.  Prior to this she has had a low level viremia less than 100 copies.  I encouraged her that her medication is working very well for her.  The best way for [place] to know is to have her come back and repeat her blood work in a couple of months.  I understand she does not want to come out of the house unless absolutely necessary at this time.  She will continue her Juluca 1 pill once a day with full meal.  I have arranged for her to follow-up with Dr. Graylon Good again in 3 months with lab work prior to her visit.  Spent a significant amount of time discussing her anxiety and current  recommendations with regards to keeping her healthy and COVID-19 pandemic.      Relevant Orders   T-helper cell (CD4)- ([facility] clinic only)   HIV-1 RNA quant-no reflex-bld   CBC   Comprehensive metabolic panel   Panic disorder    Acutely worsened due to current social isolation.  She will continue to touch base with her support system at home and work with Marcie Bal.         Follow Up Instructions: Return to clinic in 3 months with labs prior to her appointment.  These appointments have been made and communicated on the telephone today prior to ending the visit.   I discussed the assessment and treatment plan with the patient. The patient was provided an opportunity to ask questions and all were answered. The patient agreed with the plan and demonstrated an understanding of the instructions.   The patient was advised to call back or seek an in-person evaluation if the symptoms worsen or if the condition fails to improve as anticipated.  I provided 20 minutes of non-face-to-face time during this encounter.   Janene Madeira, MSN, NP-C [facility] for Infectious Disease [email or web address] Pager: [phone] Office: [place]: [phone]

## 2018-10-05 NOTE — Assessment & Plan Note (Signed)
I reviewed her lab work with her.  Lab work from 7 months ago reveal undetectable viral load.  Prior to this she has had a low level viremia less than 100 copies.  I encouraged her that her medication is working very well for her.  The best way for [place] to know is to have her come back and repeat her blood work in a couple of months.  I understand she does not want to come out of the house unless absolutely necessary at this time.  She will continue her Juluca 1 pill once a day with full meal.  I have arranged for her to follow-up with Dr. Graylon Good again in 3 months with lab work prior to her visit.  Spent a significant amount of time discussing her anxiety and current recommendations with regards to keeping her healthy and COVID-19 pandemic.

## 2018-10-06 ENCOUNTER — Other Ambulatory Visit: Payer: Self-pay

## 2018-10-06 ENCOUNTER — Ambulatory Visit: Payer: Medicare Other

## 2018-10-20 ENCOUNTER — Ambulatory Visit: Payer: Medicare Other

## 2018-10-20 ENCOUNTER — Other Ambulatory Visit: Payer: Self-pay

## 2018-11-01 ENCOUNTER — Other Ambulatory Visit: Payer: Self-pay | Admitting: Internal Medicine

## 2018-11-01 MED FILL — AMLODIPINE BESYLATE 10 MG T: 10 | 30 days supply | Qty: 30 | Fill #0

## 2018-12-07 MED FILL — JULUCA 50-25 MG TAB: 50-25 | 90 days supply | Qty: 90 | Fill #3

## 2018-12-07 MED FILL — AMLODIPINE BESYLATE 10 MG T: 10 | 30 days supply | Qty: 30 | Fill #1

## 2018-12-29 DIAGNOSIS — F411 Generalized anxiety disorder: Secondary | ICD-10-CM | POA: Insufficient documentation

## 2019-01-03 ENCOUNTER — Other Ambulatory Visit: Payer: Self-pay | Admitting: Physician Assistant

## 2019-01-03 DIAGNOSIS — E2839 Other primary ovarian failure: Secondary | ICD-10-CM

## 2019-01-03 DIAGNOSIS — Z1231 Encounter for screening mammogram for malignant neoplasm of breast: Secondary | ICD-10-CM

## 2019-01-09 ENCOUNTER — Other Ambulatory Visit: Payer: Self-pay

## 2019-01-09 ENCOUNTER — Other Ambulatory Visit: Payer: Medicare Other

## 2019-01-09 DIAGNOSIS — B20 Human immunodeficiency virus [HIV] disease: Secondary | ICD-10-CM

## 2019-01-10 LAB — T-HELPER CELL (CD4) - (RCID CLINIC ONLY)
CD4 % Helper T Cell: 27 % — ABNORMAL LOW (ref 33–65)
CD4 T Cell Abs: 788 /uL (ref 400–1790)

## 2019-01-10 MED FILL — AMLODIPINE BESYLATE 10 MG T: 10 | 30 days supply | Qty: 30 | Fill #2

## 2019-01-12 LAB — COMPREHENSIVE METABOLIC PANEL
AG Ratio: 1.3 (calc) (ref 1.0–2.5)
ALT: 30 U/L — ABNORMAL HIGH (ref 6–29)
AST: 30 U/L (ref 10–35)
Albumin: 4.3 g/dL (ref 3.6–5.1)
Alkaline phosphatase (APISO): 67 U/L (ref 37–153)
BUN/Creatinine Ratio: 10 (calc) (ref 6–22)
BUN: 13 mg/dL (ref 7–25)
CO2: 23 mmol/L (ref 20–32)
Calcium: 9.9 mg/dL (ref 8.6–10.4)
Chloride: 103 mmol/L (ref 98–110)
Creat: 1.34 mg/dL — ABNORMAL HIGH (ref 0.60–0.93)
Globulin: 3.3 g/dL (calc) (ref 1.9–3.7)
Glucose, Bld: 89 mg/dL (ref 65–99)
Potassium: 3.9 mmol/L (ref 3.5–5.3)
Sodium: 137 mmol/L (ref 135–146)
Total Bilirubin: 0.4 mg/dL (ref 0.2–1.2)
Total Protein: 7.6 g/dL (ref 6.1–8.1)

## 2019-01-12 LAB — CBC
HCT: 39.3 % (ref 35.0–45.0)
Hemoglobin: 12.9 g/dL (ref 11.7–15.5)
MCH: 26.9 pg — ABNORMAL LOW (ref 27.0–33.0)
MCHC: 32.8 g/dL (ref 32.0–36.0)
MCV: 82 fL (ref 80.0–100.0)
MPV: 11 fL (ref 7.5–12.5)
Platelets: 258 10*3/uL (ref 140–400)
RBC: 4.79 10*6/uL (ref 3.80–5.10)
RDW: 13.1 % (ref 11.0–15.0)
WBC: 13.4 10*3/uL — ABNORMAL HIGH (ref 3.8–10.8)

## 2019-01-12 LAB — HIV-1 RNA QUANT-NO REFLEX-BLD
HIV 1 RNA Quant: <20 copies/mL
HIV-1 RNA Quant, Log: <1.3 Log copies/mL

## 2019-01-13 ENCOUNTER — Other Ambulatory Visit: Payer: Self-pay | Admitting: Physician Assistant

## 2019-01-13 DIAGNOSIS — N6310 Unspecified lump in the right breast, unspecified quadrant: Secondary | ICD-10-CM

## 2019-01-23 ENCOUNTER — Other Ambulatory Visit: Payer: Self-pay

## 2019-01-23 ENCOUNTER — Ambulatory Visit (INDEPENDENT_AMBULATORY_CARE_PROVIDER_SITE_OTHER): Payer: Medicare Other | Admitting: Internal Medicine

## 2019-01-23 ENCOUNTER — Encounter: Payer: Self-pay | Admitting: Internal Medicine

## 2019-01-23 VITALS — BP 155/76 | HR 118 | Temp 98.3°F | Wt 96.0 lb

## 2019-01-23 DIAGNOSIS — I10 Essential (primary) hypertension: Secondary | ICD-10-CM | POA: Diagnosis not present

## 2019-01-23 DIAGNOSIS — N183 Chronic kidney disease, stage 3 unspecified: Secondary | ICD-10-CM

## 2019-01-23 DIAGNOSIS — B20 Human immunodeficiency virus [HIV] disease: Secondary | ICD-10-CM | POA: Diagnosis present

## 2019-01-23 NOTE — Progress Notes (Signed)
RFV: follow up on HIV disease  Patient ID: Christine Dixon, female   DOB: 04-Nov-1945, 73 y.o.   MRN: [id number]  HPI 73yo F with well controlled hiv disease, CD 4 count 788/VL<20, on juluca. She reports doing well. Has had excellent adherence. Maintains 3 W's. Wearing masks in public but not going out much. Has been in good health and no recent hospitalization. No sick contacts  Outpatient Encounter Medications as of 01/23/2019  Medication Sig  . amLODipine (NORVASC) 10 MG tablet TAKE 1 TABLET (10 MG TOTAL) BY MOUTH DAILY.  Marland Kitchen JULUCA 50-25 MG TABS TAKE 1 TABLET BY MOUTH DAILY WITH A MEAL  . multivitamin-iron-minerals-folic acid (CENTRUM) chewable tablet Chew 1 tablet by mouth daily. Reported on 09/09/2015  . busPIRone (BUSPAR) 5 MG tablet Take 1 tablet (5 mg total) by mouth 2 (two) times daily. (Patient not taking: Reported on 01/23/2019)   No facility-administered encounter medications on file as of 01/23/2019.      Patient Active Problem List   Diagnosis Date Noted  . Panic disorder 10/05/2018  . Essential hypertension 12/16/2017  . Weakness 10/16/2017  . Atypical chest pain 10/16/2017  . Palpitations 10/16/2017  . Tachycardia 10/16/2017  . Papanicolaou smear of cervix with low grade squamous intraepithelial lesion (LGSIL) 10/08/2012  . 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 HIV DISEASE 07/22/2006  . HEPATITIS C 07/22/2006  . ANEMIA OF CHRONIC DISEASE 07/22/2006  . DYSPLASIA, CERVIX, MILD 07/22/2006  . DYSPLASIA, VAGINA 07/22/2006  . OSTEOPOROSIS 07/22/2006     Health Maintenance Due  Topic Date Due  . TETANUS/TDAP  03/13/1964  . COLONOSCOPY  10/22/2014  . MAMMOGRAM  07/01/2018    Social History   Tobacco Use  . Smoking status: Former Smoker    Types: Cigarettes    Quit date: 06/29/1968    Years since quitting: 50.6  . Smokeless tobacco: Never Used  Substance Use Topics  . Alcohol use: No  . Drug use: No   Review of Systems  Constitutional: Negative for  fever, chills, diaphoresis, activity change, appetite change, fatigue and unexpected weight change.  HENT: Negative for congestion, sore throat, rhinorrhea, sneezing, trouble swallowing and sinus pressure.  Eyes: Negative for photophobia and visual disturbance.  Respiratory: Negative for cough, chest tightness, shortness of breath, wheezing and stridor.  Cardiovascular: Negative for chest pain, palpitations and leg swelling.  Gastrointestinal: Negative for nausea, vomiting, abdominal pain, diarrhea, constipation, blood in stool, abdominal distention and anal bleeding.  Genitourinary: Negative for dysuria, hematuria, flank pain and difficulty urinating.  Musculoskeletal: Negative for myalgias, back pain, joint swelling, arthralgias and gait problem.  Skin: Negative for color change, pallor, rash and wound.  Neurological: Negative for dizziness, tremors, weakness and light-headedness.  Hematological: Negative for adenopathy. Does not bruise/bleed easily.  Psychiatric/Behavioral: Negative for behavioral problems, confusion, sleep disturbance, dysphoric mood, decreased concentration and agitation.    Physical Exam   BP (!) 155/76   Pulse (!) 118   Temp 98.3 F (36.8 C) (Oral)   Wt 96 lb (43.5 kg)   BMI 18.75 kg/m   Physical Exam  Constitutional:  oriented to person, place, and time. appears well-developed and well-nourished. No distress.  HENT: [place]/AT, PERRLA, no scleral icterus Mouth/Throat: Oropharynx is clear and moist. No oropharyngeal exudate.  Cardiovascular: Normal rate, regular rhythm and normal heart sounds. Exam reveals no gallop and no friction rub.  No murmur heard.  Pulmonary/Chest: Effort normal and breath sounds normal. No respiratory distress.  has no wheezes.  Neck = supple,  no nuchal rigidity Abdominal: Soft. Bowel sounds are normal.  exhibits no distension. There is no tenderness.  Lymphadenopathy: no cervical adenopathy. No axillary adenopathy Neurological: alert and  oriented to person, place, and time.  Skin: Skin is warm and dry. No rash noted. No erythema.  Psychiatric: a normal mood and affect.  behavior is normal.   Lab Results  Component Value Date   CD4TCELL 27 (L) 01/09/2019   Lab Results  Component Value Date   CD4TABS 788 01/09/2019   CD4TABS 830 03/09/2018   CD4TABS 890 10/25/2017   Lab Results  Component Value Date   HIV1RNAQUANT <20 NOT DETECTED 01/09/2019   Lab Results  Component Value Date   HEPBSAB POS (A) 06/17/2015   Lab Results  Component Value Date   LABRPR NON-REACTIVE 10/25/2017    CBC Lab Results  Component Value Date   WBC 13.4 (H) 01/09/2019   RBC 4.79 01/09/2019   HGB 12.9 01/09/2019   HCT 39.3 01/09/2019   PLT 258 01/09/2019   MCV 82.0 01/09/2019   MCH 26.9 (L) 01/09/2019   MCHC 32.8 01/09/2019   RDW 13.1 01/09/2019   LYMPHSABS 2,352 03/09/2018   MONOABS 824 01/12/2017   EOSABS 105 03/09/2018    BMET Lab Results  Component Value Date   NA 137 01/09/2019   K 3.9 01/09/2019   CL 103 01/09/2019   CO2 23 01/09/2019   GLUCOSE 89 01/09/2019   BUN 13 01/09/2019   CREATININE 1.34 (H) 01/09/2019   CALCIUM 9.9 01/09/2019   GFRNONAA 35 (L) 09/13/2017   GFRAA 40 (L) 09/13/2017      Assessment and Plan  hiv disease= well controlled. Continue on juluca  htn = not at goal. Often wonder if some of pressentation is white coat syndrome. Will have her check bp at home  ckd 3=  Stable   Follow up in 6 mo  Flu vaccine in the fall

## 2019-01-31 ENCOUNTER — Ambulatory Visit
Admission: RE | Admit: 2019-01-31 | Discharge: 2019-01-31 | Disposition: A | Discharge status: Home / Self Care | Payer: Medicare Other | Source: Ambulatory Visit | Attending: Physician Assistant | Admitting: Physician Assistant

## 2019-01-31 ENCOUNTER — Other Ambulatory Visit: Payer: Self-pay

## 2019-01-31 ENCOUNTER — Ambulatory Visit: Admission: RE | Admit: 2019-01-31 | Payer: Medicare Other | Source: Ambulatory Visit

## 2019-01-31 DIAGNOSIS — N6310 Unspecified lump in the right breast, unspecified quadrant: Secondary | ICD-10-CM

## 2019-02-13 MED FILL — AMLODIPINE BESYLATE 10 MG T: 10 | 30 days supply | Qty: 30 | Fill #3

## 2019-03-10 ENCOUNTER — Other Ambulatory Visit: Payer: Self-pay | Admitting: Internal Medicine

## 2019-03-13 MED FILL — AMLODIPINE BESYLATE 10 MG T: 10 | 30 days supply | Qty: 30 | Fill #4

## 2019-03-20 MED FILL — JULUCA 50-25 MG TAB: 50-25 | 90 days supply | Qty: 90 | Fill #0

## 2019-03-28 ENCOUNTER — Ambulatory Visit
Admission: RE | Admit: 2019-03-28 | Discharge: 2019-03-28 | Disposition: A | Discharge status: Home / Self Care | Payer: Medicare Other | Source: Ambulatory Visit | Attending: Physician Assistant | Admitting: Physician Assistant

## 2019-03-28 ENCOUNTER — Other Ambulatory Visit: Payer: Self-pay

## 2019-03-28 DIAGNOSIS — E2839 Other primary ovarian failure: Secondary | ICD-10-CM

## 2019-04-11 ENCOUNTER — Other Ambulatory Visit: Payer: Self-pay

## 2019-04-11 ENCOUNTER — Ambulatory Visit (INDEPENDENT_AMBULATORY_CARE_PROVIDER_SITE_OTHER): Payer: Medicare Other

## 2019-04-11 DIAGNOSIS — Z23 Encounter for immunization: Secondary | ICD-10-CM

## 2019-04-26 MED FILL — AMLODIPINE BESYLATE 10 MG T: 10 | 30 days supply | Qty: 30 | Fill #5

## 2019-05-26 MED FILL — AMLODIPINE BESYLATE 10 MG T: 10 | 30 days supply | Qty: 30 | Fill #6

## 2019-06-26 MED FILL — AMLODIPINE BESYLATE 10 MG T: 10 | 30 days supply | Qty: 30 | Fill #7

## 2019-07-03 MED FILL — JULUCA 50-25 MG TAB: 50-25 | 90 days supply | Qty: 90 | Fill #1

## 2019-07-12 ENCOUNTER — Other Ambulatory Visit: Payer: Self-pay

## 2019-07-12 ENCOUNTER — Other Ambulatory Visit: Payer: Medicare Other

## 2019-07-12 DIAGNOSIS — B20 Human immunodeficiency virus [HIV] disease: Secondary | ICD-10-CM

## 2019-07-12 DIAGNOSIS — Z79899 Other long term (current) drug therapy: Secondary | ICD-10-CM

## 2019-07-12 DIAGNOSIS — Z113 Encounter for screening for infections with a predominantly sexual mode of transmission: Secondary | ICD-10-CM

## 2019-07-26 ENCOUNTER — Encounter: Payer: Medicare Other | Admitting: Internal Medicine

## 2019-08-07 MED FILL — AMLODIPINE BESYLATE 10 MG T: 10 | 30 days supply | Qty: 30 | Fill #8

## 2019-09-11 MED FILL — AMLODIPINE BESYLATE 10 MG T: 10 | 30 days supply | Qty: 30 | Fill #9

## 2019-09-12 ENCOUNTER — Other Ambulatory Visit: Payer: Medicare Other

## 2019-09-12 ENCOUNTER — Other Ambulatory Visit: Payer: Self-pay

## 2019-09-12 DIAGNOSIS — B20 Human immunodeficiency virus [HIV] disease: Secondary | ICD-10-CM

## 2019-09-12 DIAGNOSIS — Z113 Encounter for screening for infections with a predominantly sexual mode of transmission: Secondary | ICD-10-CM

## 2019-09-12 DIAGNOSIS — Z79899 Other long term (current) drug therapy: Secondary | ICD-10-CM

## 2019-09-13 LAB — T-HELPER CELL (CD4) - (RCID CLINIC ONLY)
CD4 % Helper T Cell: 28 % — ABNORMAL LOW (ref 33–65)
CD4 T Cell Abs: 812 /uL (ref 400–1790)

## 2019-09-14 LAB — CBC WITH DIFFERENTIAL/PLATELET
Absolute Monocytes: 882 cells/uL (ref 200–950)
Basophils Absolute: 53 cells/uL (ref 0–200)
Basophils Relative: 0.5 %
Eosinophils Absolute: 74 cells/uL (ref 15–500)
Eosinophils Relative: 0.7 %
HCT: 38.1 % (ref 35.0–45.0)
Hemoglobin: 12.6 g/dL (ref 11.7–15.5)
Lymphs Abs: 2856 cells/uL (ref 850–3900)
MCH: 27.2 pg (ref 27.0–33.0)
MCHC: 33.1 g/dL (ref 32.0–36.0)
MCV: 82.1 fL (ref 80.0–100.0)
MPV: 12 fL (ref 7.5–12.5)
Monocytes Relative: 8.4 %
Neutro Abs: 6636 cells/uL (ref 1500–7800)
Neutrophils Relative %: 63.2 %
Platelets: 242 10*3/uL (ref 140–400)
RBC: 4.64 10*6/uL (ref 3.80–5.10)
RDW: 13 % (ref 11.0–15.0)
Total Lymphocyte: 27.2 %
WBC: 10.5 10*3/uL (ref 3.8–10.8)

## 2019-09-14 LAB — HIV-1 RNA QUANT-NO REFLEX-BLD
HIV 1 RNA Quant: 21 copies/mL — ABNORMAL HIGH
HIV-1 RNA Quant, Log: 1.32 Log copies/mL — ABNORMAL HIGH

## 2019-09-14 LAB — LIPID PANEL
Cholesterol: 146 mg/dL (ref <200)
HDL: 52 mg/dL (ref >=50)
Non-HDL Cholesterol (Calc): 94 mg/dL (calc) (ref <130)
Total CHOL/HDL Ratio: 2.8 (calc) (ref <5.0)
Triglycerides: 443 mg/dL — ABNORMAL HIGH (ref <150)

## 2019-09-14 LAB — COMPREHENSIVE METABOLIC PANEL
AG Ratio: 1.4 (calc) (ref 1.0–2.5)
ALT: 27 U/L (ref 6–29)
AST: 25 U/L (ref 10–35)
Albumin: 4.4 g/dL (ref 3.6–5.1)
Alkaline phosphatase (APISO): 63 U/L (ref 37–153)
BUN/Creatinine Ratio: 11 (calc) (ref 6–22)
BUN: 16 mg/dL (ref 7–25)
CO2: 23 mmol/L (ref 20–32)
Calcium: 9.6 mg/dL (ref 8.6–10.4)
Chloride: 102 mmol/L (ref 98–110)
Creat: 1.43 mg/dL — ABNORMAL HIGH (ref 0.60–0.93)
Globulin: 3.1 g/dL (calc) (ref 1.9–3.7)
Glucose, Bld: 118 mg/dL — ABNORMAL HIGH (ref 65–99)
Potassium: 3.4 mmol/L — ABNORMAL LOW (ref 3.5–5.3)
Sodium: 139 mmol/L (ref 135–146)
Total Bilirubin: 0.5 mg/dL (ref 0.2–1.2)
Total Protein: 7.5 g/dL (ref 6.1–8.1)

## 2019-09-14 LAB — RPR: RPR Ser Ql: NONREACTIVE

## 2019-10-02 ENCOUNTER — Encounter: Payer: Medicare Other | Admitting: Internal Medicine

## 2019-10-03 ENCOUNTER — Ambulatory Visit (INDEPENDENT_AMBULATORY_CARE_PROVIDER_SITE_OTHER): Payer: Medicare Other | Admitting: Internal Medicine

## 2019-10-03 ENCOUNTER — Encounter: Payer: Self-pay | Admitting: Internal Medicine

## 2019-10-03 ENCOUNTER — Other Ambulatory Visit: Payer: Self-pay

## 2019-10-03 VITALS — BP 148/70 | HR 109 | Temp 98.0°F | Wt 97.6 lb

## 2019-10-03 DIAGNOSIS — B20 Human immunodeficiency virus [HIV] disease: Secondary | ICD-10-CM

## 2019-10-03 DIAGNOSIS — E782 Mixed hyperlipidemia: Secondary | ICD-10-CM | POA: Diagnosis not present

## 2019-10-03 DIAGNOSIS — B182 Chronic viral hepatitis C: Secondary | ICD-10-CM

## 2019-10-03 DIAGNOSIS — N183 Chronic kidney disease, stage 3 unspecified: Secondary | ICD-10-CM

## 2019-10-03 NOTE — Progress Notes (Signed)
Patient ID: Christine Dixon, female   DOB: 23-May-1946, 74 y.o.   MRN: [id number]  HPI hx of hiv disease, crohn's disease, htn. 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 CD 4 count of 812/VL 21 on juluca ,  Labs looks good however steady increase in TG.  She received first dose of the pfizer covid vaccine.   Outpatient Encounter Medications as of 10/03/2019  Medication Sig  . amLODipine (NORVASC) 10 MG tablet TAKE 1 TABLET (10 MG TOTAL) BY MOUTH DAILY.  Marland Kitchen JULUCA 50-25 MG TABS TAKE 1 TABLET BY MOUTH DAILY WITH A MEAL  . multivitamin-iron-minerals-folic acid (CENTRUM) chewable tablet Chew 1 tablet by mouth daily. Reported on 09/09/2015  . busPIRone (BUSPAR) 5 MG tablet Take 1 tablet (5 mg total) by mouth 2 (two) times daily. (Patient not taking: Reported on 01/23/2019)   No facility-administered encounter medications on file as of 10/03/2019.     Patient Active Problem List   Diagnosis Date Noted  . Panic disorder 10/05/2018  . Essential hypertension 12/16/2017  . Weakness 10/16/2017  . Atypical chest pain 10/16/2017  . Palpitations 10/16/2017  . Tachycardia 10/16/2017  . Papanicolaou smear of cervix with low grade squamous intraepithelial lesion (LGSIL) 10/08/2012  . 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 HIV DISEASE 07/22/2006  . HEPATITIS C 07/22/2006  . ANEMIA OF CHRONIC DISEASE 07/22/2006  . DYSPLASIA, CERVIX, MILD 07/22/2006  . DYSPLASIA, VAGINA 07/22/2006  . OSTEOPOROSIS 07/22/2006     Health Maintenance Due  Topic Date Due  . TETANUS/TDAP  Never done  . COLONOSCOPY  10/22/2014     Review of Systems  Constitutional: Negative for fever, chills, diaphoresis, activity change, appetite change, fatigue and unexpected weight change.  HENT: Negative for congestion, sore throat, rhinorrhea, sneezing, trouble swallowing and sinus pressure.  Eyes: Negative for photophobia and visual disturbance.  Respiratory: Negative for cough, chest tightness, shortness of breath,  wheezing and stridor.  Cardiovascular: Negative for chest pain, palpitations and leg swelling.  Gastrointestinal: Negative for nausea, vomiting, abdominal pain, diarrhea, constipation, blood in stool, abdominal distention and anal bleeding.  Genitourinary: Negative for dysuria, hematuria, flank pain and difficulty urinating.  Musculoskeletal: Negative for myalgias, back pain, joint swelling, arthralgias and gait problem.  Skin: Negative for color change, pallor, rash and wound.  Neurological: Negative for dizziness, tremors, weakness and light-headedness.  Hematological: Negative for adenopathy. Does not bruise/bleed easily.  Psychiatric/Behavioral: Negative for behavioral problems, confusion, sleep disturbance, dysphoric mood, decreased concentration and agitation.    Physical Exam   BP (!) 148/70   Pulse 98   Temp (!) 109 F (42.8 C) (Oral)   Wt 97 lb 9.6 oz (44.3 kg)   BMI 19.06 kg/m   Physical Exam  Constitutional:  oriented to person, place, and time. appears well-developed and well-nourished. No distress.  HENT: [place]/AT, PERRLA, no scleral icterus Mouth/Throat: Oropharynx is clear and moist. No oropharyngeal exudate.  Cardiovascular: Normal rate, regular rhythm and normal heart sounds. Exam reveals no gallop and no friction rub.  No murmur heard.  Pulmonary/Chest: Effort normal and breath sounds normal. No respiratory distress.  has no wheezes.  Neck = supple, no nuchal rigidity Abdominal: Soft. Bowel sounds are normal.  exhibits no distension. There is no tenderness.  Lymphadenopathy: no cervical adenopathy. No axillary adenopathy Neurological: alert and oriented to person, place, and time.  Skin: Skin is warm and dry. No rash noted. No erythema.  Psychiatric: a normal mood and affect.  behavior is normal.   Lab Results  Component Value Date   CD4TCELL 28 (L) 09/12/2019   Lab Results  Component Value Date   CD4TABS 812 09/12/2019   CD4TABS 788 01/09/2019   CD4TABS 830  03/09/2018   Lab Results  Component Value Date   HIV1RNAQUANT 21 (H) 09/12/2019   Lab Results  Component Value Date   HEPBSAB POS (A) 06/17/2015   Lab Results  Component Value Date   LABRPR NON-REACTIVE 09/12/2019    CBC Lab Results  Component Value Date   WBC 10.5 09/12/2019   RBC 4.64 09/12/2019   HGB 12.6 09/12/2019   HCT 38.1 09/12/2019   PLT 242 09/12/2019   MCV 82.1 09/12/2019   MCH 27.2 09/12/2019   MCHC 33.1 09/12/2019   RDW 13.0 09/12/2019   LYMPHSABS 2,856 09/12/2019   MONOABS 824 01/12/2017   EOSABS 74 09/12/2019    BMET Lab Results  Component Value Date   NA 139 09/12/2019   K 3.4 (L) 09/12/2019   CL 102 09/12/2019   CO2 23 09/12/2019   GLUCOSE 118 (H) 09/12/2019   BUN 16 09/12/2019   CREATININE 1.43 (H) 09/12/2019   CALCIUM 9.6 09/12/2019   GFRNONAA 35 (L) 09/13/2017   GFRAA 40 (L) 09/13/2017      Assessment and Plan  HIV disease = well controlled, continue on juluca  CKD 3 = stable,   Hyperlipidemia = she would like to try diet modification.  hcc surveillance = ultrasound of liver

## 2019-10-09 MED FILL — JULUCA 50-25 MG TAB: 50-25 | 90 days supply | Qty: 90 | Fill #2

## 2019-10-09 MED FILL — AMLODIPINE BESYLATE 10 MG T: 10 | 30 days supply | Qty: 30 | Fill #10

## 2019-10-13 ENCOUNTER — Ambulatory Visit (HOSPITAL_COMMUNITY): Payer: Medicare Other

## 2019-10-17 ENCOUNTER — Other Ambulatory Visit: Payer: Self-pay

## 2019-10-17 ENCOUNTER — Ambulatory Visit (HOSPITAL_COMMUNITY)
Admission: RE | Admit: 2019-10-17 | Discharge: 2019-10-17 | Disposition: A | Discharge status: Home / Self Care | Payer: Medicare Other | Source: Ambulatory Visit | Attending: Internal Medicine | Admitting: Internal Medicine

## 2019-10-17 DIAGNOSIS — B182 Chronic viral hepatitis C: Secondary | ICD-10-CM | POA: Insufficient documentation

## 2019-11-20 ENCOUNTER — Other Ambulatory Visit: Payer: Self-pay | Admitting: Internal Medicine

## 2019-11-20 MED FILL — AMLODIPINE BESYLATE 10 MG T: 10 | 30 days supply | Qty: 30 | Fill #0

## 2019-12-25 MED FILL — AMLODIPINE BESYLATE 10 MG T: 10 | 30 days supply | Qty: 30 | Fill #1

## 2020-01-18 MED FILL — JULUCA 50-25 MG TAB: 50-25 | 90 days supply | Qty: 90 | Fill #3

## 2020-01-18 MED FILL — AMLODIPINE BESYLATE 10 MG T: 10 | 30 days supply | Qty: 30 | Fill #2

## 2020-03-05 MED FILL — AMLODIPINE BESYLATE 10 MG T: 10 | 30 days supply | Qty: 30 | Fill #3

## 2020-03-11 ENCOUNTER — Other Ambulatory Visit: Payer: Self-pay | Admitting: Internal Medicine

## 2020-03-25 MED FILL — JULUCA 50-25 MG TAB: 50-25 | 90 days supply | Qty: 90 | Fill #0

## 2020-04-01 ENCOUNTER — Other Ambulatory Visit: Payer: Self-pay

## 2020-04-01 DIAGNOSIS — B20 Human immunodeficiency virus [HIV] disease: Secondary | ICD-10-CM

## 2020-04-03 ENCOUNTER — Other Ambulatory Visit: Payer: Self-pay | Admitting: Physician Assistant

## 2020-04-03 ENCOUNTER — Other Ambulatory Visit: Payer: Medicare Other

## 2020-04-03 DIAGNOSIS — Z1231 Encounter for screening mammogram for malignant neoplasm of breast: Secondary | ICD-10-CM

## 2020-04-04 ENCOUNTER — Other Ambulatory Visit: Payer: Medicare Other

## 2020-04-05 ENCOUNTER — Other Ambulatory Visit: Payer: Self-pay

## 2020-04-05 ENCOUNTER — Other Ambulatory Visit: Payer: Medicare Other

## 2020-04-05 DIAGNOSIS — B20 Human immunodeficiency virus [HIV] disease: Secondary | ICD-10-CM

## 2020-04-05 LAB — T-HELPER CELL (CD4) - (RCID CLINIC ONLY)
CD4 % Helper T Cell: 29 % — ABNORMAL LOW (ref 33–65)
CD4 T Cell Abs: 890 /uL (ref 400–1790)

## 2020-04-07 MED FILL — AMLODIPINE BESYLATE 10 MG T: 10 | 30 days supply | Qty: 30 | Fill #4

## 2020-04-08 LAB — CBC WITH DIFFERENTIAL/PLATELET
Absolute Monocytes: 788 cells/uL (ref 200–950)
Basophils Absolute: 56 cells/uL (ref 0–200)
Basophils Relative: 0.5 %
Eosinophils Absolute: 144 cells/uL (ref 15–500)
Eosinophils Relative: 1.3 %
HCT: 42.5 % (ref 35.0–45.0)
Hemoglobin: 14.2 g/dL (ref 11.7–15.5)
Lymphs Abs: 3308 cells/uL (ref 850–3900)
MCH: 27.7 pg (ref 27.0–33.0)
MCHC: 33.4 g/dL (ref 32.0–36.0)
MCV: 82.8 fL (ref 80.0–100.0)
MPV: 11.9 fL (ref 7.5–12.5)
Monocytes Relative: 7.1 %
Neutro Abs: 6804 cells/uL (ref 1500–7800)
Neutrophils Relative %: 61.3 %
Platelets: 283 10*3/uL (ref 140–400)
RBC: 5.13 10*6/uL — ABNORMAL HIGH (ref 3.80–5.10)
RDW: 12.8 % (ref 11.0–15.0)
Total Lymphocyte: 29.8 %
WBC: 11.1 10*3/uL — ABNORMAL HIGH (ref 3.8–10.8)

## 2020-04-08 LAB — COMPLETE METABOLIC PANEL WITH GFR
AG Ratio: 1.5 (calc) (ref 1.0–2.5)
ALT: 25 U/L (ref 6–29)
AST: 26 U/L (ref 10–35)
Albumin: 4.8 g/dL (ref 3.6–5.1)
Alkaline phosphatase (APISO): 66 U/L (ref 37–153)
BUN/Creatinine Ratio: 11 (calc) (ref 6–22)
BUN: 15 mg/dL (ref 7–25)
CO2: 28 mmol/L (ref 20–32)
Calcium: 10.1 mg/dL (ref 8.6–10.4)
Chloride: 101 mmol/L (ref 98–110)
Creat: 1.39 mg/dL — ABNORMAL HIGH (ref 0.60–0.93)
GFR, Est African American: 43 mL/min/{1.73_m2} — ABNORMAL LOW (ref >=60)
GFR, Est Non African American: 37 mL/min/{1.73_m2} — ABNORMAL LOW (ref >=60)
Globulin: 3.1 g/dL (calc) (ref 1.9–3.7)
Glucose, Bld: 140 mg/dL — ABNORMAL HIGH (ref 65–99)
Potassium: 4.8 mmol/L (ref 3.5–5.3)
Sodium: 140 mmol/L (ref 135–146)
Total Bilirubin: 0.6 mg/dL (ref 0.2–1.2)
Total Protein: 7.9 g/dL (ref 6.1–8.1)

## 2020-04-08 LAB — HIV-1 RNA QUANT-NO REFLEX-BLD
HIV 1 RNA Quant: 20 Copies/mL — ABNORMAL HIGH
HIV-1 RNA Quant, Log: 1.31 Log cps/mL — ABNORMAL HIGH

## 2020-04-18 ENCOUNTER — Ambulatory Visit (INDEPENDENT_AMBULATORY_CARE_PROVIDER_SITE_OTHER): Payer: Medicare Other | Admitting: Internal Medicine

## 2020-04-18 ENCOUNTER — Encounter: Payer: Self-pay | Admitting: Internal Medicine

## 2020-04-18 ENCOUNTER — Other Ambulatory Visit: Payer: Self-pay

## 2020-04-18 VITALS — BP 169/69 | HR 101 | Temp 98.8°F | Ht 60.0 in | Wt 94.0 lb

## 2020-04-18 DIAGNOSIS — I10 Essential (primary) hypertension: Secondary | ICD-10-CM | POA: Diagnosis not present

## 2020-04-18 DIAGNOSIS — B20 Human immunodeficiency virus [HIV] disease: Secondary | ICD-10-CM | POA: Diagnosis not present

## 2020-04-18 DIAGNOSIS — N183 Chronic kidney disease, stage 3 unspecified: Secondary | ICD-10-CM

## 2020-04-18 NOTE — Progress Notes (Signed)
RFV: follow up for hiv disease  Patient ID: Christine Dixon, female   DOB: [birth year], 74 y.o.   MRN: [id number]  HPI Jocelyn is a 74yo F with well controlled HIV disease, doing well with taking juluca daily. She has not had recent health problems. Continues to care for her husband.  Has taken covid vaccine.  Outpatient Encounter Medications as of 04/18/2020  Medication Sig   amLODipine (NORVASC) 10 MG tablet TAKE 1 TABLET (10 MG TOTAL) BY MOUTH DAILY.   JULUCA 50-25 MG TABS TAKE 1 TABLET BY MOUTH DAILY WITH A MEAL   multivitamin-iron-minerals-folic acid (CENTRUM) chewable tablet Chew 1 tablet by mouth daily. Reported on 09/09/2015   busPIRone (BUSPAR) 5 MG tablet Take 1 tablet (5 mg total) by mouth 2 (two) times daily. (Patient not taking: Reported on 01/23/2019)   No facility-administered encounter medications on file as of 04/18/2020.     Patient Active Problem List   Diagnosis Date Noted   Panic disorder 10/05/2018   Essential hypertension 12/16/2017   Weakness 10/16/2017   Atypical chest pain 10/16/2017   Palpitations 10/16/2017   Tachycardia 10/16/2017   Papanicolaou smear of cervix with low grade squamous intraepithelial lesion (LGSIL) 10/08/2012   CROHN'S DISEASE, SMALL INTESTINE 03/25/2010   HIV DISEASE 07/22/2006   HEPATITIS C 07/22/2006   ANEMIA OF CHRONIC DISEASE 07/22/2006   DYSPLASIA, CERVIX, MILD 07/22/2006   DYSPLASIA, VAGINA 07/22/2006   OSTEOPOROSIS 07/22/2006     Health Maintenance Due  Topic Date Due   TETANUS/TDAP  Never done   COLONOSCOPY  10/22/2014   COVID-19 Vaccine (2 - Pfizer 2-dose series) 10/10/2019   INFLUENZA VACCINE  01/28/2020     Review of Systems Review of Systems  Constitutional: Negative for fever, chills, diaphoresis, activity change, appetite change, fatigue and unexpected weight change.  HENT: Negative for congestion, sore throat, rhinorrhea, sneezing, trouble swallowing and sinus pressure.  Eyes:  Negative for photophobia and visual disturbance.  Respiratory: Negative for cough, chest tightness, shortness of breath, wheezing and stridor.  Cardiovascular: Negative for chest pain, palpitations and leg swelling.  Gastrointestinal: Negative for nausea, vomiting, abdominal pain, diarrhea, constipation, blood in stool, abdominal distention and anal bleeding.  Genitourinary: Negative for dysuria, hematuria, flank pain and difficulty urinating.  Musculoskeletal: Negative for myalgias, back pain, joint swelling, arthralgias and gait problem.  Skin: Negative for color change, pallor, rash and wound.  Neurological: Negative for dizziness, tremors, weakness and light-headedness.  Hematological: Negative for adenopathy. Does not bruise/bleed easily.  Psychiatric/Behavioral: Negative for behavioral problems, confusion, sleep disturbance, dysphoric mood, decreased concentration and agitation.    Physical Exam   BP (!) 169/69    Pulse (!) 101    Temp 98.8 F (37.1 C) (Oral)    Ht 5' (1.524 m)    Wt 94 lb (42.6 kg)    SpO2 100%    BMI 18.36 kg/m   Physical Exam  Constitutional:  oriented to person, place, and time. appears well-developed and well-nourished. No distress.  HENT: [place]/AT, PERRLA, no scleral icterus Mouth/Throat: Oropharynx is clear and moist. No oropharyngeal exudate.  Cardiovascular: Normal rate, regular rhythm and normal heart sounds. Exam reveals no gallop and no friction rub.  No murmur heard.  Pulmonary/Chest: Effort normal and breath sounds normal. No respiratory distress.  has no wheezes.  Neck = supple, no nuchal rigidity Lymphadenopathy: no cervical adenopathy. No axillary adenopathy Neurological: alert and oriented to person, place, and time.  Skin: Skin is warm and dry. No rash noted. No erythema.  Psychiatric: a normal mood and affect.  behavior is normal.   Lab Results  Component Value Date   CD4TCELL 29 (L) 04/05/2020   Lab Results  Component Value Date   CD4TABS  890 04/05/2020   CD4TABS 812 09/12/2019   CD4TABS 788 01/09/2019   Lab Results  Component Value Date   HIV1RNAQUANT 20 (H) 04/05/2020   Lab Results  Component Value Date   HEPBSAB POS (A) 06/17/2015   Lab Results  Component Value Date   LABRPR NON-REACTIVE 09/12/2019    CBC Lab Results  Component Value Date   WBC 11.1 (H) 04/05/2020   RBC 5.13 (H) 04/05/2020   HGB 14.2 04/05/2020   HCT 42.5 04/05/2020   PLT 283 04/05/2020   MCV 82.8 04/05/2020   MCH 27.7 04/05/2020   MCHC 33.4 04/05/2020   RDW 12.8 04/05/2020   LYMPHSABS 3,308 04/05/2020   MONOABS 824 01/12/2017   EOSABS 144 04/05/2020    BMET Lab Results  Component Value Date   NA 140 04/05/2020   K 4.8 04/05/2020   CL 101 04/05/2020   CO2 28 04/05/2020   GLUCOSE 140 (H) 04/05/2020   BUN 15 04/05/2020   CREATININE 1.39 (H) 04/05/2020   CALCIUM 10.1 04/05/2020   GFRNONAA 37 (L) 04/05/2020   GFRAA 43 (L) 04/05/2020      Assessment and Plan hiv disease = well controlled doing on juluca  ckd 3 = stable, cr at her baseline  Hypertension " white coat syndrome" -- she states at other visits, her BP is better, within range.  Health maintenance = come tomorrow for [employer] booster

## 2020-04-19 ENCOUNTER — Ambulatory Visit (INDEPENDENT_AMBULATORY_CARE_PROVIDER_SITE_OTHER): Payer: Medicare Other

## 2020-04-19 DIAGNOSIS — Z23 Encounter for immunization: Secondary | ICD-10-CM | POA: Diagnosis not present

## 2020-04-19 NOTE — Progress Notes (Signed)
   Covid-19 Vaccination Clinic  Name:  Christine Dixon    MRN: [id number] DOB: [birth year]  04/19/2020  Christine Dixon was observed post Covid-19 immunization for 15 minutes without incident. She was provided with Vaccine Information Sheet and instruction to access the V-Safe system.   Christine Dixon was instructed to call 911 with any severe reactions post vaccine: Marland Kitchen Difficulty breathing  . Swelling of face and throat  . A fast heartbeat  . A bad rash all over body  . Dizziness and weakness    Patient also advised to reschedule mammogram appointment due to receiving vaccine today. Christine Dixon

## 2020-04-25 ENCOUNTER — Ambulatory Visit: Payer: Medicare Other

## 2020-05-09 MED FILL — AMLODIPINE BESYLATE 10 MG T: 10 | 30 days supply | Qty: 30 | Fill #5

## 2020-06-03 ENCOUNTER — Ambulatory Visit: Payer: Medicare Other

## 2020-06-04 DIAGNOSIS — N1831 Chronic kidney disease, stage 3a: Secondary | ICD-10-CM | POA: Insufficient documentation

## 2020-06-07 MED FILL — AMLODIPINE BESYLATE 10 MG T: 10 | 30 days supply | Qty: 30 | Fill #6

## 2020-06-14 ENCOUNTER — Ambulatory Visit
Admission: RE | Admit: 2020-06-14 | Discharge: 2020-06-14 | Disposition: A | Discharge status: Home / Self Care | Payer: Medicare Other | Source: Ambulatory Visit | Attending: Physician Assistant | Admitting: Physician Assistant

## 2020-06-14 ENCOUNTER — Other Ambulatory Visit: Payer: Self-pay

## 2020-06-14 DIAGNOSIS — Z1231 Encounter for screening mammogram for malignant neoplasm of breast: Secondary | ICD-10-CM

## 2020-07-09 MED FILL — AMLODIPINE BESYLATE 10 MG T: 10 | 30 days supply | Qty: 30 | Fill #7

## 2020-07-30 ENCOUNTER — Other Ambulatory Visit: Payer: Self-pay | Admitting: Internal Medicine

## 2020-08-01 MED FILL — JULUCA 50-25 MG TAB: 50-25 | 90 days supply | Qty: 90 | Fill #0

## 2020-08-01 MED FILL — AMLODIPINE BESYLATE 10 MG T: 10 | 30 days supply | Qty: 30 | Fill #8

## 2020-09-17 MED FILL — AMLODIPINE BESYLATE 10 MG T: 10 | 30 days supply | Qty: 30 | Fill #9

## 2020-09-20 ENCOUNTER — Other Ambulatory Visit (HOSPITAL_COMMUNITY): Payer: Self-pay

## 2020-09-27 ENCOUNTER — Other Ambulatory Visit: Payer: Self-pay

## 2020-09-27 DIAGNOSIS — B20 Human immunodeficiency virus [HIV] disease: Secondary | ICD-10-CM

## 2020-09-27 DIAGNOSIS — Z113 Encounter for screening for infections with a predominantly sexual mode of transmission: Secondary | ICD-10-CM

## 2020-09-27 DIAGNOSIS — Z79899 Other long term (current) drug therapy: Secondary | ICD-10-CM

## 2020-10-02 ENCOUNTER — Other Ambulatory Visit (HOSPITAL_COMMUNITY)
Admission: RE | Admit: 2020-10-02 | Discharge: 2020-10-02 | Disposition: A | Discharge status: Home / Self Care | Payer: Medicare Other | Source: Ambulatory Visit | Attending: Internal Medicine | Admitting: Internal Medicine

## 2020-10-02 ENCOUNTER — Other Ambulatory Visit: Payer: Medicare Other

## 2020-10-02 ENCOUNTER — Other Ambulatory Visit: Payer: Self-pay

## 2020-10-02 DIAGNOSIS — B20 Human immunodeficiency virus [HIV] disease: Secondary | ICD-10-CM | POA: Diagnosis present

## 2020-10-02 DIAGNOSIS — Z113 Encounter for screening for infections with a predominantly sexual mode of transmission: Secondary | ICD-10-CM

## 2020-10-02 DIAGNOSIS — Z79899 Other long term (current) drug therapy: Secondary | ICD-10-CM

## 2020-10-03 LAB — URINE CYTOLOGY ANCILLARY ONLY
Chlamydia: NEGATIVE
Comment: NEGATIVE
Comment: NORMAL
Neisseria Gonorrhea: NEGATIVE

## 2020-10-03 LAB — T-HELPER CELL (CD4) - (RCID CLINIC ONLY)
CD4 % Helper T Cell: 31 % — ABNORMAL LOW (ref 33–65)
CD4 T Cell Abs: 710 /uL (ref 400–1790)

## 2020-10-05 LAB — LIPID PANEL
Cholesterol: 164 mg/dL (ref <200)
HDL: 57 mg/dL (ref >=50)
Non-HDL Cholesterol (Calc): 107 mg/dL (calc) (ref <130)
Total CHOL/HDL Ratio: 2.9 (calc) (ref <5.0)
Triglycerides: 424 mg/dL — ABNORMAL HIGH (ref <150)

## 2020-10-05 LAB — CBC WITH DIFFERENTIAL/PLATELET
Absolute Monocytes: 919 {cells}/uL (ref 200–950)
Basophils Absolute: 71 {cells}/uL (ref 0–200)
Basophils Relative: 0.7 %
Eosinophils Absolute: 51 {cells}/uL (ref 15–500)
Eosinophils Relative: 0.5 %
HCT: 41 % (ref 35.0–45.0)
Hemoglobin: 13.3 g/dL (ref 11.7–15.5)
Lymphs Abs: 2505 {cells}/uL (ref 850–3900)
MCH: 27.1 pg (ref 27.0–33.0)
MCHC: 32.4 g/dL (ref 32.0–36.0)
MCV: 83.7 fL (ref 80.0–100.0)
MPV: 12.1 fL (ref 7.5–12.5)
Monocytes Relative: 9.1 %
Neutro Abs: 6555 {cells}/uL (ref 1500–7800)
Neutrophils Relative %: 64.9 %
Platelets: 246 Thousand/uL (ref 140–400)
RBC: 4.9 Million/uL (ref 3.80–5.10)
RDW: 13.1 % (ref 11.0–15.0)
Total Lymphocyte: 24.8 %
WBC: 10.1 Thousand/uL (ref 3.8–10.8)

## 2020-10-05 LAB — COMPLETE METABOLIC PANEL WITHOUT GFR
AG Ratio: 1.4 (calc) (ref 1.0–2.5)
ALT: 26 U/L (ref 6–29)
AST: 24 U/L (ref 10–35)
Albumin: 4.7 g/dL (ref 3.6–5.1)
Alkaline phosphatase (APISO): 63 U/L (ref 37–153)
BUN/Creatinine Ratio: 10 (calc) (ref 6–22)
BUN: 13 mg/dL (ref 7–25)
CO2: 25 mmol/L (ref 20–32)
Calcium: 10.2 mg/dL (ref 8.6–10.4)
Chloride: 103 mmol/L (ref 98–110)
Creat: 1.36 mg/dL — ABNORMAL HIGH (ref 0.60–0.93)
GFR, Est African American: 44 mL/min/1.73m2 — ABNORMAL LOW
GFR, Est Non African American: 38 mL/min/1.73m2 — ABNORMAL LOW
Globulin: 3.3 g/dL (ref 1.9–3.7)
Glucose, Bld: 116 mg/dL — ABNORMAL HIGH (ref 65–99)
Potassium: 4.3 mmol/L (ref 3.5–5.3)
Sodium: 141 mmol/L (ref 135–146)
Total Bilirubin: 0.5 mg/dL (ref 0.2–1.2)
Total Protein: 8 g/dL (ref 6.1–8.1)

## 2020-10-05 LAB — HIV-1 RNA QUANT-NO REFLEX-BLD
HIV 1 RNA Quant: NOT DETECTED Copies/mL
HIV-1 RNA Quant, Log: NOT DETECTED Log cps/mL

## 2020-10-05 LAB — RPR: RPR Ser Ql: NONREACTIVE

## 2020-10-16 ENCOUNTER — Encounter: Payer: Medicare Other | Admitting: Internal Medicine

## 2020-10-17 ENCOUNTER — Other Ambulatory Visit (HOSPITAL_COMMUNITY): Payer: Self-pay

## 2020-10-17 MED FILL — Amlodipine Besylate Tab 10 MG (Base Equivalent): ORAL | 30 days supply | Qty: 30 | Fill #0 | Status: AC

## 2020-10-22 ENCOUNTER — Other Ambulatory Visit (HOSPITAL_COMMUNITY): Payer: Self-pay

## 2020-10-22 MED FILL — Amlodipine Besylate Tab 10 MG (Base Equivalent): ORAL | 30 days supply | Qty: 30 | Fill #1 | Status: AC

## 2020-10-28 ENCOUNTER — Other Ambulatory Visit (HOSPITAL_COMMUNITY): Payer: Self-pay

## 2020-10-31 ENCOUNTER — Other Ambulatory Visit (HOSPITAL_COMMUNITY): Payer: Self-pay

## 2020-11-01 ENCOUNTER — Other Ambulatory Visit (HOSPITAL_COMMUNITY): Payer: Self-pay

## 2020-11-07 ENCOUNTER — Other Ambulatory Visit (HOSPITAL_COMMUNITY): Payer: Self-pay

## 2020-11-08 ENCOUNTER — Other Ambulatory Visit: Payer: Self-pay

## 2020-11-08 ENCOUNTER — Ambulatory Visit (INDEPENDENT_AMBULATORY_CARE_PROVIDER_SITE_OTHER): Payer: Medicare Other | Admitting: Internal Medicine

## 2020-11-08 ENCOUNTER — Other Ambulatory Visit (HOSPITAL_COMMUNITY): Payer: Self-pay

## 2020-11-08 ENCOUNTER — Encounter: Payer: Self-pay | Admitting: Internal Medicine

## 2020-11-08 VITALS — BP 176/75 | HR 115 | Resp 16 | Ht 60.0 in | Wt 104.0 lb

## 2020-11-08 DIAGNOSIS — R000 Tachycardia, unspecified: Secondary | ICD-10-CM

## 2020-11-08 DIAGNOSIS — E782 Mixed hyperlipidemia: Secondary | ICD-10-CM

## 2020-11-08 DIAGNOSIS — N183 Chronic kidney disease, stage 3 unspecified: Secondary | ICD-10-CM

## 2020-11-08 DIAGNOSIS — B20 Human immunodeficiency virus [HIV] disease: Secondary | ICD-10-CM

## 2020-11-08 DIAGNOSIS — F411 Generalized anxiety disorder: Secondary | ICD-10-CM | POA: Diagnosis not present

## 2020-11-08 NOTE — Progress Notes (Signed)
RFV: follow up for hiv disease  Patient ID: Christine Dixon, female   DOB: [birth year], 75 y.o.   MRN: [id number]  HPI 75yo F with long standing hiv disease, CD 4 count of 710/VL<20 on DTG-RPV well controlled. She cares for her husband who is debilitated. Often feels stress from being care giver. She has hx of panic disorder.  This morning, she felt new onset having fast heart rate  Denies chest pressure. Nor any chest pain  (HR 127 -115 during this visit on vitals) - repeat HR 90 after 20 min discussion.  Outpatient Encounter Medications as of 11/08/2020  Medication Sig  . amLODipine (NORVASC) 10 MG tablet TAKE 1 TABLET (10 MG TOTAL) BY MOUTH DAILY.  . cyclobenzaprine (FLEXERIL) 5 MG tablet   . Dolutegravir-Rilpivirine 50-25 MG TABS TAKE 1 TABLET BY MOUTH DAILY WITH A MEAL  . multivitamin-iron-minerals-folic acid (CENTRUM) chewable tablet Chew 1 tablet by mouth daily. Reported on 09/09/2015  . vitamin B-12 (CYANOCOBALAMIN) 500 MCG tablet Take by mouth.  . Vitamin D, Ergocalciferol, (DRISDOL) 1.25 MG (50000 UNIT) CAPS capsule Take 1 capsule by mouth once a week.  . busPIRone (BUSPAR) 5 MG tablet Take 1 tablet (5 mg total) by mouth 2 (two) times daily. (Patient not taking: No sig reported)   No facility-administered encounter medications on file as of 11/08/2020.     Patient Active Problem List   Diagnosis Date Noted  . Chronic kidney disease, stage 3a ([place]) 06/04/2020  . GAD (generalized anxiety disorder) 12/29/2018  . Panic disorder 10/05/2018  . Essential hypertension 12/16/2017  . Weakness 10/16/2017  . Atypical chest pain 10/16/2017  . Palpitations 10/16/2017  . Tachycardia 10/16/2017  . Papanicolaou smear of cervix with low grade squamous intraepithelial lesion (LGSIL) 10/08/2012  . 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 Crohn's disease, small intestine ([place]) 03/25/2010  . HIV DISEASE 07/22/2006  . HEPATITIS C 07/22/2006  . ANEMIA OF CHRONIC DISEASE 07/22/2006  .  DYSPLASIA, CERVIX, MILD 07/22/2006  . DYSPLASIA, VAGINA 07/22/2006  . OSTEOPOROSIS 07/22/2006  . 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 Anemia of chronic disease 07/22/2006  . HIV disease ([place]) 07/22/2006  . Osteoporosis 07/22/2006     Health Maintenance Due  Topic Date Due  . TETANUS/TDAP  Never done  . COLONOSCOPY (Pts 45-53yr Insurance coverage will need to be confirmed)  10/22/2014  . COVID-19 Vaccine (3 - Pfizer risk 4-dose series) 05/17/2020    Sochx: no smoking or drinking  Review of Systems 12 point ros is negative except what is mentioned above Physical Exam  BP (!) 176/75   Pulse (!) 115   Resp 16   Ht 5' (1.524 m)   Wt 104 lb (47.2 kg)   SpO2 99%   BMI 20.31 kg/m  Physical Exam  Constitutional:  oriented to person, place, and time. appears well-developed and well-nourished. No distress.  HENT: [place]/AT, PERRLA, no scleral icterus Mouth/Throat: Oropharynx is clear and moist. No oropharyngeal exudate.  Cardiovascular: Normal rate, regular rhythm and normal heart sounds. Exam reveals no gallop and no friction rub.  No murmur heard.  Pulmonary/Chest: Effort normal and breath sounds normal. No respiratory distress.  has no wheezes.  Neck = supple, no nuchal rigidity Abdominal: Soft. Bowel sounds are normal.  exhibits no distension. There is no tenderness.  Lymphadenopathy: no cervical adenopathy. No axillary adenopathy Neurological: alert and oriented to person, place, and time.  Skin: Skin is warm and dry. No rash noted. No erythema.  Psychiatric: a normal mood and affect.  behavior is normal.    Lab Results  Component Value Date   CD4TCELL 31 (L) 10/02/2020   Lab Results  Component Value Date   CD4TABS 710 10/02/2020   CD4TABS 890 04/05/2020   CD4TABS 812 09/12/2019   Lab Results  Component Value Date   HIV1RNAQUANT Not Detected 10/02/2020   Lab Results  Component Value Date   HEPBSAB POS (A) 06/17/2015   Lab Results  Component Value Date   LABRPR  NON-REACTIVE 10/02/2020    CBC Lab Results  Component Value Date   WBC 10.1 10/02/2020   RBC 4.90 10/02/2020   HGB 13.3 10/02/2020   HCT 41.0 10/02/2020   PLT 246 10/02/2020   MCV 83.7 10/02/2020   MCH 27.1 10/02/2020   MCHC 32.4 10/02/2020   RDW 13.1 10/02/2020   LYMPHSABS 2,505 10/02/2020   MONOABS 824 01/12/2017   EOSABS 51 10/02/2020    BMET Lab Results  Component Value Date   NA 141 10/02/2020   K 4.3 10/02/2020   CL 103 10/02/2020   CO2 25 10/02/2020   GLUCOSE 116 (H) 10/02/2020   BUN 13 10/02/2020   CREATININE 1.36 (H) 10/02/2020   CALCIUM 10.2 10/02/2020   GFRNONAA 38 (L) 10/02/2020   GFRAA 44 (L) 10/02/2020    Assessment and Plan  HIV disease= continue on current regimen  CKD3 = at baseline   Hyper TG = consider starting fenofibrate 174m- she is wanting to try diet modification, increased fish intake and supplements such as omega 3.   htn = above goal, but suspect she has element of anxiety at this visit  Tachycardia = sinus, thought to be related to anxiety as her HR decreased considerably. Asked her to follow up with pcp. She may need holter monitor to evaluate for atypical HR, SVT.  GAD = continue on current SSRI

## 2020-11-11 ENCOUNTER — Other Ambulatory Visit (HOSPITAL_COMMUNITY): Payer: Self-pay

## 2020-11-11 MED FILL — Dolutegravir Sodium-Rilpivirine HCl Tab 50-25 MG (Base Eq): ORAL | 90 days supply | Qty: 90 | Fill #0 | Status: AC

## 2020-11-20 ENCOUNTER — Other Ambulatory Visit: Payer: Self-pay

## 2020-11-20 ENCOUNTER — Emergency Department (HOSPITAL_COMMUNITY)
Admission: EM | Admit: 2020-11-20 | Discharge: 2020-11-21 | Disposition: A | Discharge status: Home / Self Care | Payer: Medicare Other | Attending: Emergency Medicine | Admitting: Emergency Medicine

## 2020-11-20 ENCOUNTER — Other Ambulatory Visit (HOSPITAL_COMMUNITY): Payer: Self-pay

## 2020-11-20 ENCOUNTER — Emergency Department (HOSPITAL_COMMUNITY): Payer: Medicare Other

## 2020-11-20 ENCOUNTER — Encounter (HOSPITAL_COMMUNITY): Payer: Self-pay | Admitting: Emergency Medicine

## 2020-11-20 DIAGNOSIS — Z21 Asymptomatic human immunodeficiency virus [HIV] infection status: Secondary | ICD-10-CM | POA: Insufficient documentation

## 2020-11-20 DIAGNOSIS — N1831 Chronic kidney disease, stage 3a: Secondary | ICD-10-CM | POA: Insufficient documentation

## 2020-11-20 DIAGNOSIS — R5383 Other fatigue: Secondary | ICD-10-CM | POA: Insufficient documentation

## 2020-11-20 DIAGNOSIS — R002 Palpitations: Secondary | ICD-10-CM | POA: Diagnosis present

## 2020-11-20 DIAGNOSIS — Z87891 Personal history of nicotine dependence: Secondary | ICD-10-CM | POA: Insufficient documentation

## 2020-11-20 DIAGNOSIS — Z79899 Other long term (current) drug therapy: Secondary | ICD-10-CM | POA: Diagnosis not present

## 2020-11-20 DIAGNOSIS — I129 Hypertensive chronic kidney disease with stage 1 through stage 4 chronic kidney disease, or unspecified chronic kidney disease: Secondary | ICD-10-CM | POA: Diagnosis not present

## 2020-11-20 LAB — CBC WITH DIFFERENTIAL/PLATELET
Abs Immature Granulocytes: 0.03 10*3/uL (ref 0.00–0.07)
Basophils Absolute: 0.1 10*3/uL (ref 0.0–0.1)
Basophils Relative: 1 %
Eosinophils Absolute: 0.1 10*3/uL (ref 0.0–0.5)
Eosinophils Relative: 1 %
HCT: 40 % (ref 36.0–46.0)
Hemoglobin: 12.9 g/dL (ref 12.0–15.0)
Immature Granulocytes: 0 %
Lymphocytes Relative: 29 %
Lymphs Abs: 2.9 10*3/uL (ref 0.7–4.0)
MCH: 27.4 pg (ref 26.0–34.0)
MCHC: 32.3 g/dL (ref 30.0–36.0)
MCV: 84.9 fL (ref 80.0–100.0)
Monocytes Absolute: 0.8 10*3/uL (ref 0.1–1.0)
Monocytes Relative: 7 %
Neutro Abs: 6.4 10*3/uL (ref 1.7–7.7)
Neutrophils Relative %: 62 %
Platelets: 233 10*3/uL (ref 150–400)
RBC: 4.71 MIL/uL (ref 3.87–5.11)
RDW: 13 % (ref 11.5–15.5)
WBC: 10.2 10*3/uL (ref 4.0–10.5)
nRBC: 0 % (ref 0.0–0.2)

## 2020-11-20 LAB — COMPREHENSIVE METABOLIC PANEL
ALT: 31 U/L (ref 0–44)
AST: 32 U/L (ref 15–41)
Albumin: 4.3 g/dL (ref 3.5–5.0)
Alkaline Phosphatase: 65 U/L (ref 38–126)
Anion gap: 12 (ref 5–15)
BUN: 11 mg/dL (ref 8–23)
CO2: 23 mmol/L (ref 22–32)
Calcium: 9.1 mg/dL (ref 8.9–10.3)
Chloride: 99 mmol/L (ref 98–111)
Creatinine, Ser: 1.19 mg/dL — ABNORMAL HIGH (ref 0.44–1.00)
GFR, Estimated: 48 mL/min — ABNORMAL LOW (ref >60)
Glucose, Bld: 124 mg/dL — ABNORMAL HIGH (ref 70–99)
Potassium: 3.3 mmol/L — ABNORMAL LOW (ref 3.5–5.1)
Sodium: 134 mmol/L — ABNORMAL LOW (ref 135–145)
Total Bilirubin: 0.6 mg/dL (ref 0.3–1.2)
Total Protein: 7.7 g/dL (ref 6.5–8.1)

## 2020-11-20 LAB — TROPONIN I (HIGH SENSITIVITY)
Troponin I (High Sensitivity): 5 ng/L (ref <18)
Troponin I (High Sensitivity): 5 ng/L (ref <18)

## 2020-11-20 LAB — TSH: TSH: 2.318 u[IU]/mL (ref 0.350–4.500)

## 2020-11-20 MED ORDER — LORAZEPAM 1 MG PO TABS
0.5000 mg | ORAL_TABLET | Freq: Once | ORAL | Status: AC
  Administered 2020-11-20: 0.5 mg via ORAL
  Filled 2020-11-20: qty 1

## 2020-11-20 NOTE — ED Provider Notes (Signed)
Emergency Medicine Provider Triage Evaluation Note  Christine Dixon , a 75 y.o. female  was evaluated in triage.  Pt complains of palpitations for the last few days. Denies chest pain, sob.   Review of Systems  Positive: palpitations Negative: Chest pain, sob  Physical Exam  BP (!) 185/76 (BP Location: Left Arm)   Pulse (!) 124   Temp 98.1 F (36.7 C) (Oral)   Resp 18   SpO2 100%  Gen:   Awake, no distress   Resp:  Normal effort  MSK:   Moves extremities without difficulty  Other:  Regular rhythm, tachycardic  Medical Decision Making  Medically screening exam initiated at 7:25 PM.  Appropriate orders placed.  Christine Dixon was informed that the remainder of the evaluation will be completed by another provider, this initial triage assessment does not replace that evaluation, and the importance of remaining in the ED until their evaluation is complete.    Bishop Dublin 11/20/20 1928    Lacretia Leigh, MD 11/22/20 [phone]

## 2020-11-20 NOTE — ED Provider Notes (Signed)
[facility] EMERGENCY DEPARTMENT Provider Note   CSN: [id number] Arrival date & time: 11/20/20  1907     History Chief Complaint  Patient presents with  . Fatigue  . Palpitations    Christine Dixon is a 75 y.o. female.  The history is provided by the patient and medical records.  Palpitations   75 y.o. F with hx of stage III CKD, crohn's disease, HIV, HTN, sickle cell trait, anxiety, presenting to the ED with fatigue and palpitations.  States this has been ongoing for a while now but seems to be getting worse.  States what bothers her the most is palpitations.  Reports this is random in onset but is happening more frequently.  When this occurs, it will last variable amounts of time but makes her feel like she is dying.  She denies chest pain or SOB with this.  She has no known cardiac history.  She does report having a stress test about 2 years ago that was normal.  She smoked in her teens but none in the past many years.  She reports has been to the doctor a few times about this previously and was told that she had anxiety.  She was started on Lexapro at one time but only took this for about 2 weeks as she did not feel it was helping her and made her feel "odd".  States they tried adding a second medication to this but she never even picked it up from the pharmacy as she was worried how it would make her feel.  Patient does admit to a lot of stress as she is the sole caregiver for her husband who has had a prior stroke, home, errands, etc.  She generally feels anxious about life on a daily basis.  States when these episodes of palpitations occur, she feels like something bad is going to happen to her and then who will be there to take care of her other responsibilities.  She does admit this is a large portion of why she stopped taking the medicine as she needs to be fully awake and alert to tend to her husband.  Past Medical History:  Diagnosis Date  . Chronic kidney  disease, stage 3 ([place])   . Crohn's disease ([place])   . HIV infection ([place])   . Hypertension   . Sickle cell trait [facility])     Patient Active Problem List   Diagnosis Date Noted  . Chronic kidney disease, stage 3a ([place]) 06/04/2020  . GAD (generalized anxiety disorder) 12/29/2018  . Panic disorder 10/05/2018  . Essential hypertension 12/16/2017  . Weakness 10/16/2017  . Atypical chest pain 10/16/2017  . Palpitations 10/16/2017  . Tachycardia 10/16/2017  . Papanicolaou smear of cervix with low grade squamous intraepithelial lesion (LGSIL) 10/08/2012  . 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 Crohn's disease, small intestine ([place]) 03/25/2010  . HIV DISEASE 07/22/2006  . HEPATITIS C 07/22/2006  . ANEMIA OF CHRONIC DISEASE 07/22/2006  . DYSPLASIA, CERVIX, MILD 07/22/2006  . DYSPLASIA, VAGINA 07/22/2006  . OSTEOPOROSIS 07/22/2006  . 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 Anemia of chronic disease 07/22/2006  . HIV disease ([place]) 07/22/2006  . Osteoporosis 07/22/2006    Past Surgical History:  Procedure Laterality Date  . COLON SURGERY       OB History    Gravida  3   Para  3   Term  3   Preterm      AB  Living        SAB      IAB      Ectopic      Multiple      Live Births              Family History  Problem Relation Age of Onset  . Kidney disease Mother   . Sickle cell anemia Father   . Diabetes Maternal Grandmother   . Heart disease Paternal Grandmother     Social History   Tobacco Use  . Smoking status: Former Smoker    Types: Cigarettes    Quit date: 06/29/1968    Years since quitting: 52.4  . Smokeless tobacco: Never Used  Vaping Use  . Vaping Use: Never used  Substance Use Topics  . Alcohol use: No  . Drug use: No    Home Medications Prior to Admission medications   Medication Sig Start Date End Date Taking? Authorizing Provider  amLODipine (NORVASC) 10 MG tablet TAKE 1 TABLET (10 MG TOTAL) BY MOUTH DAILY. 11/20/19 12/17/20   Carlyle Basques, MD  busPIRone (BUSPAR) 5 MG tablet Take 1 tablet (5 mg total) by mouth 2 (two) times daily. Patient not taking: No sig reported 10/21/17   Carlyle Basques, MD  cyclobenzaprine (FLEXERIL) 5 MG tablet  06/04/20   [provider]  Dolutegravir-Rilpivirine 50-25 MG TABS TAKE 1 TABLET BY MOUTH DAILY WITH A MEAL 07/30/20 07/30/21  Carlyle Basques, MD  multivitamin-iron-minerals-folic acid (CENTRUM) chewable tablet Chew 1 tablet by mouth daily. Reported on 09/09/2015    [provider]  vitamin B-12 (CYANOCOBALAMIN) 500 MCG tablet Take by mouth. 06/05/20   [provider]  Vitamin D, Ergocalciferol, (DRISDOL) 1.25 MG (50000 UNIT) CAPS capsule Take 1 capsule by mouth once a week. 06/05/20   [provider]    Allergies    Aspirin and Codeine  Review of Systems   Review of Systems  Cardiovascular: Positive for palpitations.  All other systems reviewed and are negative.   Physical Exam Updated Vital Signs BP 90/66   Pulse 85   Temp 98.1 F (36.7 C) (Oral)   Resp 12   Ht 5' (1.524 m)   Wt 46.7 kg   SpO2 100%   BMI 20.12 kg/m   Physical Exam Vitals and nursing note reviewed.  Constitutional:      Appearance: She is well-developed.  HENT:     Head: Normocephalic and atraumatic.  Eyes:     Conjunctiva/sclera: Conjunctivae normal.     Pupils: Pupils are equal, round, and reactive to light.  Cardiovascular:     Rate and Rhythm: Normal rate and regular rhythm.     Heart sounds: Normal heart sounds.     Comments: HR stable 80's during exam Pulmonary:     Effort: Pulmonary effort is normal.     Breath sounds: Normal breath sounds. No stridor. No wheezing.  Abdominal:     General: Bowel sounds are normal.     Palpations: Abdomen is soft.  Musculoskeletal:        General: Normal range of motion.     Cervical back: Normal range of motion.  Skin:    General: Skin is warm and dry.  Neurological:     Mental Status: She is alert and oriented  to person, place, and time.  Psychiatric:     Comments: Intermittently tearful when speaking of husband and life stressors     ED Results / Procedures / Treatments   Labs (all labs ordered  are listed, but only abnormal results are displayed) Labs Reviewed  COMPREHENSIVE METABOLIC PANEL - Abnormal; Notable for the following components:      Result Value   Sodium 134 (*)    Potassium 3.3 (*)    Glucose, Bld 124 (*)    Creatinine, Ser 1.19 (*)    GFR, Estimated 48 (*)    All other components within normal limits  CBC WITH DIFFERENTIAL/PLATELET  TSH  URINALYSIS, ROUTINE W REFLEX MICROSCOPIC  TROPONIN I (HIGH SENSITIVITY)  TROPONIN I (HIGH SENSITIVITY)    EKG None  Radiology DG Chest 2 View  Result Date: 11/20/2020 CLINICAL DATA:  Palpitations, fatigue, anxiety, history of hypertension and smoking EXAM: CHEST - 2 VIEW COMPARISON:  Radiograph 09/14/2017 FINDINGS: Mildly coarsened interstitial and bronchitic changes are similar to comparison exams and may reflect chronic smoking related lung changes. No acute consolidative opacity or convincing features of pulmonary edema. No pneumothorax or visible effusion. The aorta is calcified. The remaining cardiomediastinal contours are unremarkable. No acute osseous or soft tissue abnormality. IMPRESSION: No acute cardiopulmonary abnormality. Electronically Signed   By: Lovena Le M.D.   On: 11/20/2020 19:54    Procedures Procedures   Medications Ordered in ED Medications  LORazepam (ATIVAN) tablet 0.5 mg (has no administration in time range)    ED Course  I have reviewed the triage vital signs and the nursing notes.  Pertinent labs & imaging results that were available during my care of the patient were reviewed by me and considered in my medical decision making (see chart for details).    MDM Rules/Calculators/A&P  76 year old female presenting to the ED with episodes of palpitations and fatigue.  This has been ongoing for a  while now but becoming more frequent.  States she has been diagnosed with anxiety in the past but is not entirely sure that is what it is.  She does admit to feeling anxious and has a lot of stress being primary caregiver for her husband.  On arrival to ED she was initially tachycardic around 120s but this has normalized by time of my evaluation without acute intervention.  While talking with patient, she does get a bit tearful when speaking about her husband and her responsibilities in the home.  She does admit to daily anxiety affiliated with this.  Her EKG is nonischemic.  Labs are grossly reassuring, renal function appears around her baseline.  Initial troponin is negative.  Chest x-ray is clear.  TSH also sent and is normal.  Given her age, will obtain delta troponin.  She does not like the idea of being forced to take anxiety medication on a daily basis, would rather take just "as needed".  Given small dose of ativan here to see how it works for her.  1:29 AM Patient reports she feels better after the ativan.  No further palpitations, actually feels quite calm but no dizziness/drowsiness.  Delta trop is negative.  Feel she is stable for discharge home.  Patient is much more open to the idea of taking as needed medication.  As she did well with the very small dose of Ativan, I do feel it is reasonable to trial this at home over the next few days.  She actually has a follow-up appointment with her primary care doctor on Friday (2 days from now) and will discuss with her further.  She may return here for any new/acute changes.  Final Clinical Impression(s) / ED Diagnoses Final diagnoses:  Palpitations    Rx /  DC Orders ED Discharge Orders         Ordered    LORazepam (ATIVAN) 1 MG tablet  3 times daily PRN        11/21/20 0132           Larene Pickett, PA-C 11/21/20 0138    Lacretia Leigh, MD 11/22/20 [phone]

## 2020-11-20 NOTE — ED Triage Notes (Signed)
Pt reports feeling poorly for the past couple of days, including fatigue and palpitations.  Hx of anxiety but it feels different.

## 2020-11-21 MED ORDER — LORAZEPAM 1 MG PO TABS: 0.5000 mg | ORAL_TABLET | Freq: Three times a day (TID) | ORAL | 0 refills | Status: DC | PRN

## 2020-11-21 NOTE — ED Notes (Signed)
Patient verbalized understanding of discharge instructions. Opportunity for questions and answers.  

## 2020-11-21 NOTE — Discharge Instructions (Signed)
Your work-up today was normal including cardiac tests and thyroid screening. You did respond well to the medication we gave you here, I have given you prescription for same.  This only needs to be taken as needed. Follow-up with your doctor on Friday as scheduled. Return here for new concerns.

## 2020-12-23 ENCOUNTER — Other Ambulatory Visit (HOSPITAL_COMMUNITY): Payer: Self-pay

## 2020-12-23 ENCOUNTER — Other Ambulatory Visit: Payer: Self-pay | Admitting: Internal Medicine

## 2020-12-23 MED ORDER — AMLODIPINE BESYLATE 10 MG PO TABS
ORAL_TABLET | Freq: Every day | ORAL | 0 refills | Status: AC
  Filled 2020-12-23: qty 30, 30d supply, fill #0

## 2020-12-24 ENCOUNTER — Other Ambulatory Visit (HOSPITAL_COMMUNITY): Payer: Self-pay

## 2021-01-17 ENCOUNTER — Other Ambulatory Visit (HOSPITAL_COMMUNITY): Payer: Self-pay

## 2021-01-23 ENCOUNTER — Other Ambulatory Visit: Payer: Self-pay | Admitting: Physician Assistant

## 2021-01-23 ENCOUNTER — Other Ambulatory Visit: Payer: Self-pay | Admitting: Internal Medicine

## 2021-01-23 ENCOUNTER — Other Ambulatory Visit (HOSPITAL_COMMUNITY): Payer: Self-pay

## 2021-01-24 ENCOUNTER — Other Ambulatory Visit (HOSPITAL_COMMUNITY): Payer: Self-pay

## 2021-01-27 ENCOUNTER — Other Ambulatory Visit (HOSPITAL_COMMUNITY): Payer: Self-pay

## 2021-01-27 ENCOUNTER — Other Ambulatory Visit: Payer: Self-pay | Admitting: Physician Assistant

## 2021-01-29 ENCOUNTER — Other Ambulatory Visit: Payer: Self-pay | Admitting: Physician Assistant

## 2021-01-29 ENCOUNTER — Other Ambulatory Visit (HOSPITAL_COMMUNITY): Payer: Self-pay

## 2021-01-30 ENCOUNTER — Other Ambulatory Visit (HOSPITAL_COMMUNITY): Payer: Self-pay

## 2021-01-31 ENCOUNTER — Other Ambulatory Visit (HOSPITAL_COMMUNITY): Payer: Self-pay

## 2021-02-03 ENCOUNTER — Other Ambulatory Visit (HOSPITAL_COMMUNITY): Payer: Self-pay

## 2021-02-03 ENCOUNTER — Other Ambulatory Visit: Payer: Self-pay | Admitting: Physician Assistant

## 2021-02-04 ENCOUNTER — Other Ambulatory Visit (HOSPITAL_COMMUNITY): Payer: Self-pay

## 2021-02-14 ENCOUNTER — Other Ambulatory Visit: Payer: Self-pay | Admitting: Internal Medicine

## 2021-02-14 ENCOUNTER — Other Ambulatory Visit (HOSPITAL_COMMUNITY): Payer: Self-pay

## 2021-02-17 ENCOUNTER — Other Ambulatory Visit (HOSPITAL_COMMUNITY): Payer: Self-pay

## 2021-02-17 MED ORDER — JULUCA 50-25 MG PO TABS
ORAL_TABLET | ORAL | 1 refills | Status: DC
  Filled 2021-02-17: qty 90, 90d supply, fill #0
  Filled 2021-05-29: qty 90, 90d supply, fill #1

## 2021-02-18 ENCOUNTER — Other Ambulatory Visit (HOSPITAL_COMMUNITY): Payer: Self-pay

## 2021-04-30 ENCOUNTER — Other Ambulatory Visit: Payer: Self-pay | Admitting: Physician Assistant

## 2021-04-30 DIAGNOSIS — M810 Age-related osteoporosis without current pathological fracture: Secondary | ICD-10-CM

## 2021-04-30 DIAGNOSIS — Z1231 Encounter for screening mammogram for malignant neoplasm of breast: Secondary | ICD-10-CM

## 2021-05-07 ENCOUNTER — Other Ambulatory Visit (HOSPITAL_COMMUNITY): Payer: Self-pay

## 2021-05-07 ENCOUNTER — Other Ambulatory Visit: Payer: Self-pay

## 2021-05-07 DIAGNOSIS — Z79899 Other long term (current) drug therapy: Secondary | ICD-10-CM

## 2021-05-07 DIAGNOSIS — B20 Human immunodeficiency virus [HIV] disease: Secondary | ICD-10-CM

## 2021-05-12 ENCOUNTER — Other Ambulatory Visit: Payer: Self-pay

## 2021-05-12 ENCOUNTER — Other Ambulatory Visit: Payer: Medicare Other

## 2021-05-12 DIAGNOSIS — B20 Human immunodeficiency virus [HIV] disease: Secondary | ICD-10-CM

## 2021-05-12 DIAGNOSIS — Z79899 Other long term (current) drug therapy: Secondary | ICD-10-CM

## 2021-05-13 LAB — T-HELPER CELL (CD4) - (RCID CLINIC ONLY)
CD4 % Helper T Cell: 31 % — ABNORMAL LOW (ref 33–65)
CD4 T Cell Abs: 1000 /uL (ref 400–1790)

## 2021-05-14 LAB — COMPREHENSIVE METABOLIC PANEL
AG Ratio: 1.4 (calc) (ref 1.0–2.5)
ALT: 23 U/L (ref 6–29)
AST: 26 U/L (ref 10–35)
Albumin: 4.5 g/dL (ref 3.6–5.1)
Alkaline phosphatase (APISO): 65 U/L (ref 37–153)
BUN/Creatinine Ratio: 9 (calc) (ref 6–22)
BUN: 12 mg/dL (ref 7–25)
CO2: 28 mmol/L (ref 20–32)
Calcium: 10 mg/dL (ref 8.6–10.4)
Chloride: 101 mmol/L (ref 98–110)
Creat: 1.34 mg/dL — ABNORMAL HIGH (ref 0.60–1.00)
Globulin: 3.3 g/dL (calc) (ref 1.9–3.7)
Glucose, Bld: 104 mg/dL — ABNORMAL HIGH (ref 65–99)
Potassium: 4.2 mmol/L (ref 3.5–5.3)
Sodium: 139 mmol/L (ref 135–146)
Total Bilirubin: 0.4 mg/dL (ref 0.2–1.2)
Total Protein: 7.8 g/dL (ref 6.1–8.1)

## 2021-05-14 LAB — LIPID PANEL
Cholesterol: 164 mg/dL (ref <200)
HDL: 52 mg/dL (ref >=50)
Non-HDL Cholesterol (Calc): 112 mg/dL (calc) (ref <130)
Total CHOL/HDL Ratio: 3.2 (calc) (ref <5.0)
Triglycerides: 530 mg/dL — ABNORMAL HIGH (ref <150)

## 2021-05-14 LAB — CBC WITH DIFFERENTIAL/PLATELET
Absolute Monocytes: 1384 cells/uL — ABNORMAL HIGH (ref 200–950)
Basophils Absolute: 64 cells/uL (ref 0–200)
Basophils Relative: 0.5 %
Eosinophils Absolute: 140 cells/uL (ref 15–500)
Eosinophils Relative: 1.1 %
HCT: 40.5 % (ref 35.0–45.0)
Hemoglobin: 13.2 g/dL (ref 11.7–15.5)
Lymphs Abs: 3137 cells/uL (ref 850–3900)
MCH: 26.5 pg — ABNORMAL LOW (ref 27.0–33.0)
MCHC: 32.6 g/dL (ref 32.0–36.0)
MCV: 81.3 fL (ref 80.0–100.0)
MPV: 11.5 fL (ref 7.5–12.5)
Monocytes Relative: 10.9 %
Neutro Abs: 7976 cells/uL — ABNORMAL HIGH (ref 1500–7800)
Neutrophils Relative %: 62.8 %
Platelets: 289 10*3/uL (ref 140–400)
RBC: 4.98 10*6/uL (ref 3.80–5.10)
RDW: 13.3 % (ref 11.0–15.0)
Total Lymphocyte: 24.7 %
WBC: 12.7 10*3/uL — ABNORMAL HIGH (ref 3.8–10.8)

## 2021-05-14 LAB — HIV-1 RNA QUANT-NO REFLEX-BLD
HIV 1 RNA Quant: <20 Copies/mL — ABNORMAL HIGH
HIV-1 RNA Quant, Log: <1.3 Log cps/mL — ABNORMAL HIGH

## 2021-05-29 ENCOUNTER — Other Ambulatory Visit (HOSPITAL_COMMUNITY): Payer: Self-pay

## 2021-05-29 ENCOUNTER — Other Ambulatory Visit: Payer: Self-pay | Admitting: Pharmacist

## 2021-05-29 MED ORDER — JULUCA 50-25 MG PO TABS: ORAL_TABLET | ORAL | 1 refills | Status: DC

## 2021-06-03 ENCOUNTER — Ambulatory Visit (INDEPENDENT_AMBULATORY_CARE_PROVIDER_SITE_OTHER): Payer: Medicare Other | Admitting: Internal Medicine

## 2021-06-03 ENCOUNTER — Encounter: Payer: Self-pay | Admitting: Internal Medicine

## 2021-06-03 ENCOUNTER — Other Ambulatory Visit: Payer: Self-pay

## 2021-06-03 VITALS — BP 160/79 | HR 93 | Temp 98.6°F | Wt 98.8 lb

## 2021-06-03 DIAGNOSIS — E782 Mixed hyperlipidemia: Secondary | ICD-10-CM

## 2021-06-03 DIAGNOSIS — N183 Chronic kidney disease, stage 3 unspecified: Secondary | ICD-10-CM

## 2021-06-03 DIAGNOSIS — I10 Essential (primary) hypertension: Secondary | ICD-10-CM

## 2021-06-03 DIAGNOSIS — B20 Human immunodeficiency virus [HIV] disease: Secondary | ICD-10-CM

## 2021-06-03 NOTE — Progress Notes (Signed)
Patient ID: Christine Dixon, female   DOB: 12-Apr-1946, 75 y.o.   MRN: [id number]  HPI Christine Dixon is a pleasant 75yo F with well controlled hiv disease, she has been on [place] since sept 2018. Has had flu and covid booster. She is doing well. Feels like her anxiety is under fairly good control. We spent time to Discussed elevated TG on her lipid profile. She is wondering if it is due to her medications. And what can she take to help her labs Outpatient Encounter Medications as of 06/03/2021  Medication Sig   amLODipine (NORVASC) 10 MG tablet Take one tablet by mouth daily.  **please contact md for more refills**   dolutegravir-rilpivirine (JULUCA) 50-25 MG tablet TAKE 1 TABLET BY MOUTH DAILY WITH A MEAL   multivitamin-iron-minerals-folic acid (CENTRUM) chewable tablet Chew 1 tablet by mouth daily. Reported on 09/09/2015   vitamin B-12 (CYANOCOBALAMIN) 500 MCG tablet Take 500 mcg by mouth daily.   Vitamin D, Ergocalciferol, (DRISDOL) 1.25 MG (50000 UNIT) CAPS capsule Take 50,000 Units by mouth once a week.   busPIRone (BUSPAR) 5 MG tablet Take 1 tablet (5 mg total) by mouth 2 (two) times daily. (Patient not taking: Reported on 11/21/2020)   cyclobenzaprine (FLEXERIL) 5 MG tablet Take 5 mg by mouth in the morning and at bedtime. (Patient not taking: Reported on 11/21/2020)   LORazepam (ATIVAN) 1 MG tablet Take 0.5 tablets (0.5 mg total) by mouth 3 (three) times daily as needed for anxiety. (Patient not taking: Reported on 06/03/2021)   No facility-administered encounter medications on file as of 06/03/2021.     Patient Active Problem List   Diagnosis Date Noted   Chronic kidney disease, stage 3a ([place]) 06/04/2020   GAD (generalized anxiety disorder) 12/29/2018   Panic disorder 10/05/2018   Essential hypertension 12/16/2017   Weakness 10/16/2017   Atypical chest pain 10/16/2017   Palpitations 10/16/2017   Tachycardia 10/16/2017   Papanicolaou smear of cervix with low grade squamous  intraepithelial lesion (LGSIL) 10/08/2012   CROHN'S DISEASE, SMALL INTESTINE 03/25/2010   Crohn's disease, small intestine ([place]) 03/25/2010   HIV DISEASE 07/22/2006   HEPATITIS C 07/22/2006   ANEMIA OF CHRONIC DISEASE 07/22/2006   DYSPLASIA, CERVIX, MILD 07/22/2006   DYSPLASIA, VAGINA 07/22/2006   OSTEOPOROSIS 07/22/2006   Dysplasia, 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  HENT: Negative for congestion, sore throat, rhinorrhea, sneezing, trouble swallowing and sinus pressure.  Eyes: Negative for photophobia and visual disturbance.  Respiratory: Negative for cough, chest tightness, shortness of breath, wheezing and stridor.  Cardiovascular: Negative for chest pain, palpitations and leg swelling.  Gastrointestinal: Negative for nausea, vomiting, abdominal pain, diarrhea, constipation, blood in stool, abdominal distention and anal bleeding.  Genitourinary: Negative for dysuria, hematuria, flank pain and difficulty urinating.  Musculoskeletal: Negative for myalgias, back pain, joint swelling, arthralgias and gait problem.  Skin: Negative for color change, pallor, rash and wound.  Neurological: Negative for dizziness, tremors, weakness and light-headedness.  Hematological: Negative for adenopathy. Does not bruise/bleed easily.  Psychiatric/Behavioral: Negative for behavioral problems, confusion, sleep disturbance, dysphoric mood, decreased concentration and  agitation.   Physical Exam   BP (!) 160/79   Pulse 93   Temp 98.6 F (37 C) (Temporal)   Wt 98 lb 12.8 oz (44.8 kg)   SpO2 99%   BMI  19.30 kg/m   Physical Exam  Constitutional:  oriented to person, place, and time. appears well-developed and well-nourished. No distress.  HENT: [place]/AT, PERRLA, no scleral icterus Mouth/Throat: Oropharynx is clear and moist. No oropharyngeal exudate.  Cardiovascular: Normal rate, regular rhythm and normal heart sounds. Exam reveals no gallop and no friction rub.  No murmur heard.  Pulmonary/Chest: Effort normal and breath sounds normal. No respiratory distress.  has no wheezes.  Neck = supple, no nuchal rigidity Abdominal: Soft. Bowel sounds are normal.  exhibits no distension. There is no tenderness.  Lymphadenopathy: no cervical adenopathy. No axillary adenopathy Neurological: alert and oriented to person, place, and time.  Skin: Skin is warm and dry. No rash noted. No erythema.  Psychiatric: a normal mood and affect.  behavior is normal.   Lab Results  Component Value Date   CD4TCELL 31 (L) 05/12/2021   Lab Results  Component Value Date   CD4TABS 1,000 05/12/2021   CD4TABS 710 10/02/2020   CD4TABS 890 04/05/2020   Lab Results  Component Value Date   HIV1RNAQUANT <20 (H) 05/12/2021   Lab Results  Component Value Date   HEPBSAB POS (A) 06/17/2015   Lab Results  Component Value Date   LABRPR NON-REACTIVE 10/02/2020    CBC Lab Results  Component Value Date   WBC 12.7 (H) 05/12/2021   RBC 4.98 05/12/2021   HGB 13.2 05/12/2021   HCT 40.5 05/12/2021   PLT 289 05/12/2021   MCV 81.3 05/12/2021   MCH 26.5 (L) 05/12/2021   MCHC 32.6 05/12/2021   RDW 13.3 05/12/2021   LYMPHSABS 3,137 05/12/2021   MONOABS 0.8 11/20/2020   EOSABS 140 05/12/2021    BMET Lab Results  Component Value Date   NA 139 05/12/2021   K 4.2 05/12/2021   CL 101 05/12/2021   CO2 28 05/12/2021   GLUCOSE 104 (H) 05/12/2021   BUN 12 05/12/2021   CREATININE 1.34 (H) 05/12/2021   CALCIUM 10.0 05/12/2021   GFRNONAA 48 (L) 11/20/2020   GFRAA 44 (L) 10/02/2020      Assessment and Plan HIV  disease= well controlled. Continue on juluca  hyperTG = patient would like to try fish oil supplementation to decrease TG. Recheck cholesterol in 4-6 months  Long term medication management = cr is stable at ckd 3  Hypertension = above goal--but she does suffer from white coat syndrome  Rtc in 4 months

## 2021-06-24 ENCOUNTER — Other Ambulatory Visit (HOSPITAL_COMMUNITY): Payer: Self-pay

## 2021-07-02 ENCOUNTER — Ambulatory Visit: Payer: Medicare Other

## 2021-10-06 ENCOUNTER — Other Ambulatory Visit: Payer: Self-pay

## 2021-10-06 DIAGNOSIS — B20 Human immunodeficiency virus [HIV] disease: Secondary | ICD-10-CM

## 2021-10-07 ENCOUNTER — Other Ambulatory Visit: Payer: Medicare Other

## 2021-10-21 ENCOUNTER — Encounter: Payer: Medicare Other | Admitting: Internal Medicine

## 2021-12-22 ENCOUNTER — Other Ambulatory Visit: Payer: Self-pay | Admitting: Family Medicine

## 2021-12-29 ENCOUNTER — Other Ambulatory Visit: Payer: Self-pay | Admitting: Internal Medicine

## 2021-12-29 DIAGNOSIS — B20 Human immunodeficiency virus [HIV] disease: Secondary | ICD-10-CM

## 2022-01-20 ENCOUNTER — Ambulatory Visit: Payer: Medicare Other | Admitting: Internal Medicine

## 2022-01-27 ENCOUNTER — Other Ambulatory Visit: Payer: Medicare Other

## 2022-01-27 ENCOUNTER — Other Ambulatory Visit: Payer: Self-pay

## 2022-01-27 DIAGNOSIS — N183 Chronic kidney disease, stage 3 unspecified: Secondary | ICD-10-CM

## 2022-01-27 DIAGNOSIS — B20 Human immunodeficiency virus [HIV] disease: Secondary | ICD-10-CM

## 2022-01-28 LAB — T-HELPER CELL (CD4) - (RCID CLINIC ONLY)
CD4 % Helper T Cell: 29 % — ABNORMAL LOW (ref 33–65)
CD4 T Cell Abs: 831 /uL (ref 400–1790)

## 2022-02-03 LAB — CBC WITH DIFFERENTIAL/PLATELET
Absolute Monocytes: 1334 cells/uL — ABNORMAL HIGH (ref 200–950)
Basophils Absolute: 56 cells/uL (ref 0–200)
Basophils Relative: 0.4 %
Eosinophils Absolute: 139 cells/uL (ref 15–500)
Eosinophils Relative: 1 %
HCT: 37.4 % (ref 35.0–45.0)
Hemoglobin: 12.2 g/dL (ref 11.7–15.5)
Lymphs Abs: 3030 cells/uL (ref 850–3900)
MCH: 27.2 pg (ref 27.0–33.0)
MCHC: 32.6 g/dL (ref 32.0–36.0)
MCV: 83.3 fL (ref 80.0–100.0)
MPV: 10.6 fL (ref 7.5–12.5)
Monocytes Relative: 9.6 %
Neutro Abs: 9341 cells/uL — ABNORMAL HIGH (ref 1500–7800)
Neutrophils Relative %: 67.2 %
Platelets: 383 10*3/uL (ref 140–400)
RBC: 4.49 10*6/uL (ref 3.80–5.10)
RDW: 13 % (ref 11.0–15.0)
Total Lymphocyte: 21.8 %
WBC: 13.9 10*3/uL — ABNORMAL HIGH (ref 3.8–10.8)

## 2022-02-03 LAB — COMPLETE METABOLIC PANEL WITH GFR
AG Ratio: 1.4 (calc) (ref 1.0–2.5)
ALT: 13 U/L (ref 6–29)
AST: 21 U/L (ref 10–35)
Albumin: 4.4 g/dL (ref 3.6–5.1)
Alkaline phosphatase (APISO): 45 U/L (ref 37–153)
BUN/Creatinine Ratio: 10 (calc) (ref 6–22)
BUN: 14 mg/dL (ref 7–25)
CO2: 25 mmol/L (ref 20–32)
Calcium: 9.6 mg/dL (ref 8.6–10.4)
Chloride: 103 mmol/L (ref 98–110)
Creat: 1.35 mg/dL — ABNORMAL HIGH (ref 0.60–1.00)
Globulin: 3.2 g/dL (calc) (ref 1.9–3.7)
Glucose, Bld: 93 mg/dL (ref 65–99)
Potassium: 4.4 mmol/L (ref 3.5–5.3)
Sodium: 137 mmol/L (ref 135–146)
Total Bilirubin: 0.2 mg/dL (ref 0.2–1.2)
Total Protein: 7.6 g/dL (ref 6.1–8.1)
eGFR: 41 mL/min/{1.73_m2} — ABNORMAL LOW (ref >=60)

## 2022-02-03 LAB — LIPID PANEL
Cholesterol: 158 mg/dL (ref <200)
HDL: 35 mg/dL — ABNORMAL LOW (ref >=50)
Non-HDL Cholesterol (Calc): 123 mg/dL (calc) (ref <130)
Total CHOL/HDL Ratio: 4.5 (calc) (ref <5.0)
Triglycerides: 553 mg/dL — ABNORMAL HIGH (ref <150)

## 2022-02-03 LAB — HIV-1 RNA QUANT-NO REFLEX-BLD
HIV 1 RNA Quant: 39 Copies/mL — ABNORMAL HIGH
HIV-1 RNA Quant, Log: 1.59 Log cps/mL — ABNORMAL HIGH

## 2022-02-16 ENCOUNTER — Encounter: Payer: Self-pay | Admitting: Internal Medicine

## 2022-02-16 ENCOUNTER — Ambulatory Visit (INDEPENDENT_AMBULATORY_CARE_PROVIDER_SITE_OTHER): Payer: Medicare Other | Admitting: Internal Medicine

## 2022-02-16 ENCOUNTER — Other Ambulatory Visit: Payer: Self-pay

## 2022-02-16 VITALS — BP 156/69 | HR 90 | Temp 98.1°F | Ht 60.0 in | Wt 97.0 lb

## 2022-02-16 DIAGNOSIS — B20 Human immunodeficiency virus [HIV] disease: Secondary | ICD-10-CM | POA: Diagnosis not present

## 2022-02-16 DIAGNOSIS — N183 Chronic kidney disease, stage 3 unspecified: Secondary | ICD-10-CM

## 2022-02-16 DIAGNOSIS — M818 Other osteoporosis without current pathological fracture: Secondary | ICD-10-CM | POA: Diagnosis not present

## 2022-02-16 MED ORDER — JULUCA 50-25 MG PO TABS: ORAL_TABLET | ORAL | 3 refills | Status: DC

## 2022-02-16 NOTE — Progress Notes (Signed)
Patient ID: Christine Dixon, female   DOB: Jun 07, 1946, 76 y.o.   MRN: [id number]  HPI Christine Dixon is a 76yo F with well controlled hiv disease, and anxiety. She has been well controlled on juluca this summer she Went on [place] cruise 7 days. No issues with fosamax. Takes juluca daily. CD 4 count of 831/VL 39  Walks up and down steps at her house for exercise  Outpatient Encounter Medications as of 02/16/2022  Medication Sig   alendronate (FOSAMAX) 70 MG tablet Take 70 mg by mouth once a week.   amLODipine (NORVASC) 10 MG tablet Take one tablet by mouth daily.  **please contact md for more refills**   gemfibrozil (LOPID) 600 MG tablet Take 600 mg by mouth 2 (two) times daily.   JULUCA 50-25 MG tablet TAKE 1 TABLET BY MOUTH DAILY WITH A MEAL   multivitamin-iron-minerals-folic acid (CENTRUM) chewable tablet Chew 1 tablet by mouth daily. Reported on 09/09/2015   vitamin B-12 (CYANOCOBALAMIN) 500 MCG tablet Take 500 mcg by mouth daily.   Vitamin D, Ergocalciferol, (DRISDOL) 1.25 MG (50000 UNIT) CAPS capsule Take 50,000 Units by mouth once a week.   No facility-administered encounter medications on file as of 02/16/2022.     Patient Active Problem List   Diagnosis Date Noted   Chronic kidney disease, stage 3a ([place]) 06/04/2020   GAD (generalized anxiety disorder) 12/29/2018   Panic disorder 10/05/2018   Essential hypertension 12/16/2017   Weakness 10/16/2017   Atypical chest pain 10/16/2017   Palpitations 10/16/2017   Tachycardia 10/16/2017   Papanicolaou smear of cervix with low grade squamous intraepithelial lesion (LGSIL) 10/08/2012   CROHN'S DISEASE, SMALL INTESTINE 03/25/2010   Crohn's disease, small intestine ([place]) 03/25/2010   HIV DISEASE 07/22/2006   HEPATITIS C 07/22/2006   ANEMIA OF CHRONIC DISEASE 07/22/2006   DYSPLASIA, CERVIX, MILD 07/22/2006   DYSPLASIA, VAGINA 07/22/2006   OSTEOPOROSIS 07/22/2006   Dysplasia, 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  HENT: Negative for congestion, sore throat, rhinorrhea, sneezing, trouble swallowing and sinus pressure.  Eyes: Negative for photophobia and visual disturbance.  Respiratory: Negative for cough, chest tightness, shortness of breath, wheezing and stridor.  Cardiovascular: Negative for chest pain, palpitations and leg swelling.  Gastrointestinal: Negative for nausea, vomiting, abdominal pain, diarrhea, constipation, blood in stool, abdominal distention and anal bleeding.  Genitourinary: Negative for dysuria, hematuria, flank pain and difficulty urinating.  Musculoskeletal: Negative for myalgias, back pain, joint swelling, arthralgias and gait problem.  Skin: Negative for color change, pallor, rash and wound.  Neurological: Negative for dizziness, tremors, weakness and light-headedness.  Hematological: Negative for adenopathy. Does not bruise/bleed easily.  Psychiatric/Behavioral: Negative for behavioral problems, confusion, sleep disturbance, dysphoric mood, decreased concentration and agitation.   Physical Exam   BP (!) 156/69   Pulse 90   Temp 98.1 F (36.7 C) (Oral)   Ht 5' (1.524 m)   Wt 97 lb (44 kg)   BMI 18.94 kg/m   Physical Exam  Constitutional:  oriented to person, place, and time.  appears well-developed and well-nourished. No distress.  HENT: [place]/AT, PERRLA, no scleral icterus Mouth/Throat: Oropharynx is clear and moist. No oropharyngeal exudate.  Cardiovascular: Normal rate, regular rhythm and normal heart sounds. Exam reveals no gallop and  no friction rub.  No murmur heard.  Pulmonary/Chest: Effort normal and breath sounds normal. No respiratory distress.  has no wheezes.  Neck = supple, no nuchal rigidity Abdominal: Soft. Bowel sounds are normal.  exhibits no distension. There is no tenderness.  Lymphadenopathy: no cervical adenopathy. No axillary adenopathy Neurological: alert and oriented to person, place, and time.  Skin: Skin is warm and dry. No rash noted. No erythema.  Psychiatric: a normal mood and affect.  behavior is normal.   Lab Results  Component Value Date   CD4TCELL 29 (L) 01/27/2022   Lab Results  Component Value Date   CD4TABS 831 01/27/2022   CD4TABS 1,000 05/12/2021   CD4TABS 710 10/02/2020   Lab Results  Component Value Date   HIV1RNAQUANT 39 (H) 01/27/2022   Lab Results  Component Value Date   HEPBSAB POS (A) 06/17/2015   Lab Results  Component Value Date   LABRPR NON-REACTIVE 10/02/2020    CBC Lab Results  Component Value Date   WBC 13.9 (H) 01/27/2022   RBC 4.49 01/27/2022   HGB 12.2 01/27/2022   HCT 37.4 01/27/2022   PLT 383 01/27/2022   MCV 83.3 01/27/2022   MCH 27.2 01/27/2022   MCHC 32.6 01/27/2022   RDW 13.0 01/27/2022   LYMPHSABS 3,030 01/27/2022   MONOABS 0.8 11/20/2020   EOSABS 139 01/27/2022    BMET Lab Results  Component Value Date   NA 137 01/27/2022   K 4.4 01/27/2022   CL 103 01/27/2022   CO2 25 01/27/2022   GLUCOSE 93 01/27/2022   BUN 14 01/27/2022   CREATININE 1.35 (H) 01/27/2022   CALCIUM 9.6 01/27/2022   GFRNONAA 48 (L) 11/20/2020   GFRAA 44 (L) 10/02/2020      Assessment and Plan  Hiv disease= well controlled, continue with juluca. Will give refills.Can we do express scripts for mailing of meds  Osteoporesis= More exercise for weight bearing exercise  Ckd-3= stable  Health maintenance = flu and covid vaccine

## 2022-03-29 ENCOUNTER — Other Ambulatory Visit: Payer: Self-pay | Admitting: Internal Medicine

## 2022-03-29 DIAGNOSIS — B20 Human immunodeficiency virus [HIV] disease: Secondary | ICD-10-CM

## 2022-04-04 ENCOUNTER — Other Ambulatory Visit: Payer: Self-pay | Admitting: Internal Medicine

## 2022-04-04 DIAGNOSIS — B20 Human immunodeficiency virus [HIV] disease: Secondary | ICD-10-CM

## 2022-04-12 IMAGING — MG DIGITAL SCREENING BILAT W/ CAD
4 series · 4 of 4 positions shown · non-contrast
Comparison: Previous exam(s).

CLINICAL DATA: Screening.

EXAM:
DIGITAL SCREENING BILATERAL MAMMOGRAM WITH CAD

[L CC]
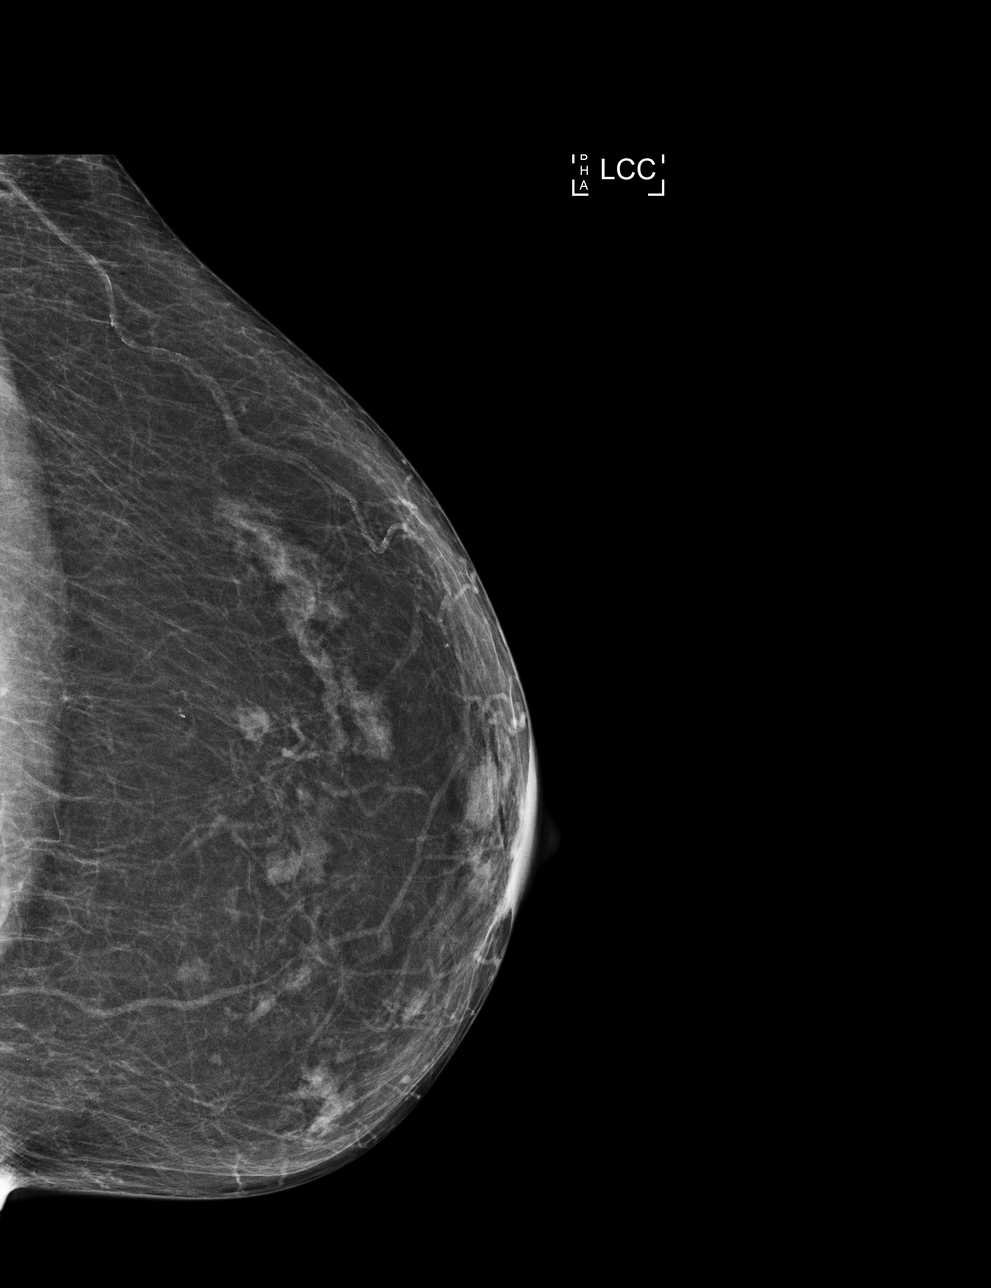

[R MLO]
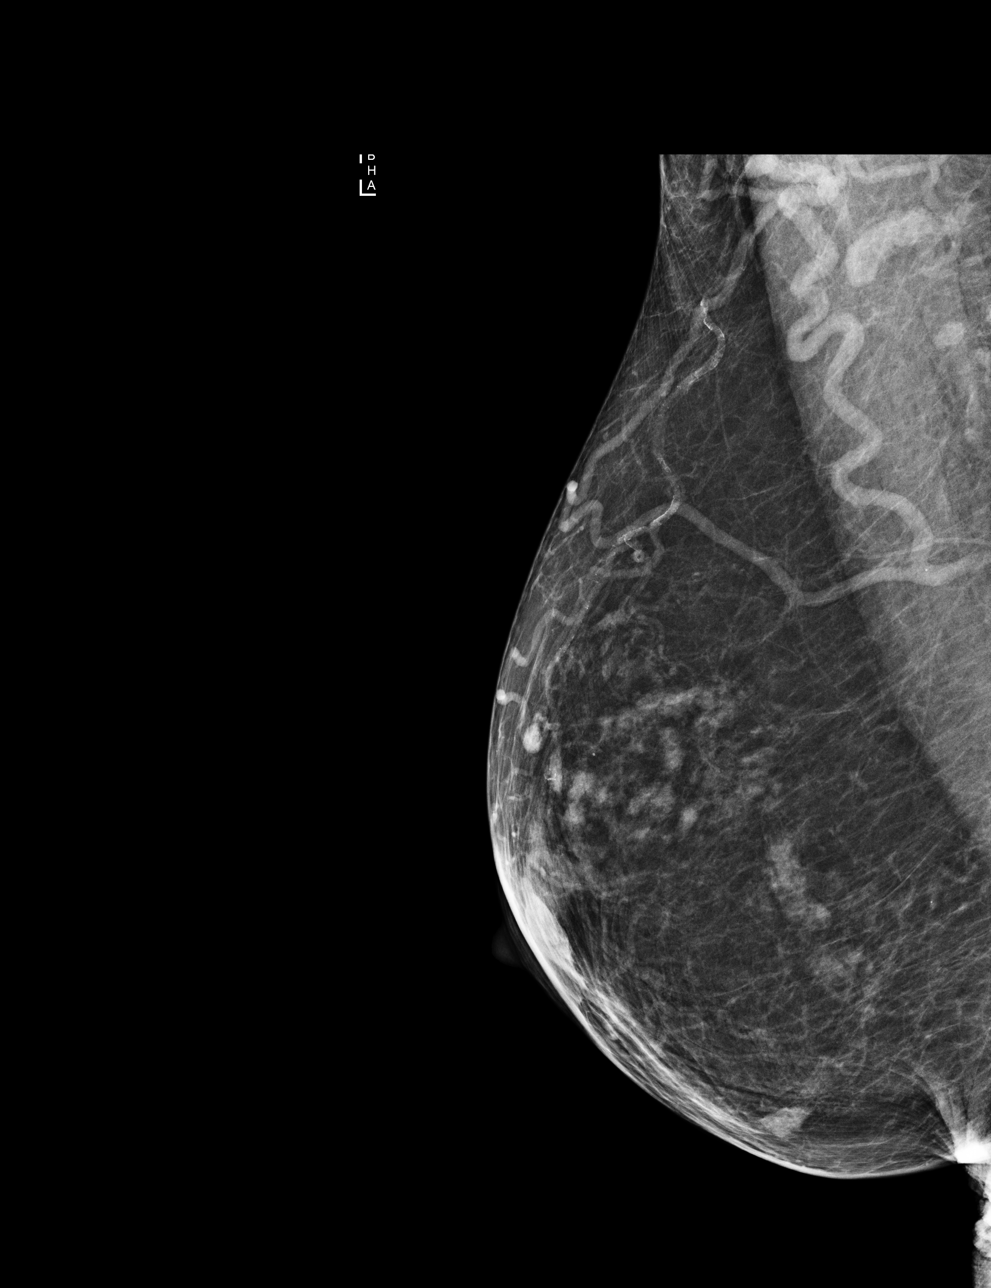

[R CC]
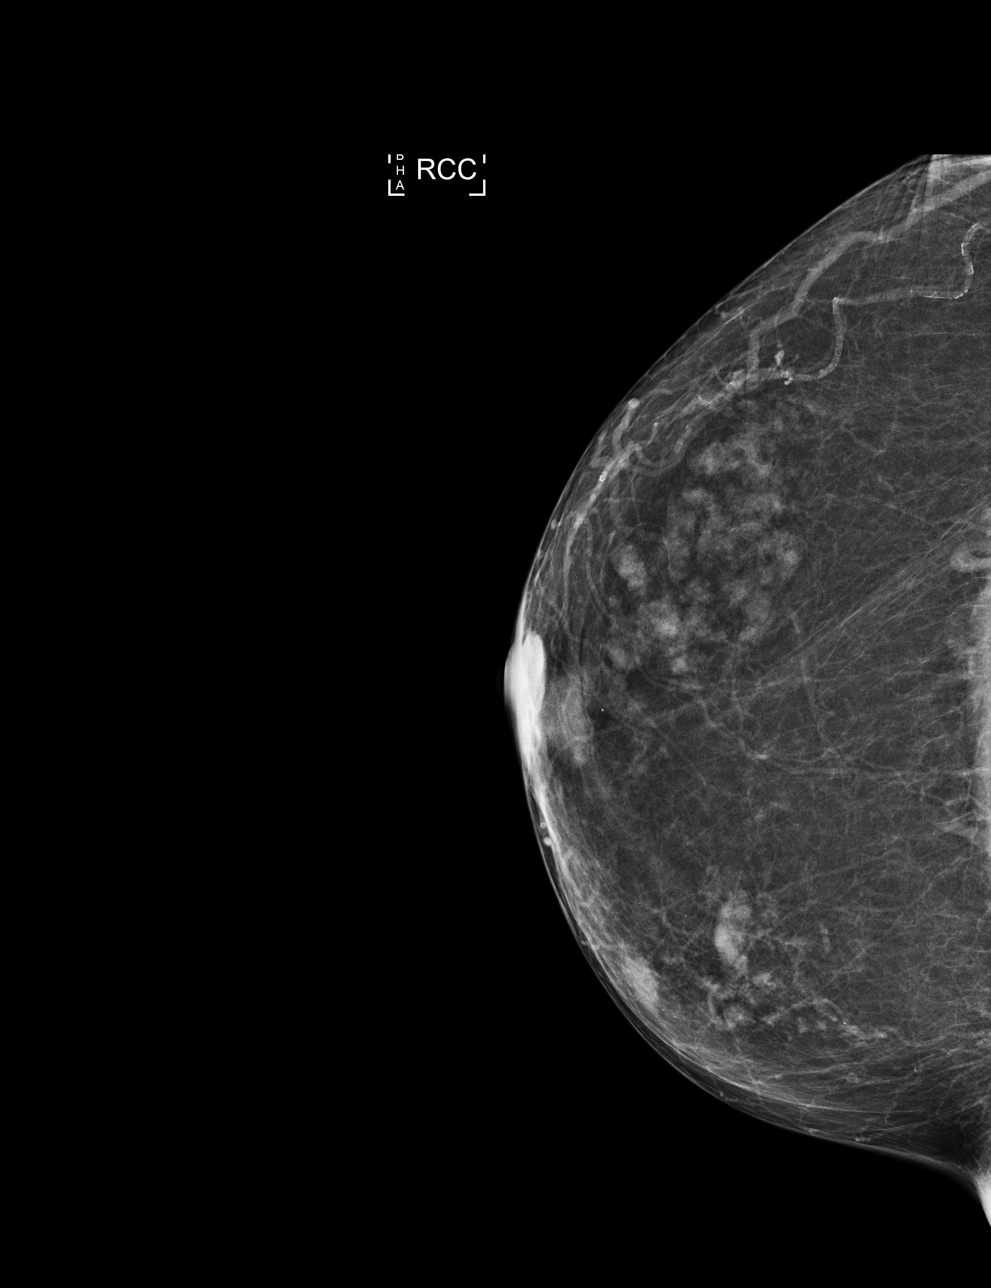

[L MLO]
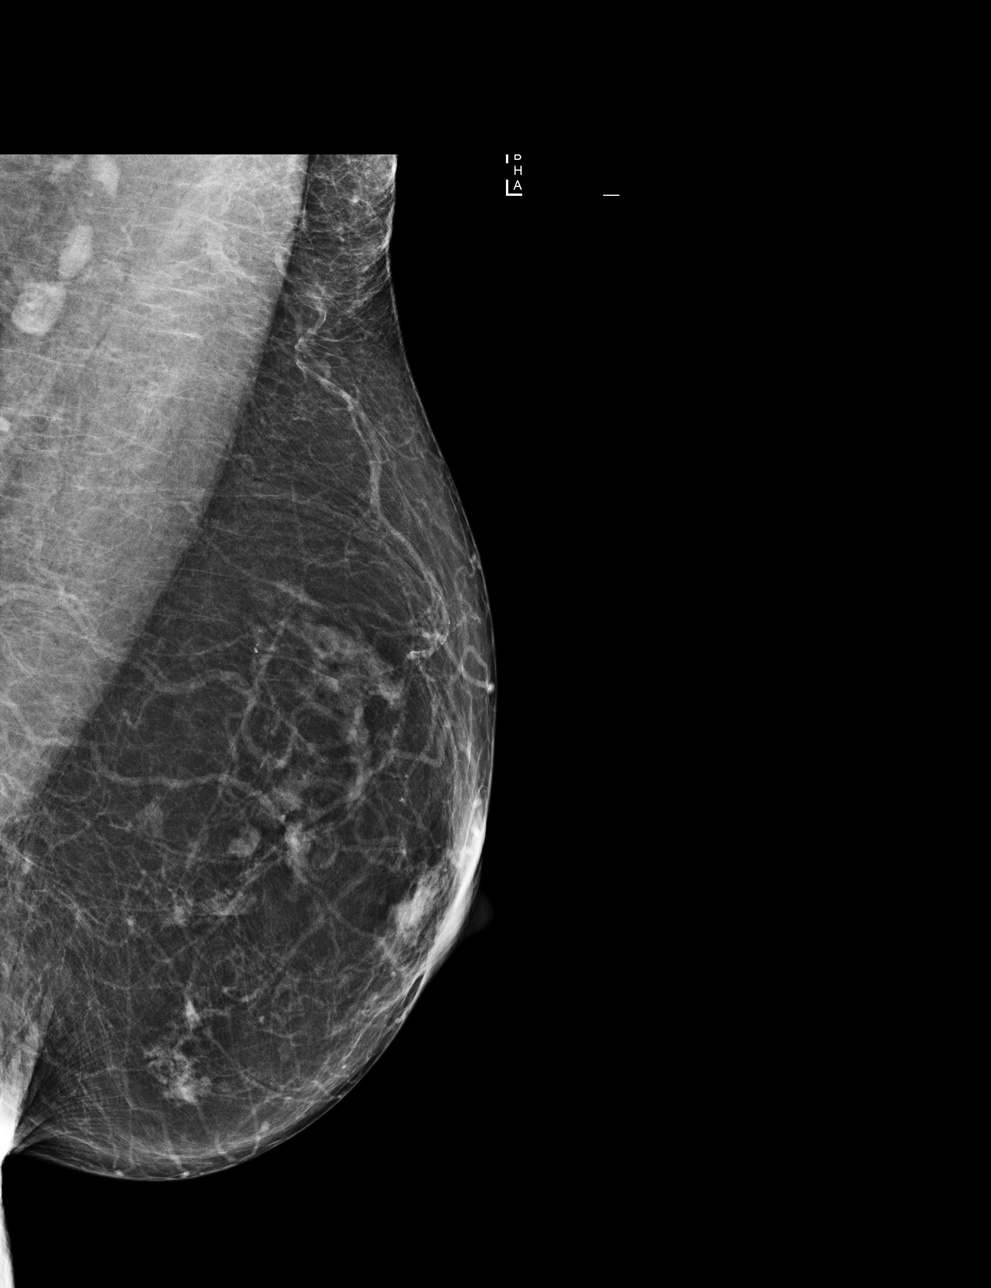

[4 of 4 positions shown; findings below may reference images not displayed]

ACR Breast Density Category b: There are scattered areas of
fibroglandular density.
FINDINGS: There are no findings suspicious for malignancy. Images were
processed with CAD.
IMPRESSION: No mammographic evidence of malignancy. A result letter of this
screening mammogram will be mailed directly to the patient.

RECOMMENDATION:
Screening mammogram in one year. (Code:AS-G-LCT)

BI-RADS CATEGORY  1: Negative.

## 2022-08-06 ENCOUNTER — Other Ambulatory Visit (HOSPITAL_COMMUNITY)
Admission: RE | Admit: 2022-08-06 | Discharge: 2022-08-06 | Disposition: A | Discharge status: Home / Self Care | Payer: Medicare Other | Source: Ambulatory Visit | Attending: Internal Medicine | Admitting: Internal Medicine

## 2022-08-06 ENCOUNTER — Other Ambulatory Visit: Payer: Medicare Other

## 2022-08-06 ENCOUNTER — Other Ambulatory Visit: Payer: Self-pay

## 2022-08-06 DIAGNOSIS — Z113 Encounter for screening for infections with a predominantly sexual mode of transmission: Secondary | ICD-10-CM

## 2022-08-06 DIAGNOSIS — B20 Human immunodeficiency virus [HIV] disease: Secondary | ICD-10-CM

## 2022-08-06 DIAGNOSIS — Z79899 Other long term (current) drug therapy: Secondary | ICD-10-CM

## 2022-08-07 LAB — URINE CYTOLOGY ANCILLARY ONLY
Chlamydia: NEGATIVE
Comment: NEGATIVE
Comment: NORMAL
Neisseria Gonorrhea: NEGATIVE

## 2022-08-07 LAB — T-HELPER CELL (CD4) - (RCID CLINIC ONLY)
CD4 % Helper T Cell: 31 % — ABNORMAL LOW (ref 33–65)
CD4 T Cell Abs: 675 /uL (ref 400–1790)

## 2022-08-10 LAB — COMPLETE METABOLIC PANEL WITH GFR
AG Ratio: 1.3 (calc) (ref 1.0–2.5)
ALT: 11 U/L (ref 6–29)
AST: 16 U/L (ref 10–35)
Albumin: 4.5 g/dL (ref 3.6–5.1)
Alkaline phosphatase (APISO): 49 U/L (ref 37–153)
BUN/Creatinine Ratio: 10 (calc) (ref 6–22)
BUN: 13 mg/dL (ref 7–25)
CO2: 23 mmol/L (ref 20–32)
Calcium: 9.6 mg/dL (ref 8.6–10.4)
Chloride: 105 mmol/L (ref 98–110)
Creat: 1.36 mg/dL — ABNORMAL HIGH (ref 0.60–1.00)
Globulin: 3.4 g/dL (calc) (ref 1.9–3.7)
Glucose, Bld: 114 mg/dL — ABNORMAL HIGH (ref 65–99)
Potassium: 3.8 mmol/L (ref 3.5–5.3)
Sodium: 139 mmol/L (ref 135–146)
Total Bilirubin: 0.4 mg/dL (ref 0.2–1.2)
Total Protein: 7.9 g/dL (ref 6.1–8.1)
eGFR: 40 mL/min/{1.73_m2} — ABNORMAL LOW (ref >=60)

## 2022-08-10 LAB — CBC WITH DIFFERENTIAL/PLATELET
Absolute Monocytes: 1525 cells/uL — ABNORMAL HIGH (ref 200–950)
Basophils Absolute: 49 cells/uL (ref 0–200)
Basophils Relative: 0.4 %
Eosinophils Absolute: 98 cells/uL (ref 15–500)
Eosinophils Relative: 0.8 %
HCT: 36 % (ref 35.0–45.0)
Hemoglobin: 12.3 g/dL (ref 11.7–15.5)
Lymphs Abs: 2891 cells/uL (ref 850–3900)
MCH: 28 pg (ref 27.0–33.0)
MCHC: 34.2 g/dL (ref 32.0–36.0)
MCV: 82 fL (ref 80.0–100.0)
MPV: 12 fL (ref 7.5–12.5)
Monocytes Relative: 12.5 %
Neutro Abs: 7637 cells/uL (ref 1500–7800)
Neutrophils Relative %: 62.6 %
Platelets: 279 10*3/uL (ref 140–400)
RBC: 4.39 10*6/uL (ref 3.80–5.10)
RDW: 12.6 % (ref 11.0–15.0)
Total Lymphocyte: 23.7 %
WBC: 12.2 10*3/uL — ABNORMAL HIGH (ref 3.8–10.8)

## 2022-08-10 LAB — HIV-1 RNA QUANT-NO REFLEX-BLD
HIV 1 RNA Quant: NOT DETECTED Copies/mL
HIV-1 RNA Quant, Log: NOT DETECTED Log cps/mL

## 2022-08-10 LAB — LIPID PANEL
Cholesterol: 150 mg/dL (ref <200)
HDL: 39 mg/dL — ABNORMAL LOW (ref >=50)
LDL Cholesterol (Calc): 82 mg/dL (calc)
Non-HDL Cholesterol (Calc): 111 mg/dL (calc) (ref <130)
Total CHOL/HDL Ratio: 3.8 (calc) (ref <5.0)
Triglycerides: 197 mg/dL — ABNORMAL HIGH (ref <150)

## 2022-08-10 LAB — RPR: RPR Ser Ql: NONREACTIVE

## 2022-08-20 ENCOUNTER — Ambulatory Visit (INDEPENDENT_AMBULATORY_CARE_PROVIDER_SITE_OTHER): Payer: Medicare Other | Admitting: Internal Medicine

## 2022-08-20 ENCOUNTER — Other Ambulatory Visit: Payer: Self-pay

## 2022-08-20 ENCOUNTER — Encounter: Payer: Self-pay | Admitting: Internal Medicine

## 2022-08-20 VITALS — BP 169/84 | HR 85 | Temp 97.7°F | Ht 60.0 in | Wt 96.0 lb

## 2022-08-20 DIAGNOSIS — B20 Human immunodeficiency virus [HIV] disease: Secondary | ICD-10-CM

## 2022-08-20 DIAGNOSIS — I10 Essential (primary) hypertension: Secondary | ICD-10-CM | POA: Diagnosis not present

## 2022-08-20 DIAGNOSIS — N183 Chronic kidney disease, stage 3 unspecified: Secondary | ICD-10-CM | POA: Diagnosis not present

## 2022-08-20 DIAGNOSIS — F411 Generalized anxiety disorder: Secondary | ICD-10-CM | POA: Diagnosis not present

## 2022-08-20 DIAGNOSIS — E782 Mixed hyperlipidemia: Secondary | ICD-10-CM | POA: Diagnosis not present

## 2022-08-20 MED ORDER — JULUCA 50-25 MG PO TABS: ORAL_TABLET | ORAL | 3 refills | Status: DC

## 2022-08-20 NOTE — Progress Notes (Signed)
RFV: follow up for hiv disease  Patient ID: Christine Dixon, female   DOB: 07-May-1946, 77 y.o.   MRN: [id number]  HPI 77yo F with well controlled hiv disease, htn, panic attacks. On juluca. 675/VL < 18 Aug 2022. She reports doing well with her medication. She still has some episodic panic attacks that she reports. She thinks her BP is only elevated at doctors visits but has not routinely checked at home.  Outpatient Encounter Medications as of 08/20/2022  Medication Sig   alendronate (FOSAMAX) 70 MG tablet Take 70 mg by mouth once a week.   amLODipine (NORVASC) 10 MG tablet Take one tablet by mouth daily.  **please contact md for more refills**   dolutegravir-rilpivirine (JULUCA) 50-25 MG tablet TAKE 1 TABLET BY MOUTH DAILY WITH A MEAL   gemfibrozil (LOPID) 600 MG tablet Take 600 mg by mouth 2 (two) times daily.   multivitamin-iron-minerals-folic acid (CENTRUM) chewable tablet Chew 1 tablet by mouth daily. Reported on 09/09/2015   vitamin B-12 (CYANOCOBALAMIN) 500 MCG tablet Take 500 mcg by mouth daily.   Vitamin D, Ergocalciferol, (DRISDOL) 1.25 MG (50000 UNIT) CAPS capsule Take 50,000 Units by mouth once a week.   No facility-administered encounter medications on file as of 08/20/2022.     Patient Active Problem List   Diagnosis Date Noted   Chronic kidney disease, stage 3a ([place]) 06/04/2020   GAD (generalized anxiety disorder) 12/29/2018   Panic disorder 10/05/2018   Essential hypertension 12/16/2017   Weakness 10/16/2017   Atypical chest pain 10/16/2017   Palpitations 10/16/2017   Tachycardia 10/16/2017   Papanicolaou smear of cervix with low grade squamous intraepithelial lesion (LGSIL) 10/08/2012   CROHN'S DISEASE, SMALL INTESTINE 03/25/2010   Crohn's disease, small intestine ([place]) 03/25/2010   HIV DISEASE 07/22/2006   HEPATITIS C 07/22/2006   ANEMIA OF CHRONIC DISEASE 07/22/2006   DYSPLASIA, CERVIX, MILD 07/22/2006   DYSPLASIA, VAGINA 07/22/2006   OSTEOPOROSIS 07/22/2006    Dysplasia, 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  HENT: Negative for congestion, sore throat, rhinorrhea, sneezing, trouble swallowing and sinus pressure.  Eyes: Negative for photophobia and visual disturbance.  Respiratory: Negative for cough, chest tightness, shortness of breath, wheezing and stridor.  Cardiovascular: Negative for chest pain, palpitations and leg swelling.  Gastrointestinal: Negative for nausea, vomiting, abdominal pain, diarrhea, constipation, blood in stool, abdominal distention and anal bleeding.  Genitourinary: Negative for dysuria, hematuria, flank pain and difficulty urinating.  Musculoskeletal: Negative for myalgias, back pain, joint swelling, arthralgias and gait problem.  Skin: Negative for color change, pallor, rash and wound.  Neurological: Negative for dizziness, tremors, weakness and light-headedness.  Hematological: Negative for adenopathy. Does not bruise/bleed easily.  Psychiatric/Behavioral: Negative for behavioral problems, confusion, sleep disturbance, dysphoric mood, decreased concentration and agitation.   Physical Exam   BP (!) 167/80   Pulse 90   Temp 97.7 F (36.5 C) (Temporal)   Ht 5' (1.524 m)   Wt 96 lb (43.5 kg)   SpO2 100%   BMI 18.75 kg/m   Physical Exam  Constitutional:  oriented to person, place, and time. appears well-developed and well-nourished. No distress.  HENT:  [place]/AT, PERRLA, no scleral icterus Mouth/Throat: Oropharynx is clear and moist. No oropharyngeal exudate.  Cardiovascular: Normal rate, regular rhythm and normal heart sounds. Exam reveals no gallop and no  friction rub.  No murmur heard.  Pulmonary/Chest: Effort normal and breath sounds normal. No respiratory distress.  has no wheezes.  Neck = supple, no nuchal rigidity Abdominal: Soft. Bowel sounds are normal.  exhibits no distension. There is no tenderness.  Lymphadenopathy: no cervical adenopathy. No axillary adenopathy Neurological: alert and oriented to person, place, and time.  Skin: Skin is warm and dry. No rash noted. No erythema.  Psychiatric: a normal mood and affect.  behavior is normal.   Lab Results  Component Value Date   CD4TCELL 31 (L) 08/06/2022   Lab Results  Component Value Date   CD4TABS 675 08/06/2022   CD4TABS 831 01/27/2022   CD4TABS 1,000 05/12/2021   Lab Results  Component Value Date   HIV1RNAQUANT Not Detected 08/06/2022   Lab Results  Component Value Date   HEPBSAB POS (A) 06/17/2015   Lab Results  Component Value Date   LABRPR NON-REACTIVE 08/06/2022    CBC Lab Results  Component Value Date   WBC 12.2 (H) 08/06/2022   RBC 4.39 08/06/2022   HGB 12.3 08/06/2022   HCT 36.0 08/06/2022   PLT 279 08/06/2022   MCV 82.0 08/06/2022   MCH 28.0 08/06/2022   MCHC 34.2 08/06/2022   RDW 12.6 08/06/2022   LYMPHSABS 2,891 08/06/2022   MONOABS 0.8 11/20/2020   EOSABS 98 08/06/2022    BMET Lab Results  Component Value Date   NA 139 08/06/2022   K 3.8 08/06/2022   CL 105 08/06/2022   CO2 23 08/06/2022   GLUCOSE 114 (H) 08/06/2022   BUN 13 08/06/2022   CREATININE 1.36 (H) 08/06/2022   CALCIUM 9.6 08/06/2022   GFRNONAA 48 (L) 11/20/2020   GFRAA 44 (L) 10/02/2020      Assessment and Plan Hiv disease= will plan to continue on Juluca  Ckd 3 = appears stable on recent labs  hyperTriglyceridemia - getting better with gemfibrizol and fish oil.  Panic disorder = she describes herself as always nervous person but she can't pinpoint what triggers. Does not want to take lexapro, concerns for addiction. We spent time to discuss starting it.   Htn  = suspect white coat syndrome. Will ask her to get BP cuff to record a few readings per week to bring to her PCP.

## 2022-12-10 NOTE — Progress Notes (Signed)
The 10-year ASCVD risk score (Arnett DK, et al., 2019) is: 11.3%   Values used to calculate the score:     Age: 77 years     Sex: Female     Is Non-Hispanic African American: Yes     Diabetic: No     Tobacco smoker: No     Systolic Blood Pressure: 130 mmHg     Is BP treated: Yes     HDL Cholesterol: 39 mg/dL     Total Cholesterol: 150 mg/dL  Sandie Ano, RN

## 2023-02-04 ENCOUNTER — Other Ambulatory Visit: Payer: Medicare Other

## 2023-02-18 ENCOUNTER — Ambulatory Visit: Payer: Medicare Other | Admitting: Internal Medicine

## 2023-02-24 ENCOUNTER — Other Ambulatory Visit: Payer: Self-pay

## 2023-02-24 DIAGNOSIS — Z113 Encounter for screening for infections with a predominantly sexual mode of transmission: Secondary | ICD-10-CM

## 2023-02-24 DIAGNOSIS — B20 Human immunodeficiency virus [HIV] disease: Secondary | ICD-10-CM

## 2023-02-25 ENCOUNTER — Other Ambulatory Visit: Payer: Self-pay

## 2023-02-25 ENCOUNTER — Other Ambulatory Visit: Payer: Medicare Other

## 2023-02-25 DIAGNOSIS — B20 Human immunodeficiency virus [HIV] disease: Secondary | ICD-10-CM

## 2023-02-25 DIAGNOSIS — Z113 Encounter for screening for infections with a predominantly sexual mode of transmission: Secondary | ICD-10-CM

## 2023-02-26 LAB — T-HELPER CELL (CD4) - (RCID CLINIC ONLY)
CD4 % Helper T Cell: 31 % — ABNORMAL LOW (ref 33–65)
CD4 T Cell Abs: 856 /uL (ref 400–1790)

## 2023-02-27 LAB — COMPLETE METABOLIC PANEL WITH GFR
AG Ratio: 1.5 (calc) (ref 1.0–2.5)
ALT: 25 U/L (ref 6–29)
AST: 26 U/L (ref 10–35)
Albumin: 4.5 g/dL (ref 3.6–5.1)
Alkaline phosphatase (APISO): 43 U/L (ref 37–153)
BUN/Creatinine Ratio: 9 (calc) (ref 6–22)
BUN: 13 mg/dL (ref 7–25)
CO2: 23 mmol/L (ref 20–32)
Calcium: 9.8 mg/dL (ref 8.6–10.4)
Chloride: 105 mmol/L (ref 98–110)
Creat: 1.44 mg/dL — ABNORMAL HIGH (ref 0.60–1.00)
Globulin: 3.1 g/dL (ref 1.9–3.7)
Glucose, Bld: 139 mg/dL — ABNORMAL HIGH (ref 65–99)
Potassium: 3.8 mmol/L (ref 3.5–5.3)
Sodium: 140 mmol/L (ref 135–146)
Total Bilirubin: 0.3 mg/dL (ref 0.2–1.2)
Total Protein: 7.6 g/dL (ref 6.1–8.1)
eGFR: 37 mL/min/{1.73_m2} — ABNORMAL LOW (ref >=60)

## 2023-02-27 LAB — HIV-1 RNA QUANT-NO REFLEX-BLD
HIV 1 RNA Quant: NOT DETECTED {copies}/mL
HIV-1 RNA Quant, Log: NOT DETECTED {Log_copies}/mL

## 2023-02-27 LAB — CBC WITH DIFFERENTIAL/PLATELET
Absolute Monocytes: 1177 {cells}/uL — ABNORMAL HIGH (ref 200–950)
Basophils Absolute: 54 {cells}/uL (ref 0–200)
Basophils Relative: 0.5 %
Eosinophils Absolute: 139 {cells}/uL (ref 15–500)
Eosinophils Relative: 1.3 %
HCT: 38.5 % (ref 35.0–45.0)
Hemoglobin: 12.9 g/dL (ref 11.7–15.5)
Lymphs Abs: 2836 {cells}/uL (ref 850–3900)
MCH: 27.4 pg (ref 27.0–33.0)
MCHC: 33.5 g/dL (ref 32.0–36.0)
MCV: 81.9 fL (ref 80.0–100.0)
MPV: 12 fL (ref 7.5–12.5)
Monocytes Relative: 11 %
Neutro Abs: 6495 cells/uL (ref 1500–7800)
Neutrophils Relative %: 60.7 %
Platelets: 284 10*3/uL (ref 140–400)
RBC: 4.7 10*6/uL (ref 3.80–5.10)
RDW: 13.5 % (ref 11.0–15.0)
Total Lymphocyte: 26.5 %
WBC: 10.7 10*3/uL (ref 3.8–10.8)

## 2023-02-27 LAB — RPR: RPR Ser Ql: NONREACTIVE

## 2023-03-11 ENCOUNTER — Ambulatory Visit: Payer: Medicare Other | Admitting: Internal Medicine

## 2023-04-05 ENCOUNTER — Ambulatory Visit: Payer: Medicare Other | Admitting: Internal Medicine

## 2023-04-05 ENCOUNTER — Encounter: Payer: Self-pay | Admitting: Internal Medicine

## 2023-04-05 ENCOUNTER — Other Ambulatory Visit: Payer: Self-pay

## 2023-04-05 VITALS — BP 169/82 | HR 118 | Temp 97.4°F | Ht 60.0 in | Wt 98.0 lb

## 2023-04-05 DIAGNOSIS — Z79899 Other long term (current) drug therapy: Secondary | ICD-10-CM

## 2023-04-05 DIAGNOSIS — B20 Human immunodeficiency virus [HIV] disease: Secondary | ICD-10-CM

## 2023-04-05 DIAGNOSIS — Z113 Encounter for screening for infections with a predominantly sexual mode of transmission: Secondary | ICD-10-CM

## 2023-04-05 DIAGNOSIS — Z23 Encounter for immunization: Secondary | ICD-10-CM

## 2023-04-05 MED ORDER — JULUCA 50-25 MG PO TABS
ORAL_TABLET | ORAL | 3 refills | Status: DC
Start: 2023-04-05 — End: 2024-03-16

## 2023-04-05 NOTE — Progress Notes (Signed)
RFV: follow up for hiv disease  Patient ID: Christine Dixon, female   DOB: 1946-06-28, 77 y.o.   MRN: [id number]  HPI Linde is a 77yo F with well controlled hiv disease on juluca. Not missing doses doing well. Still occasionally gets anxious. Otherwise no recent health issues  Outpatient Encounter Medications as of 04/05/2023  Medication Sig   alendronate (FOSAMAX) 70 MG tablet Take 70 mg by mouth once a week.   amLODipine (NORVASC) 10 MG tablet Take one tablet by mouth daily.  **please contact md for more refills**   dolutegravir-rilpivirine (JULUCA) 50-25 MG tablet TAKE 1 TABLET BY MOUTH DAILY WITH A MEAL   gemfibrozil (LOPID) 600 MG tablet Take 600 mg by mouth 2 (two) times daily.   multivitamin-iron-minerals-folic acid (CENTRUM) chewable tablet Chew 1 tablet by mouth daily. Reported on 09/09/2015   vitamin B-12 (CYANOCOBALAMIN) 500 MCG tablet Take 500 mcg by mouth daily.   Vitamin D, Ergocalciferol, (DRISDOL) 1.25 MG (50000 UNIT) CAPS capsule Take 50,000 Units by mouth once a week.   No facility-administered encounter medications on file as of 04/05/2023.     Patient Active Problem List   Diagnosis Date Noted   Chronic kidney disease, stage 3a ([facility]) 06/04/2020   GAD (generalized anxiety disorder) 12/29/2018   Panic disorder 10/05/2018   Essential hypertension 12/16/2017   Weakness 10/16/2017   Atypical chest pain 10/16/2017   Palpitations 10/16/2017   Tachycardia 10/16/2017   Papanicolaou smear of cervix with low grade squamous intraepithelial lesion (LGSIL) 10/08/2012   CROHN'S DISEASE, SMALL INTESTINE 03/25/2010   Crohn's disease, small intestine ([facility]) 03/25/2010   HIV DISEASE 07/22/2006   HEPATITIS C 07/22/2006   ANEMIA OF CHRONIC DISEASE 07/22/2006   DYSPLASIA, CERVIX, MILD 07/22/2006   DYSPLASIA, VAGINA 07/22/2006   Osteoporosis 07/22/2006   Dysplasia, 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 appears well-developed and well-nourished. No distress.  HENT: [place]/AT, PERRLA, no scleral icterus Mouth/Throat: Oropharynx is clear and moist. No oropharyngeal exudate.  Cardiovascular: Normal rate, regular rhythm and normal heart sounds. Exam reveals no gallop and no friction rub.  No murmur heard.  Pulmonary/Chest: Effort normal and breath sounds normal. No respiratory distress.  has no wheezes.  Neck = supple, no nuchal rigidity Abdominal: Soft. Bowel sounds are normal.  exhibits no distension. There is no tenderness.  Lymphadenopathy: no cervical adenopathy. No axillary adenopathy Neurological: alert and oriented to person, place, and time.  Skin: Skin is warm and dry. No rash noted. No erythema.  Psychiatric: a normal mood and affect.  behavior is normal.   Lab Results  Component Value Date   CD4TCELL 31 (L) 02/25/2023   Lab Results  Component Value Date   CD4TABS 856 02/25/2023   CD4TABS 675 08/06/2022   CD4TABS 831 01/27/2022   Lab Results  Component Value Date   HIV1RNAQUANT Not Detected 02/25/2023   Lab Results  Component Value Date  HEPBSAB POS (A) 06/17/2015   Lab Results  Component Value Date   LABRPR NON-REACTIVE 02/25/2023    CBC Lab Results  Component Value Date   WBC 10.7 02/25/2023   RBC 4.70 02/25/2023   HGB 12.9 02/25/2023   HCT 38.5 02/25/2023   PLT 284 02/25/2023   MCV 81.9 02/25/2023   MCH 27.4 02/25/2023   MCHC 33.5 02/25/2023   RDW 13.5 02/25/2023   LYMPHSABS 2,836 02/25/2023   MONOABS 0.8 11/20/2020   EOSABS 139  02/25/2023    BMET Lab Results  Component Value Date   NA 140 02/25/2023   K 3.8 02/25/2023   CL 105 02/25/2023   CO2 23 02/25/2023   GLUCOSE 139 (H) 02/25/2023   BUN 13 02/25/2023   CREATININE 1.44 (H) 02/25/2023   CALCIUM 9.8 02/25/2023   GFRNONAA 48 (L) 11/20/2020   GFRAA 44 (L) 10/02/2020      Assessment and Plan  Hiv disease = well controlled. Continue on juluca, will give refills  Long term medication management = cr is stable on recent labs  Ckd 3= stable  Health miantenance = will take flu vaccine today; then come back  in 2 wk for covid  Rtc for clinical visit in march

## 2023-04-27 ENCOUNTER — Other Ambulatory Visit: Payer: Self-pay

## 2023-04-27 ENCOUNTER — Ambulatory Visit (INDEPENDENT_AMBULATORY_CARE_PROVIDER_SITE_OTHER): Payer: Medicare Other

## 2023-04-27 DIAGNOSIS — Z23 Encounter for immunization: Secondary | ICD-10-CM | POA: Diagnosis present

## 2023-09-03 ENCOUNTER — Other Ambulatory Visit: Payer: Self-pay | Admitting: Internal Medicine

## 2023-09-03 DIAGNOSIS — B20 Human immunodeficiency virus [HIV] disease: Secondary | ICD-10-CM

## 2023-09-08 ENCOUNTER — Other Ambulatory Visit: Payer: Medicare Other

## 2023-09-08 ENCOUNTER — Other Ambulatory Visit: Payer: Self-pay

## 2023-09-08 DIAGNOSIS — Z79899 Other long term (current) drug therapy: Secondary | ICD-10-CM

## 2023-09-08 DIAGNOSIS — B20 Human immunodeficiency virus [HIV] disease: Secondary | ICD-10-CM

## 2023-09-09 LAB — T-HELPER CELL (CD4) - (RCID CLINIC ONLY)
CD4 % Helper T Cell: 30 % — ABNORMAL LOW (ref 33–65)
CD4 T Cell Abs: 910 /uL (ref 400–1790)

## 2023-09-10 LAB — COMPLETE METABOLIC PANEL WITH GFR
AG Ratio: 1.5 (calc) (ref 1.0–2.5)
ALT: 27 U/L (ref 6–29)
AST: 27 U/L (ref 10–35)
Albumin: 4.5 g/dL (ref 3.6–5.1)
Alkaline phosphatase (APISO): 40 U/L (ref 37–153)
BUN/Creatinine Ratio: 9 (calc) (ref 6–22)
BUN: 13 mg/dL (ref 7–25)
CO2: 26 mmol/L (ref 20–32)
Calcium: 9.8 mg/dL (ref 8.6–10.4)
Chloride: 103 mmol/L (ref 98–110)
Creat: 1.47 mg/dL — ABNORMAL HIGH (ref 0.60–1.00)
Globulin: 3 g/dL (ref 1.9–3.7)
Glucose, Bld: 104 mg/dL — ABNORMAL HIGH (ref 65–99)
Potassium: 3.9 mmol/L (ref 3.5–5.3)
Sodium: 138 mmol/L (ref 135–146)
Total Bilirubin: 0.5 mg/dL (ref 0.2–1.2)
Total Protein: 7.5 g/dL (ref 6.1–8.1)
eGFR: 36 mL/min/{1.73_m2} — ABNORMAL LOW (ref >=60)

## 2023-09-10 LAB — CBC WITH DIFFERENTIAL/PLATELET
Absolute Lymphocytes: 3190 {cells}/uL (ref 850–3900)
Absolute Monocytes: 1056 {cells}/uL — ABNORMAL HIGH (ref 200–950)
Basophils Absolute: 66 {cells}/uL (ref 0–200)
Basophils Relative: 0.6 %
Eosinophils Absolute: 110 {cells}/uL (ref 15–500)
Eosinophils Relative: 1 %
HCT: 37.3 % (ref 35.0–45.0)
Hemoglobin: 12.5 g/dL (ref 11.7–15.5)
MCH: 27.9 pg (ref 27.0–33.0)
MCHC: 33.5 g/dL (ref 32.0–36.0)
MCV: 83.3 fL (ref 80.0–100.0)
MPV: 11.3 fL (ref 7.5–12.5)
Monocytes Relative: 9.6 %
Neutro Abs: 6578 {cells}/uL (ref 1500–7800)
Neutrophils Relative %: 59.8 %
Platelets: 307 10*3/uL (ref 140–400)
RBC: 4.48 10*6/uL (ref 3.80–5.10)
RDW: 13.3 % (ref 11.0–15.0)
Total Lymphocyte: 29 %
WBC: 11 10*3/uL — ABNORMAL HIGH (ref 3.8–10.8)

## 2023-09-10 LAB — HIV-1 RNA QUANT-NO REFLEX-BLD
HIV 1 RNA Quant: 43 {copies}/mL — ABNORMAL HIGH
HIV-1 RNA Quant, Log: 1.63 {Log_copies}/mL — ABNORMAL HIGH

## 2023-09-20 ENCOUNTER — Ambulatory Visit (INDEPENDENT_AMBULATORY_CARE_PROVIDER_SITE_OTHER): Admitting: Internal Medicine

## 2023-09-20 ENCOUNTER — Other Ambulatory Visit: Payer: Self-pay

## 2023-09-20 ENCOUNTER — Encounter: Payer: Self-pay | Admitting: Internal Medicine

## 2023-09-20 VITALS — BP 170/84 | HR 116 | Resp 16 | Ht 60.0 in | Wt 100.4 lb

## 2023-09-20 DIAGNOSIS — E785 Hyperlipidemia, unspecified: Secondary | ICD-10-CM

## 2023-09-20 DIAGNOSIS — I129 Hypertensive chronic kidney disease with stage 1 through stage 4 chronic kidney disease, or unspecified chronic kidney disease: Secondary | ICD-10-CM

## 2023-09-20 DIAGNOSIS — I10 Essential (primary) hypertension: Secondary | ICD-10-CM

## 2023-09-20 DIAGNOSIS — N183 Chronic kidney disease, stage 3 unspecified: Secondary | ICD-10-CM

## 2023-09-20 DIAGNOSIS — Z87891 Personal history of nicotine dependence: Secondary | ICD-10-CM | POA: Diagnosis not present

## 2023-09-20 DIAGNOSIS — B20 Human immunodeficiency virus [HIV] disease: Secondary | ICD-10-CM | POA: Diagnosis present

## 2023-09-20 DIAGNOSIS — E782 Mixed hyperlipidemia: Secondary | ICD-10-CM

## 2023-09-20 NOTE — Progress Notes (Unsigned)
 Patient ID: Christine Dixon, female   DOB: 1945-08-11, 78 y.o.   MRN: [id number]  HPI In good health  Outpatient Encounter Medications as of 09/20/2023  Medication Sig   alendronate (FOSAMAX) 70 MG tablet Take 70 mg by mouth once a week.   amLODipine (NORVASC) 10 MG tablet Take one tablet by mouth daily.  **please contact md for more refills**   dolutegravir-rilpivirine (JULUCA) 50-25 MG tablet TAKE 1 TABLET BY MOUTH DAILY WITH A MEAL   gemfibrozil (LOPID) 600 MG tablet Take 600 mg by mouth 2 (two) times daily.   multivitamin-iron-minerals-folic acid (CENTRUM) chewable tablet Chew 1 tablet by mouth daily. Reported on 09/09/2015   vitamin B-12 (CYANOCOBALAMIN) 500 MCG tablet Take 500 mcg by mouth daily.   Vitamin D, Ergocalciferol, (DRISDOL) 1.25 MG (50000 UNIT) CAPS capsule Take 50,000 Units by mouth once a week.   No facility-administered encounter medications on file as of 09/20/2023.     Patient Active Problem List   Diagnosis Date Noted   Chronic kidney disease, stage 3a ([facility]) 06/04/2020   GAD (generalized anxiety disorder) 12/29/2018   Panic disorder 10/05/2018   Essential hypertension 12/16/2017   Weakness 10/16/2017   Atypical chest pain 10/16/2017   Palpitations 10/16/2017   Tachycardia 10/16/2017   Papanicolaou smear of cervix with low grade squamous intraepithelial lesion (LGSIL) 10/08/2012   CROHN'S DISEASE, SMALL INTESTINE 03/25/2010   Crohn's disease, small intestine ([facility]) 03/25/2010   HIV DISEASE 07/22/2006   HEPATITIS C 07/22/2006   ANEMIA OF CHRONIC DISEASE 07/22/2006   DYSPLASIA, CERVIX, MILD 07/22/2006   DYSPLASIA, VAGINA 07/22/2006   Osteoporosis 07/22/2006   Dysplasia, 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   Anemia of chronic disease 07/22/2006   HIV disease ([facility]) 07/22/2006   Osteoporosis 07/22/2006     Health Maintenance Due  Topic Date Due   DTaP/Tdap/Td (1 - Tdap) Never done     Review of Systems  Physical Exam   BP (!) 170/84   Pulse (!) 116   Resp 16    Ht 5' (1.524 m)   Wt 100 lb 6.4 oz (45.5 kg)   BMI 19.61 kg/m    Lab Results  Component Value Date   CD4TCELL 30 (L) 09/08/2023   Lab Results  Component Value Date   CD4TABS 910 09/08/2023   CD4TABS 856 02/25/2023   CD4TABS 675 08/06/2022   Lab Results  Component Value Date   HIV1RNAQUANT 43 (H) 09/08/2023   Lab Results  Component Value Date   HEPBSAB POS (A) 06/17/2015   Lab Results  Component Value Date   LABRPR NON-REACTIVE 02/25/2023    CBC Lab Results  Component Value Date   WBC 11.0 (H) 09/08/2023   RBC 4.48 09/08/2023   HGB 12.5 09/08/2023   HCT 37.3 09/08/2023   PLT 307 09/08/2023   MCV 83.3 09/08/2023   MCH 27.9 09/08/2023   MCHC 33.5 09/08/2023   RDW 13.3 09/08/2023   LYMPHSABS 2,836 02/25/2023   MONOABS 0.8 11/20/2020   EOSABS 110 09/08/2023    BMET Lab Results  Component Value Date   NA 138 09/08/2023   K 3.9 09/08/2023   CL 103 09/08/2023   CO2 26 09/08/2023   GLUCOSE 104 (H) 09/08/2023   BUN 13 09/08/2023   CREATININE 1.47 (H) 09/08/2023   CALCIUM 9.8 09/08/2023   GFRNONAA 48 (L) 11/20/2020   GFRAA 44 (L) 10/02/2020      Assessment and Plan   Reviewed labs Will do lipid profile Rtc in 6 mo

## 2023-09-21 LAB — LIPID PANEL
Cholesterol: 146 mg/dL (ref <200)
HDL: 50 mg/dL (ref >=50)
LDL Cholesterol (Calc): 59 mg/dL
Non-HDL Cholesterol (Calc): 96 mg/dL (ref <130)
Total CHOL/HDL Ratio: 2.9 (calc) (ref <5.0)
Triglycerides: 347 mg/dL — ABNORMAL HIGH (ref <150)

## 2023-09-22 ENCOUNTER — Ambulatory Visit: Payer: Self-pay | Admitting: Internal Medicine

## 2024-03-03 ENCOUNTER — Other Ambulatory Visit: Payer: Self-pay

## 2024-03-03 DIAGNOSIS — Z113 Encounter for screening for infections with a predominantly sexual mode of transmission: Secondary | ICD-10-CM

## 2024-03-03 DIAGNOSIS — Z79899 Other long term (current) drug therapy: Secondary | ICD-10-CM

## 2024-03-03 DIAGNOSIS — B20 Human immunodeficiency virus [HIV] disease: Secondary | ICD-10-CM

## 2024-03-09 ENCOUNTER — Other Ambulatory Visit

## 2024-03-09 ENCOUNTER — Other Ambulatory Visit: Payer: Self-pay

## 2024-03-09 DIAGNOSIS — B20 Human immunodeficiency virus [HIV] disease: Secondary | ICD-10-CM

## 2024-03-09 DIAGNOSIS — Z113 Encounter for screening for infections with a predominantly sexual mode of transmission: Secondary | ICD-10-CM

## 2024-03-10 LAB — T-HELPER CELL (CD4) - (RCID CLINIC ONLY)
CD4 % Helper T Cell: 33 % (ref 33–65)
CD4 T Cell Abs: 839 /uL (ref 400–1790)

## 2024-03-11 LAB — COMPLETE METABOLIC PANEL WITHOUT GFR
AG Ratio: 1.4 (calc) (ref 1.0–2.5)
ALT: 14 U/L (ref 6–29)
AST: 19 U/L (ref 10–35)
Albumin: 4.6 g/dL (ref 3.6–5.1)
Alkaline phosphatase (APISO): 49 U/L (ref 37–153)
BUN/Creatinine Ratio: 9 (calc) (ref 6–22)
BUN: 16 mg/dL (ref 7–25)
CO2: 23 mmol/L (ref 20–32)
Calcium: 9.1 mg/dL (ref 8.6–10.4)
Chloride: 108 mmol/L (ref 98–110)
Creat: 1.75 mg/dL — ABNORMAL HIGH (ref 0.60–1.00)
Globulin: 3.3 g/dL (ref 1.9–3.7)
Glucose, Bld: 126 mg/dL — ABNORMAL HIGH (ref 65–99)
Potassium: 3.4 mmol/L — ABNORMAL LOW (ref 3.5–5.3)
Sodium: 141 mmol/L (ref 135–146)
Total Bilirubin: 0.5 mg/dL (ref 0.2–1.2)
Total Protein: 7.9 g/dL (ref 6.1–8.1)

## 2024-03-11 LAB — CBC WITH DIFFERENTIAL/PLATELET
Absolute Lymphocytes: 2707 {cells}/uL (ref 850–3900)
Absolute Monocytes: 1038 {cells}/uL — ABNORMAL HIGH (ref 200–950)
Basophils Absolute: 64 {cells}/uL (ref 0–200)
Basophils Relative: 0.6 %
Eosinophils Absolute: 193 {cells}/uL (ref 15–500)
Eosinophils Relative: 1.8 %
HCT: 38.4 % (ref 35.0–45.0)
Hemoglobin: 12.6 g/dL (ref 11.7–15.5)
MCH: 27.1 pg (ref 27.0–33.0)
MCHC: 32.8 g/dL (ref 32.0–36.0)
MCV: 82.6 fL (ref 80.0–100.0)
MPV: 11.8 fL (ref 7.5–12.5)
Monocytes Relative: 9.7 %
Neutro Abs: 6698 {cells}/uL (ref 1500–7800)
Neutrophils Relative %: 62.6 %
Platelets: 340 Thousand/uL (ref 140–400)
RBC: 4.65 Million/uL (ref 3.80–5.10)
RDW: 13.4 % (ref 11.0–15.0)
Total Lymphocyte: 25.3 %
WBC: 10.7 Thousand/uL (ref 3.8–10.8)

## 2024-03-11 LAB — RPR: RPR Ser Ql: NONREACTIVE

## 2024-03-11 LAB — HIV-1 RNA QUANT-NO REFLEX-BLD
HIV 1 RNA Quant: <20 {copies}/mL — AB
HIV-1 RNA Quant, Log: <1.3 {Log_copies}/mL — AB

## 2024-03-16 ENCOUNTER — Other Ambulatory Visit: Payer: Self-pay

## 2024-03-16 ENCOUNTER — Encounter: Payer: Self-pay | Admitting: Internal Medicine

## 2024-03-16 ENCOUNTER — Ambulatory Visit: Admitting: Internal Medicine

## 2024-03-16 VITALS — BP 179/73 | HR 58 | Temp 97.2°F | Ht 60.0 in | Wt 94.0 lb

## 2024-03-16 DIAGNOSIS — Z79899 Other long term (current) drug therapy: Secondary | ICD-10-CM | POA: Diagnosis not present

## 2024-03-16 DIAGNOSIS — R739 Hyperglycemia, unspecified: Secondary | ICD-10-CM

## 2024-03-16 DIAGNOSIS — B20 Human immunodeficiency virus [HIV] disease: Secondary | ICD-10-CM

## 2024-03-16 DIAGNOSIS — N183 Chronic kidney disease, stage 3 unspecified: Secondary | ICD-10-CM

## 2024-03-16 DIAGNOSIS — I129 Hypertensive chronic kidney disease with stage 1 through stage 4 chronic kidney disease, or unspecified chronic kidney disease: Secondary | ICD-10-CM

## 2024-03-16 DIAGNOSIS — I10 Essential (primary) hypertension: Secondary | ICD-10-CM

## 2024-03-16 MED ORDER — COVID-19 MRNA VAC-TRIS(PFIZER) 30 MCG/0.3ML IM SUSY
0.3000 mL | PREFILLED_SYRINGE | Freq: Once | INTRAMUSCULAR | 0 refills | Status: AC
Start: 2024-03-16 — End: 2024-03-17
  Filled 2024-03-16: qty 0.3, 1d supply, fill #0

## 2024-03-16 MED ORDER — JULUCA 50-25 MG PO TABS: ORAL_TABLET | ORAL | 3 refills | Status: DC

## 2024-03-16 NOTE — Progress Notes (Signed)
 RFV: follow up for hiv disease  Patient ID: Christine Dixon, female   DOB: 08-01-1945, 78 y.o.   MRN: [id number]  HPI  78 yo F with hiv disease, CD 4 count 839/VL<20 in Sep 2025. On juluca . She has been doing well overall. She reports that her BP has been doing well.  Outpatient Encounter Medications as of 03/16/2024  Medication Sig   alendronate (FOSAMAX) 70 MG tablet Take 70 mg by mouth once a week.   amLODipine  (NORVASC ) 10 MG tablet Take one tablet by mouth daily.  **please contact md for more refills**   dolutegravir -rilpivirine  (JULUCA ) 50-25 MG tablet TAKE 1 TABLET BY MOUTH DAILY WITH A MEAL   gemfibrozil (LOPID) 600 MG tablet Take 600 mg by mouth 2 (two) times daily.   multivitamin-iron-minerals-folic acid (CENTRUM) chewable tablet Chew 1 tablet by mouth daily. Reported on 09/09/2015   vitamin B-12 (CYANOCOBALAMIN) 500 MCG tablet Take 500 mcg by mouth daily.   Vitamin D , Ergocalciferol , (DRISDOL ) 1.25 MG (50000 UNIT) CAPS capsule Take 50,000 Units by mouth once a week.   No facility-administered encounter medications on file as of 03/16/2024.     Patient Active Problem List   Diagnosis Date Noted   Chronic kidney disease, stage 3a ([facility]) 06/04/2020   GAD (generalized anxiety disorder) 12/29/2018   Panic disorder 10/05/2018   Essential hypertension 12/16/2017   Weakness 10/16/2017   Atypical chest pain 10/16/2017   Palpitations 10/16/2017   Tachycardia 10/16/2017   Papanicolaou smear of cervix with low grade squamous intraepithelial lesion (LGSIL) 10/08/2012   CROHN'S DISEASE, SMALL INTESTINE 03/25/2010   Crohn's disease, small intestine ([facility]) 03/25/2010   HIV DISEASE 07/22/2006   HEPATITIS C 07/22/2006   ANEMIA OF CHRONIC DISEASE 07/22/2006   DYSPLASIA, CERVIX, MILD 07/22/2006   DYSPLASIA, VAGINA 07/22/2006   Osteoporosis 07/22/2006   Dysplasia, 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  HENT: Negative for congestion, sore throat, rhinorrhea, sneezing, trouble swallowing and sinus pressure.  Eyes: Negative for photophobia and visual disturbance.  Respiratory: Negative for cough, chest tightness, shortness of breath, wheezing and stridor.  Cardiovascular: Negative for chest pain, palpitations and leg swelling.  Gastrointestinal: Negative for nausea, vomiting, abdominal pain, diarrhea, constipation, blood in stool, abdominal distention and anal bleeding.  Genitourinary: Negative for dysuria, hematuria, flank pain and difficulty urinating.  Musculoskeletal: Negative for myalgias, back pain, joint swelling, arthralgias and gait problem.  Skin: Negative for color change, pallor, rash and wound.  Neurological: Negative for dizziness, tremors, weakness and light-headedness.  Hematological: Negative for adenopathy. Does not bruise/bleed easily.  Psychiatric/Behavioral: Negative for behavioral problems, confusion, sleep disturbance, dysphoric mood, decreased concentration and agitation.   Physical Exam   BP (!) 179/73   Pulse (!) 58   Temp (!) 97.2 F (36.2 C) (Temporal)   Ht 5' (1.524 m)   Wt 94 lb (42.6 kg)   SpO2 97%   BMI 18.36 kg/m   Physical Exam  Constitutional:  oriented to person, place, and time. appears well-developed and well-nourished. No distress.  HENT: [place]/AT, PERRLA, no scleral icterus Mouth/Throat: Oropharynx is clear and moist. No oropharyngeal  exudate.  Cardiovascular: Normal rate, regular rhythm and normal heart sounds. Exam reveals no gallop and no friction rub.  No murmur heard.  Pulmonary/Chest: Effort normal and breath sounds normal.  No respiratory distress.  has no wheezes.  Neck = supple, no nuchal rigidity,  Lymphadenopathy: no cervical adenopathy. No axillary adenopathy Neurological: alert and oriented to person, place, and time.  Skin: Skin is warm and dry. No rash noted. No erythema.  Psychiatric: a normal mood and affect.  behavior is normal.   Lab Results  Component Value Date   CD4TCELL 33 03/09/2024   Lab Results  Component Value Date   CD4TABS 839 03/09/2024   CD4TABS 910 09/08/2023   CD4TABS 856 02/25/2023   Lab Results  Component Value Date   HIV1RNAQUANT <20 DETECTED (A) 03/09/2024   Lab Results  Component Value Date   HEPBSAB POS (A) 06/17/2015   Lab Results  Component Value Date   LABRPR NON-REACTIVE 03/09/2024    CBC Lab Results  Component Value Date   WBC 10.7 03/09/2024   RBC 4.65 03/09/2024   HGB 12.6 03/09/2024   HCT 38.4 03/09/2024   PLT 340 03/09/2024   MCV 82.6 03/09/2024   MCH 27.1 03/09/2024   MCHC 32.8 03/09/2024   RDW 13.4 03/09/2024   LYMPHSABS 2,836 02/25/2023   MONOABS 0.8 11/20/2020   EOSABS 193 03/09/2024    BMET Lab Results  Component Value Date   NA 141 03/09/2024   K 3.4 (L) 03/09/2024   CL 108 03/09/2024   CO2 23 03/09/2024   GLUCOSE 126 (H) 03/09/2024   BUN 16 03/09/2024   CREATININE 1.75 (H) 03/09/2024   CALCIUM 9.1 03/09/2024   GFRNONAA 48 (L) 11/20/2020   GFRAA 44 (L) 10/02/2020      Assessment and Plan  Hiv =her labs show that she is well controlled. We will continue to provide juluca  refill  Ckd 3 = she is now under care with nephrology. Appears stable  Health maintenance = received flu already. But recommend covid 19 vacicne  Htn = above goal during this visit, suspect she has white coat syndrome

## 2024-03-17 ENCOUNTER — Other Ambulatory Visit: Payer: Self-pay

## 2024-03-23 ENCOUNTER — Ambulatory Visit: Admitting: Internal Medicine

## 2024-04-25 ENCOUNTER — Inpatient Hospital Stay (HOSPITAL_BASED_OUTPATIENT_CLINIC_OR_DEPARTMENT_OTHER)
Admission: EM | Admit: 2024-04-25 | Discharge: 2024-04-27 | DRG: 975 | Disposition: A | Discharge status: Home / Self Care | Attending: Internal Medicine | Admitting: Internal Medicine

## 2024-04-25 ENCOUNTER — Emergency Department (HOSPITAL_BASED_OUTPATIENT_CLINIC_OR_DEPARTMENT_OTHER)

## 2024-04-25 ENCOUNTER — Encounter (HOSPITAL_BASED_OUTPATIENT_CLINIC_OR_DEPARTMENT_OTHER): Payer: Self-pay

## 2024-04-25 ENCOUNTER — Other Ambulatory Visit: Payer: Self-pay

## 2024-04-25 DIAGNOSIS — K5090 Crohn's disease, unspecified, without complications: Secondary | ICD-10-CM | POA: Diagnosis present

## 2024-04-25 DIAGNOSIS — Z8249 Family history of ischemic heart disease and other diseases of the circulatory system: Secondary | ICD-10-CM

## 2024-04-25 DIAGNOSIS — B182 Chronic viral hepatitis C: Secondary | ICD-10-CM | POA: Diagnosis present

## 2024-04-25 DIAGNOSIS — N179 Acute kidney failure, unspecified: Secondary | ICD-10-CM | POA: Diagnosis present

## 2024-04-25 DIAGNOSIS — I10 Essential (primary) hypertension: Secondary | ICD-10-CM | POA: Diagnosis not present

## 2024-04-25 DIAGNOSIS — Z87442 Personal history of urinary calculi: Secondary | ICD-10-CM

## 2024-04-25 DIAGNOSIS — R1084 Generalized abdominal pain: Secondary | ICD-10-CM | POA: Diagnosis not present

## 2024-04-25 DIAGNOSIS — I129 Hypertensive chronic kidney disease with stage 1 through stage 4 chronic kidney disease, or unspecified chronic kidney disease: Secondary | ICD-10-CM | POA: Diagnosis present

## 2024-04-25 DIAGNOSIS — K7460 Unspecified cirrhosis of liver: Secondary | ICD-10-CM | POA: Diagnosis present

## 2024-04-25 DIAGNOSIS — Z832 Family history of diseases of the blood and blood-forming organs and certain disorders involving the immune mechanism: Secondary | ICD-10-CM

## 2024-04-25 DIAGNOSIS — N201 Calculus of ureter: Principal | ICD-10-CM

## 2024-04-25 DIAGNOSIS — N136 Pyonephrosis: Secondary | ICD-10-CM | POA: Diagnosis present

## 2024-04-25 DIAGNOSIS — Z79899 Other long term (current) drug therapy: Secondary | ICD-10-CM

## 2024-04-25 DIAGNOSIS — D72828 Other elevated white blood cell count: Secondary | ICD-10-CM | POA: Diagnosis present

## 2024-04-25 DIAGNOSIS — A419 Sepsis, unspecified organism: Secondary | ICD-10-CM | POA: Diagnosis not present

## 2024-04-25 DIAGNOSIS — D631 Anemia in chronic kidney disease: Secondary | ICD-10-CM | POA: Diagnosis present

## 2024-04-25 DIAGNOSIS — K8020 Calculus of gallbladder without cholecystitis without obstruction: Secondary | ICD-10-CM | POA: Diagnosis present

## 2024-04-25 DIAGNOSIS — Z7983 Long term (current) use of bisphosphonates: Secondary | ICD-10-CM

## 2024-04-25 DIAGNOSIS — N200 Calculus of kidney: Secondary | ICD-10-CM

## 2024-04-25 DIAGNOSIS — N390 Urinary tract infection, site not specified: Secondary | ICD-10-CM | POA: Diagnosis present

## 2024-04-25 DIAGNOSIS — Z885 Allergy status to narcotic agent status: Secondary | ICD-10-CM

## 2024-04-25 DIAGNOSIS — B20 Human immunodeficiency virus [HIV] disease: Secondary | ICD-10-CM | POA: Diagnosis present

## 2024-04-25 DIAGNOSIS — Z87891 Personal history of nicotine dependence: Secondary | ICD-10-CM

## 2024-04-25 DIAGNOSIS — Z886 Allergy status to analgesic agent status: Secondary | ICD-10-CM

## 2024-04-25 DIAGNOSIS — R1031 Right lower quadrant pain: Secondary | ICD-10-CM | POA: Diagnosis present

## 2024-04-25 DIAGNOSIS — Z833 Family history of diabetes mellitus: Secondary | ICD-10-CM

## 2024-04-25 DIAGNOSIS — N1831 Chronic kidney disease, stage 3a: Secondary | ICD-10-CM | POA: Diagnosis present

## 2024-04-25 DIAGNOSIS — R6510 Systemic inflammatory response syndrome (SIRS) of non-infectious origin without acute organ dysfunction: Secondary | ICD-10-CM | POA: Diagnosis present

## 2024-04-25 DIAGNOSIS — Z743 Need for continuous supervision: Secondary | ICD-10-CM | POA: Diagnosis not present

## 2024-04-25 DIAGNOSIS — F411 Generalized anxiety disorder: Secondary | ICD-10-CM | POA: Diagnosis present

## 2024-04-25 DIAGNOSIS — K5000 Crohn's disease of small intestine without complications: Secondary | ICD-10-CM | POA: Diagnosis present

## 2024-04-25 DIAGNOSIS — I700 Atherosclerosis of aorta: Secondary | ICD-10-CM | POA: Diagnosis present

## 2024-04-25 DIAGNOSIS — D573 Sickle-cell trait: Secondary | ICD-10-CM | POA: Diagnosis present

## 2024-04-25 DIAGNOSIS — B1710 Acute hepatitis C without hepatic coma: Secondary | ICD-10-CM | POA: Diagnosis present

## 2024-04-25 LAB — COMPREHENSIVE METABOLIC PANEL WITH GFR
ALT: 22 U/L (ref 0–44)
AST: 30 U/L (ref 15–41)
Albumin: 4.7 g/dL (ref 3.5–5.0)
Alkaline Phosphatase: 51 U/L (ref 38–126)
Anion gap: 13 (ref 5–15)
BUN: 18 mg/dL (ref 8–23)
CO2: 22 mmol/L (ref 22–32)
Calcium: 9.7 mg/dL (ref 8.9–10.3)
Chloride: 104 mmol/L (ref 98–111)
Creatinine, Ser: 1.6 mg/dL — ABNORMAL HIGH (ref 0.44–1.00)
GFR, Estimated: 32 mL/min — ABNORMAL LOW (ref >60)
Glucose, Bld: 104 mg/dL — ABNORMAL HIGH (ref 70–99)
Potassium: 3.8 mmol/L (ref 3.5–5.1)
Sodium: 140 mmol/L (ref 135–145)
Total Bilirubin: 0.4 mg/dL (ref 0.0–1.2)
Total Protein: 8 g/dL (ref 6.5–8.1)

## 2024-04-25 LAB — CBC WITH DIFFERENTIAL/PLATELET
Abs Immature Granulocytes: 0.13 K/uL — ABNORMAL HIGH (ref 0.00–0.07)
Basophils Absolute: 0.1 K/uL (ref 0.0–0.1)
Basophils Relative: 0 %
Eosinophils Absolute: 0 K/uL (ref 0.0–0.5)
Eosinophils Relative: 0 %
HCT: 39.4 % (ref 36.0–46.0)
Hemoglobin: 12.8 g/dL (ref 12.0–15.0)
Immature Granulocytes: 1 %
Lymphocytes Relative: 7 %
Lymphs Abs: 1.3 K/uL (ref 0.7–4.0)
MCH: 27.2 pg (ref 26.0–34.0)
MCHC: 32.5 g/dL (ref 30.0–36.0)
MCV: 83.8 fL (ref 80.0–100.0)
Monocytes Absolute: 1.4 K/uL — ABNORMAL HIGH (ref 0.1–1.0)
Monocytes Relative: 7 %
Neutro Abs: 17 K/uL — ABNORMAL HIGH (ref 1.7–7.7)
Neutrophils Relative %: 85 %
Platelets: 326 K/uL (ref 150–400)
RBC: 4.7 MIL/uL (ref 3.87–5.11)
RDW: 13.5 % (ref 11.5–15.5)
WBC: 19.9 K/uL — ABNORMAL HIGH (ref 4.0–10.5)
nRBC: 0 % (ref 0.0–0.2)

## 2024-04-25 LAB — URINALYSIS, ROUTINE W REFLEX MICROSCOPIC
Bacteria, UA: NONE SEEN
Bilirubin Urine: NEGATIVE
Glucose, UA: NEGATIVE mg/dL
Ketones, ur: NEGATIVE mg/dL
Nitrite: NEGATIVE
Protein, ur: 30 mg/dL — AB
RBC / HPF: >50 RBC/hpf (ref 0–5)
Specific Gravity, Urine: 1.016 (ref 1.005–1.030)
pH: 5.5 (ref 5.0–8.0)

## 2024-04-25 LAB — LIPASE, BLOOD: Lipase: 29 U/L (ref 11–51)

## 2024-04-25 MED ORDER — ONDANSETRON HCL 4 MG PO TABS: 4.0000 mg | ORAL_TABLET | Freq: Four times a day (QID) | ORAL | Status: DC | PRN

## 2024-04-25 MED ORDER — AMLODIPINE BESYLATE 10 MG PO TABS
10.0000 mg | ORAL_TABLET | Freq: Every day | ORAL | Status: DC
Start: 2024-04-25 — End: 2024-04-27
  Administered 2024-04-25 – 2024-04-26 (×2): 10 mg via ORAL
  Filled 2024-04-25 (×2): qty 1

## 2024-04-25 MED ORDER — RILPIVIRINE HCL 25 MG PO TABS
25.0000 mg | ORAL_TABLET | Freq: Every day | ORAL | Status: DC
  Administered 2024-04-26: 25 mg via ORAL
  Filled 2024-04-25 (×3): qty 1

## 2024-04-25 MED ORDER — GEMFIBROZIL 600 MG PO TABS
600.0000 mg | ORAL_TABLET | Freq: Two times a day (BID) | ORAL | Status: DC
  Administered 2024-04-26 – 2024-04-27 (×3): 600 mg via ORAL
  Filled 2024-04-25 (×5): qty 1

## 2024-04-25 MED ORDER — VITAMIN D (ERGOCALCIFEROL) 1.25 MG (50000 UNIT) PO CAPS
50000.0000 [IU] | ORAL_CAPSULE | ORAL | Status: DC
  Administered 2024-04-26: 50000 [IU] via ORAL
  Filled 2024-04-25 (×2): qty 1

## 2024-04-25 MED ORDER — DOLUTEGRAVIR SODIUM 50 MG PO TABS
50.0000 mg | ORAL_TABLET | Freq: Every day | ORAL | Status: DC
  Administered 2024-04-26: 50 mg via ORAL
  Filled 2024-04-25 (×3): qty 1

## 2024-04-25 MED ORDER — OXYCODONE-ACETAMINOPHEN 5-325 MG PO TABS
1.0000 | ORAL_TABLET | Freq: Once | ORAL | Status: DC
Start: 2024-04-25 — End: 2024-04-25

## 2024-04-25 MED ORDER — SODIUM CHLORIDE 0.9 % IV SOLN: 2.0000 g | Freq: Once | INTRAVENOUS | Status: DC

## 2024-04-25 MED ORDER — ADULT MULTIVITAMIN W/MINERALS CH
1.0000 | ORAL_TABLET | Freq: Every day | ORAL | Status: DC
  Administered 2024-04-26 – 2024-04-27 (×2): 1 via ORAL
  Filled 2024-04-25 (×3): qty 1

## 2024-04-25 MED ORDER — SODIUM CHLORIDE 0.9 % IV SOLN
1.0000 g | INTRAVENOUS | Status: DC
  Administered 2024-04-26 – 2024-04-27 (×2): 1 g via INTRAVENOUS
  Filled 2024-04-25 (×2): qty 10

## 2024-04-25 MED ORDER — IOHEXOL 300 MG/ML  SOLN
70.0000 mL | Freq: Once | INTRAMUSCULAR | Status: AC | PRN
  Administered 2024-04-25: 70 mL via INTRAVENOUS

## 2024-04-25 MED ORDER — ACETAMINOPHEN 325 MG PO TABS
650.0000 mg | ORAL_TABLET | Freq: Once | ORAL | Status: AC
  Administered 2024-04-25: 650 mg via ORAL
  Filled 2024-04-25: qty 2

## 2024-04-25 MED ORDER — HYDROMORPHONE HCL 1 MG/ML IJ SOLN: 0.5000 mg | INTRAMUSCULAR | Status: DC | PRN

## 2024-04-25 MED ORDER — DOLUTEGRAVIR-RILPIVIRINE 50-25 MG PO TABS: 1.0000 | ORAL_TABLET | Freq: Every day | ORAL | Status: DC

## 2024-04-25 MED ORDER — FENTANYL CITRATE (PF) 50 MCG/ML IJ SOSY
25.0000 ug | PREFILLED_SYRINGE | Freq: Once | INTRAMUSCULAR | Status: DC
Start: 2024-04-25 — End: 2024-04-27
  Filled 2024-04-25: qty 1

## 2024-04-25 MED ORDER — LACTATED RINGERS IV BOLUS
500.0000 mL | Freq: Once | INTRAVENOUS | Status: AC
  Administered 2024-04-25: 500 mL via INTRAVENOUS

## 2024-04-25 MED ORDER — SODIUM CHLORIDE 0.9 % IV SOLN
1.0000 g | Freq: Once | INTRAVENOUS | Status: AC
  Administered 2024-04-25: 1 g via INTRAVENOUS
  Filled 2024-04-25: qty 10

## 2024-04-25 MED ORDER — ONDANSETRON HCL 4 MG/2ML IJ SOLN: 4.0000 mg | Freq: Four times a day (QID) | INTRAMUSCULAR | Status: DC | PRN

## 2024-04-25 MED ORDER — TAMSULOSIN HCL 0.4 MG PO CAPS
0.4000 mg | ORAL_CAPSULE | Freq: Every day | ORAL | Status: DC
  Administered 2024-04-25 – 2024-04-27 (×3): 0.4 mg via ORAL
  Filled 2024-04-25 (×4): qty 1

## 2024-04-25 MED ORDER — FUROSEMIDE 10 MG/ML IJ SOLN: 40.0000 mg | Freq: Once | INTRAMUSCULAR | Status: DC

## 2024-04-25 MED ORDER — VITAMIN B-12 1000 MCG PO TABS
500.0000 ug | ORAL_TABLET | Freq: Every day | ORAL | Status: DC
  Administered 2024-04-26 – 2024-04-27 (×2): 500 ug via ORAL
  Filled 2024-04-25 (×3): qty 1

## 2024-04-25 NOTE — ED Provider Notes (Signed)
 [place] EMERGENCY DEPARTMENT AT [facility] Provider Note   CSN: [id number] Arrival date & time: 04/25/24  1321     Patient presents with: Abdominal Pain   Christine Dixon is a 78 y.o. female.  Abdominal Pain  Patient is a 78 year old female presented ED today for concerns for right lower quad abdominal pain that started acutely this morning when she woke.  Notes that the pain does radiate to right flank.  Pain is constant, not relenting.  Noted to have 2 normal bowel movements this morning.  Has been taking HIV medications faithfully and has not started any new medications recently.  Previous history of HIV, chronic hepatitis C, Crohn disease, HTN, CKD.  Notes that she has not been experiencing any other symptoms otherwise.   Denies fever, headache, vision changes, chest pain, shortness of breath, nausea, vomiting, diarrhea, dysuria, hematuria, vaginal discharge, vaginal bleeding, vaginal pain, lower leg swelling, rashes.  Prior to Admission medications   Medication Sig Start Date End Date Taking? Authorizing Provider  alendronate (FOSAMAX) 70 MG tablet Take 70 mg by mouth once a week. 01/26/22   [provider]  amLODipine  (NORVASC ) 10 MG tablet Take one tablet by mouth daily.  **please contact md for more refills** 12/23/20   Luiz Channel, MD  dolutegravir -rilpivirine  (JULUCA ) 50-25 MG tablet TAKE 1 TABLET BY MOUTH DAILY WITH A MEAL 03/16/24   Luiz Channel, MD  gemfibrozil (LOPID) 600 MG tablet Take 600 mg by mouth 2 (two) times daily. 01/15/22   [provider]  multivitamin-iron-minerals-folic acid (CENTRUM) chewable tablet Chew 1 tablet by mouth daily. Reported on 09/09/2015    [provider]  vitamin B-12 (CYANOCOBALAMIN) 500 MCG tablet Take 500 mcg by mouth daily. 06/05/20   [provider]  Vitamin D , Ergocalciferol , (DRISDOL ) 1.25 MG (50000 UNIT) CAPS capsule Take 50,000 Units by mouth once a week. 06/05/20   [provider]    Allergies: Aspirin and Codeine    Review of Systems  Gastrointestinal:  Positive for abdominal pain.  All other systems reviewed and are negative.   Updated Vital Signs BP (!) 156/71   Pulse 85   Temp 98.4 F (36.9 C)   Resp 17   SpO2 100%   Physical Exam Vitals and nursing note reviewed.  Constitutional:      General: She is not in acute distress.    Appearance: Normal appearance. She is not ill-appearing or diaphoretic.  HENT:     Head: Normocephalic and atraumatic.  Eyes:     General: No scleral icterus.       Right eye: No discharge.        Left eye: No discharge.     Extraocular Movements: Extraocular movements intact.     Conjunctiva/sclera: Conjunctivae normal.  Cardiovascular:     Rate and Rhythm: Regular rhythm. Tachycardia present.     Pulses: Normal pulses.     Heart sounds: Normal heart sounds. No murmur heard.    No friction rub. No gallop.  Pulmonary:     Effort: Pulmonary effort is normal. No respiratory distress.     Breath sounds: No stridor. No wheezing, rhonchi or rales.  Chest:     Chest wall: No tenderness.  Abdominal:     General: Abdomen is flat. There is no distension.     Palpations: Abdomen is soft. There is no pulsatile mass.     Tenderness: There is no abdominal tenderness. There is no right CVA tenderness, left CVA tenderness, guarding or  rebound. Negative signs include Murphy's sign, Rovsing's sign and McBurney's sign.  Musculoskeletal:        General: No swelling, deformity or signs of injury.     Cervical back: Normal range of motion. No rigidity.     Right lower leg: No edema.     Left lower leg: No edema.  Skin:    General: Skin is warm and dry.     Findings: No bruising, erythema or lesion.  Neurological:     General: No focal deficit present.     Mental Status: She is alert and oriented to person, place, and time. Mental status is at baseline.     Sensory: No sensory deficit.     Motor: No weakness.   Psychiatric:        Mood and Affect: Mood normal.     (all labs ordered are listed, but only abnormal results are displayed) Labs Reviewed  CBC WITH DIFFERENTIAL/PLATELET - Abnormal; Notable for the following components:      Result Value   WBC 19.9 (*)    Neutro Abs 17.0 (*)    Monocytes Absolute 1.4 (*)    Abs Immature Granulocytes 0.13 (*)    All other components within normal limits  COMPREHENSIVE METABOLIC PANEL WITH GFR - Abnormal; Notable for the following components:   Glucose, Bld 104 (*)    Creatinine, Ser 1.60 (*)    GFR, Estimated 32 (*)    All other components within normal limits  URINALYSIS, ROUTINE W REFLEX MICROSCOPIC - Abnormal; Notable for the following components:   Hgb urine dipstick LARGE (*)    Protein, ur 30 (*)    Leukocytes,Ua TRACE (*)    All other components within normal limits  LIPASE, BLOOD    EKG: None  Radiology: CT ABDOMEN PELVIS W CONTRAST Result Date: 04/25/2024 CLINICAL DATA:  Right lower quadrant abdominal pain. EXAM: CT ABDOMEN AND PELVIS WITH CONTRAST TECHNIQUE: Multidetector CT imaging of the abdomen and pelvis was performed using the standard protocol following bolus administration of intravenous contrast. RADIATION DOSE REDUCTION: This exam was performed according to the departmental dose-optimization program which includes automated exposure control, adjustment of the mA and/or kV according to patient size and/or use of iterative reconstruction technique. CONTRAST:  70mL OMNIPAQUE IOHEXOL 300 MG/ML  SOLN COMPARISON:  None Available. FINDINGS: Lower chest: The visualized lung bases are clear. No intra-abdominal free air or free fluid. Hepatobiliary: Cirrhosis. No biliary ductal dilatation. The gallbladder is distended. Stones noted within the gallbladder. No pericholecystic fluid or evidence of acute cholecystitis by CT. Pancreas: Unremarkable. No pancreatic ductal dilatation or surrounding inflammatory changes. Spleen: Normal in size  without focal abnormality. Adrenals/Urinary Tract: The adrenal glands unremarkable. There is a 4 mm stone in the distal right ureter with mild right hydronephrosis. There is delayed enhancement and excretion of contrast by the right kidney. There is a 3 mm nonobstructing left renal interpolar calculus. No hydronephrosis on the left. The visualized left ureter and urinary bladder appear unremarkable. Stomach/Bowel: Small hiatal hernia. There is no bowel obstruction or active inflammation. The appendix is not visualized with certainty. No inflammatory changes identified in the right lower quadrant. Vascular/Lymphatic: Mild aortoiliac atherosclerotic disease. The IVC is unremarkable. No portal venous gas. There is no adenopathy. Reproductive: The uterus is anteverted. No suspicious adnexal masses. Other: None Musculoskeletal: No acute or significant osseous findings. IMPRESSION: 1. A 4 mm distal right ureteral stone with mild right hydronephrosis. 2. A 3 mm nonobstructing left renal interpolar calculus. 3. Cirrhosis.  4. Cholelithiasis. 5. No bowel obstruction. 6.  Aortic Atherosclerosis (ICD10-I70.0). Electronically Signed   By: Vanetta Chou M.D.   On: 04/25/2024 18:09   Procedures   Medications Ordered in the ED  fentaNYL (SUBLIMAZE) injection 25 mcg (25 mcg Intravenous Not Given 04/25/24 1620)  tamsulosin (FLOMAX) capsule 0.4 mg (0.4 mg Oral Given 04/25/24 2107)  lactated ringers bolus 500 mL (0 mLs Intravenous Stopped 04/25/24 1742)  iohexol (OMNIPAQUE) 300 MG/ML solution 70 mL (70 mLs Intravenous Contrast Given 04/25/24 1750)  acetaminophen (TYLENOL) tablet 650 mg (650 mg Oral Given 04/25/24 1831)  cefTRIAXone (ROCEPHIN) 1 g in sodium chloride 0.9 % 100 mL IVPB (0 g Intravenous Stopped 04/25/24 2044)  cefTRIAXone (ROCEPHIN) 1 g in sodium chloride 0.9 % 100 mL IVPB (1 g Intravenous New Bag/Given 04/25/24 2050)    Clinical Course as of 04/25/24 2136  Tue Apr 25, 2024  1921 Spoke with Dr. Carolee with  urology, who requested for her to be admitted over at [facility] to medicine, started on Rocephin, with possible stent placement in the morning unless she starts having a fever. [CB]    Clinical Course User Index [CB] Beola Terrall RAMAN, PA-C   Medical Decision Making Amount and/or Complexity of Data Reviewed Labs: ordered. Radiology: ordered.  Risk OTC drugs. Prescription drug management. Decision regarding hospitalization.   This patient is a 78 year old female who presents to the ED for concern of right lower Quadrant Donnell pain that started acutely this a.m. radiating to right flank.  Notes to have not felt like previously, not starting any medications, having 2 normal bowel movements this morning.  On physical exam, patient is in no acute distress, afebrile, alert and orient x 4, speaking in full sentences, nontachypneic.  Notably is mildly tachycardic with HR of low 100s.  LCTAB, no murmur.  No lower leg edema.  Abdomen is nontender to palpation in all 4 quadrants, with no CVA tenderness noted.  Unremarkable exam otherwise.  With patient's current complaints, no abdominal tenderness to palpation, will CT abdomen as well as obtain baseline labs and providing pain medications.  Patient notably SIRS positive on arrival, with elevated white count, pain and stone with urine showing possible UTI, consulted with urology.  Spoke Dr. Carolee with urology who requested patient to be admitted, started Rocephin 2 g, with possible needing stent placement in morning unless she becomes febrile for which she will need to undergo urgent stenting.  She needs for her to be admitted at [facility].  Medications are provided, patient pain currently controlled.  Patient care was then transferred to Dr. Alfornia.  Differential diagnoses prior to evaluation: The emergent differential diagnosis includes, but is not limited to, appendicitis, colitis, enteritis, diverticulitis, cholecystitis, peritonitis, Crohn's  disease, hernia, mesenteric ischemia, pyelonephritis, nephrolithiasis, ovarian cyst, ovarian torsion, PID, AAA,. This is not an exhaustive differential.   Past Medical History / Co-morbidities / Social History: HIV, hepatitis C, Crohn's disease HTN, CKD  Additional history: Chart reviewed. Pertinent results include:   Last seen by ID on 03/16/2024  Lab Tests/Imaging studies: I personally interpreted labs/imaging and the pertinent results include:   CBC notes a neutrophilia without leukocytosis of 19.9 CMP notes stable creatinine and GFR UA does note trace leukocytes as well as a white count of 11-20 with large hemoglobin Lipase unremarkable CT scan does show 4 mm stone on right and ureter, with mild hydronephrosis as well as additionally having a 3 mm nonobstructing stone on left.    I agree with the radiologist  interpretation.  Medications: I ordered medication including Rocephin, Tylenol, LR.  I have reviewed the patients home medicines and have made adjustments as needed.  Critical Interventions: None  Social Determinants of Health: None  Disposition: After consideration of the diagnostic results and the patients response to treatment, I feel that the patient would benefit from admission, with patient care transferred to Dr. Alfornia, with Dr. Carolee pending stenting at 7 AM tomorrow  Final diagnoses:  Ureterolithiasis  SIRS (systemic inflammatory response syndrome) (HCC)  RLQ abdominal pain  Neutrophilia    ED Discharge Orders     None          Beola Terrall GORMAN DEVONNA 04/25/24 2137    Jerrol Agent, MD 04/26/24 1206

## 2024-04-25 NOTE — H&P (Signed)
 History and Physical    Patient: Christine Dixon FMW:[id number] DOB: Jul 25, 1945 DOA: 04/25/2024 DOS: the patient was seen and examined on 04/25/2024 PCP: Juliane Che, PA  Patient coming from: Home  Chief Complaint:  Chief Complaint  Patient presents with   Abdominal Pain   HPI: Christine Dixon is a 78 y.o. female with medical history significant of HIV disease on HAART, sickle cell trait, essential hypertension, chron's disease, chronic kidney disease stage III who presented with pain in the left flank to [facility] room.  Symptoms apparently started early this morning.  Mainly on the right flank.  Associated with some nausea and vomiting.  Also low-grade fever.  Has had no prior history of kidney stones or any complications.  Patient is in the ER.  Found to have a 4 mm kidney stone with hydronephrosis.  Urinalysis also consistent with significant UTI.  With the presence of the kidneys start this is suspected to be complex UTI.  Dr. Carolee of urology was consulted.  He plan to do his stent on the patient tomorrow morning.  Recommends admission to the medical service and they will follow.  Patient is therefore being admitted to medical service with urology follow-up.  Review of Systems: As mentioned in the history of present illness. All other systems reviewed and are negative. Past Medical History:  Diagnosis Date   Chronic kidney disease, stage 3 (HCC)    Crohn's disease (HCC)    HIV infection (HCC)    Hypertension    Sickle cell trait    Past Surgical History:  Procedure Laterality Date   COLON SURGERY     Social History:  reports that she quit smoking about 55 years ago. Her smoking use included cigarettes. She has never used smokeless tobacco. She reports that she does not drink alcohol and does not use drugs.  Allergies  Allergen Reactions   Aspirin     REACTION: due to Crohn's   Codeine     Family History  Problem Relation Age of Onset   Kidney disease Mother     Sickle cell anemia Father    Diabetes Maternal Grandmother    Heart disease Paternal Grandmother     Prior to Admission medications   Medication Sig Start Date End Date Taking? Authorizing Provider  alendronate (FOSAMAX) 70 MG tablet Take 70 mg by mouth once a week. 01/26/22   [provider]  amLODipine  (NORVASC ) 10 MG tablet Take one tablet by mouth daily.  **please contact md for more refills** 12/23/20   Luiz Channel, MD  dolutegravir -rilpivirine  (JULUCA ) 50-25 MG tablet TAKE 1 TABLET BY MOUTH DAILY WITH A MEAL 03/16/24   Luiz Channel, MD  gemfibrozil (LOPID) 600 MG tablet Take 600 mg by mouth 2 (two) times daily. 01/15/22   [provider]  multivitamin-iron-minerals-folic acid (CENTRUM) chewable tablet Chew 1 tablet by mouth daily. Reported on 09/09/2015    [provider]  vitamin B-12 (CYANOCOBALAMIN) 500 MCG tablet Take 500 mcg by mouth daily. 06/05/20   [provider]  Vitamin D , Ergocalciferol , (DRISDOL ) 1.25 MG (50000 UNIT) CAPS capsule Take 50,000 Units by mouth once a week. 06/05/20   [provider]    Physical Exam: Vitals:   04/25/24 1630 04/25/24 1735 04/25/24 1836 04/25/24 2000  BP: (!) 164/85 (!) 169/72  (!) 156/71  Pulse: 85 92  85  Resp: 15   17  Temp:   98.4 F (36.9 C)   SpO2: 100% 100%  100%   Constitutional:  Acutely ill looking, NAD, calm, comfortable Eyes: PERRL, lids and conjunctivae normal ENMT: Mucous membranes are dry posterior pharynx clear of any exudate or lesions.Normal dentition.  Neck: normal, supple, no masses, no thyromegaly Respiratory: clear to auscultation bilaterally, no wheezing, no crackles. Normal respiratory effort. No accessory muscle use.  Cardiovascular: Regular rate and rhythm, no murmurs / rubs / gallops. No extremity edema. 2+ pedal pulses. No carotid bruits.  Abdomen: Right flank tenderness no masses palpated. No hepatosplenomegaly. Bowel sounds positive.  Musculoskeletal: Good range  of motion, no joint swelling or tenderness, Skin: no rashes, lesions, ulcers. No induration Neurologic: CN 2-12 grossly intact. Sensation intact, DTR normal. Strength 5/5 in all 4.  Psychiatric: Normal judgment and insight. Alert and oriented x 3. Normal mood  Data Reviewed:  Temperature 98.4, blood pressure 155/68, pulse 116, respirate of 18 oxygen sats 100% on room air.  White count is 19.9.  Creatinine 1.6 urinalysis showed large leukocytes.  WBC 11-20 RBCs more than 50 and no bacteria seen.  CT abdomen pelvis showed a 4 mm distal right ureteral stone with mild right hydronephrosis.  There is a 3 mm nonobstructing left renal interpolar calculus and cholelithiasis  Assessment and Plan:  #1 sepsis due to UTI: Patient met sepsis criteria on arrival at the ER with tachycardia and leukocytosis.  This in the presence of urinalysis that could mean infection.  At this point patient has been started on antibiotics.  Cultures have been obtained and will follow closely.  #2 complex UTI: In the presence of nephrolithiasis.  Will admit for pain control.  N.p.o. after midnight.  Urology plans stenting in the morning.  Continue IV antibiotics  #3 obstructive uropathy with hydronephrosis: Secondary to a 4 mm nephrolithiasis of the right ureter.  Continue per urology.  Plan for stenting in the morning.  If patient gets sicker it will be done sooner.  #4 HIV disease: Appears to be in remission.  Viral load was nearly undetectable in September.  CD4 cell count 839 with 4% CD4 helper T cells.  Patient is on anti-HIV medicine.  She will follow-up with infectious disease.  #5 history of hepatitis C: Also appears to be in remission.  Continue close monitoring.  Follow-up with GI and infectious disease  #6 chron's disease: Appears to be in remission.  Continue to monitor  #7 essential hypertension: Continue home regimen  #8 generalized anxiety disorder: Stable.  Confirmed on resume home regimen  #9 chronic  kidney disease stage IIIa: Appears to be at baseline.    Advance Care Planning:   Code Status: Full Code   Consults: Dr. Carolee, urologist  Family Communication: No family at bedside  Severity of Illness: The appropriate patient status for this patient is INPATIENT. Inpatient status is judged to be reasonable and necessary in order to provide the required intensity of service to ensure the patient's safety. The patient's presenting symptoms, physical exam findings, and initial radiographic and laboratory data in the context of their chronic comorbidities is felt to place them at high risk for further clinical deterioration. Furthermore, it is not anticipated that the patient will be medically stable for discharge from the hospital within 2 midnights of admission.   * I certify that at the point of admission it is my clinical judgment that the patient will require inpatient hospital care spanning beyond 2 midnights from the point of admission due to high intensity of service, high risk for further deterioration and high frequency of surveillance required.*  Author: SIM KNOLL, MD  04/25/2024 10:32 PM  For on call review [email or web address].

## 2024-04-25 NOTE — ED Triage Notes (Signed)
 C/o RLQ pain starting this morning. Denies n/v/d.

## 2024-04-25 NOTE — Plan of Care (Signed)
 Plan of Care Note for accepted transfer   Patient name: Christine Dixon FMW:[id number] DOB: 1945/09/10  Facility requesting transfer: [facility] Requesting Provider: Beola Terrall GORMAN DEVONNA  Facility course: 78 year old female with history of CKD stage III, Crohn's disease, HIV, hypertension, sickle cell trait, hyperlipidemia, anxiety presenting with acute onset right sided abdominal/flank pain since this morning.  CT showing a 4 mm distal right ureteral stone with mild right hydronephrosis.  Tachycardic on arrival but afebrile.  WBC count 19.9, creatinine 1.6 (previously 1.7 in September 2025 and 1.4 in March 2025).  UA with negative nitrite, trace leukocytes, >50 RBCs, 11-20 WBCs, no bacteria.  Patient was given Tylenol, ceftriaxone, and 500 mL IV fluids.  Tachycardia improved after IV fluids.  No hypotension.  ED PA discussed the case with urologist Dr. Carolee who requested admission to [facility].  Urology planning on placing ureteral stent in the morning unless if patient spikes a fever tonight then stenting will have to be done earlier.  Plan of care: The patient is accepted for admission to Telemetry unit at [facility]  [facility] will assume care on arrival to accepting facility. Until arrival, care as per EDP. However, [facility] available 24/7 for questions and assistance.  Check [email or web address] for on-call coverage.  Nursing staff, please call [facility] Admits & Consults System-Wide number under Amion on patient's arrival so appropriate admitting provider can evaluate the pt.

## 2024-04-26 ENCOUNTER — Observation Stay (HOSPITAL_COMMUNITY): Admitting: Anesthesiology

## 2024-04-26 ENCOUNTER — Encounter (HOSPITAL_COMMUNITY): Payer: Self-pay | Admitting: Internal Medicine

## 2024-04-26 ENCOUNTER — Encounter (HOSPITAL_COMMUNITY): Admission: EM | Disposition: A | Payer: Self-pay | Source: Home / Self Care | Attending: Internal Medicine

## 2024-04-26 ENCOUNTER — Observation Stay (HOSPITAL_COMMUNITY)

## 2024-04-26 DIAGNOSIS — I129 Hypertensive chronic kidney disease with stage 1 through stage 4 chronic kidney disease, or unspecified chronic kidney disease: Secondary | ICD-10-CM | POA: Diagnosis present

## 2024-04-26 DIAGNOSIS — Z886 Allergy status to analgesic agent status: Secondary | ICD-10-CM | POA: Diagnosis not present

## 2024-04-26 DIAGNOSIS — K7460 Unspecified cirrhosis of liver: Secondary | ICD-10-CM | POA: Diagnosis present

## 2024-04-26 DIAGNOSIS — B20 Human immunodeficiency virus [HIV] disease: Secondary | ICD-10-CM | POA: Diagnosis present

## 2024-04-26 DIAGNOSIS — Z833 Family history of diabetes mellitus: Secondary | ICD-10-CM | POA: Diagnosis not present

## 2024-04-26 DIAGNOSIS — Z885 Allergy status to narcotic agent status: Secondary | ICD-10-CM | POA: Diagnosis not present

## 2024-04-26 DIAGNOSIS — N390 Urinary tract infection, site not specified: Secondary | ICD-10-CM | POA: Diagnosis not present

## 2024-04-26 DIAGNOSIS — N1831 Chronic kidney disease, stage 3a: Secondary | ICD-10-CM | POA: Diagnosis present

## 2024-04-26 DIAGNOSIS — B182 Chronic viral hepatitis C: Secondary | ICD-10-CM | POA: Diagnosis present

## 2024-04-26 DIAGNOSIS — Z832 Family history of diseases of the blood and blood-forming organs and certain disorders involving the immune mechanism: Secondary | ICD-10-CM | POA: Diagnosis not present

## 2024-04-26 DIAGNOSIS — F411 Generalized anxiety disorder: Secondary | ICD-10-CM | POA: Diagnosis present

## 2024-04-26 DIAGNOSIS — D573 Sickle-cell trait: Secondary | ICD-10-CM | POA: Diagnosis present

## 2024-04-26 DIAGNOSIS — A419 Sepsis, unspecified organism: Secondary | ICD-10-CM | POA: Diagnosis present

## 2024-04-26 DIAGNOSIS — Z87891 Personal history of nicotine dependence: Secondary | ICD-10-CM | POA: Diagnosis not present

## 2024-04-26 DIAGNOSIS — Z7983 Long term (current) use of bisphosphonates: Secondary | ICD-10-CM | POA: Diagnosis not present

## 2024-04-26 DIAGNOSIS — I700 Atherosclerosis of aorta: Secondary | ICD-10-CM | POA: Diagnosis present

## 2024-04-26 DIAGNOSIS — K5090 Crohn's disease, unspecified, without complications: Secondary | ICD-10-CM | POA: Diagnosis present

## 2024-04-26 DIAGNOSIS — N179 Acute kidney failure, unspecified: Secondary | ICD-10-CM | POA: Diagnosis present

## 2024-04-26 DIAGNOSIS — N136 Pyonephrosis: Secondary | ICD-10-CM | POA: Diagnosis present

## 2024-04-26 DIAGNOSIS — N201 Calculus of ureter: Secondary | ICD-10-CM

## 2024-04-26 DIAGNOSIS — Z79899 Other long term (current) drug therapy: Secondary | ICD-10-CM | POA: Diagnosis not present

## 2024-04-26 DIAGNOSIS — Z87442 Personal history of urinary calculi: Secondary | ICD-10-CM | POA: Diagnosis not present

## 2024-04-26 DIAGNOSIS — D631 Anemia in chronic kidney disease: Secondary | ICD-10-CM | POA: Diagnosis present

## 2024-04-26 DIAGNOSIS — K8020 Calculus of gallbladder without cholecystitis without obstruction: Secondary | ICD-10-CM | POA: Diagnosis present

## 2024-04-26 DIAGNOSIS — Z8249 Family history of ischemic heart disease and other diseases of the circulatory system: Secondary | ICD-10-CM | POA: Diagnosis not present

## 2024-04-26 LAB — COMPREHENSIVE METABOLIC PANEL WITH GFR
ALT: 17 U/L (ref 0–44)
AST: 24 U/L (ref 15–41)
Albumin: 4.3 g/dL (ref 3.5–5.0)
Alkaline Phosphatase: 48 U/L (ref 38–126)
Anion gap: 13 (ref 5–15)
BUN: 14 mg/dL (ref 8–23)
CO2: 22 mmol/L (ref 22–32)
Calcium: 8.8 mg/dL — ABNORMAL LOW (ref 8.9–10.3)
Chloride: 106 mmol/L (ref 98–111)
Creatinine, Ser: 1.44 mg/dL — ABNORMAL HIGH (ref 0.44–1.00)
GFR, Estimated: 37 mL/min — ABNORMAL LOW (ref >60)
Glucose, Bld: 117 mg/dL — ABNORMAL HIGH (ref 70–99)
Potassium: 3.7 mmol/L (ref 3.5–5.1)
Sodium: 141 mmol/L (ref 135–145)
Total Bilirubin: 0.4 mg/dL (ref 0.0–1.2)
Total Protein: 7.2 g/dL (ref 6.5–8.1)

## 2024-04-26 LAB — CBC
HCT: 36.8 % (ref 36.0–46.0)
Hemoglobin: 11.4 g/dL — ABNORMAL LOW (ref 12.0–15.0)
MCH: 26.4 pg (ref 26.0–34.0)
MCHC: 31 g/dL (ref 30.0–36.0)
MCV: 85.2 fL (ref 80.0–100.0)
Platelets: 302 K/uL (ref 150–400)
RBC: 4.32 MIL/uL (ref 3.87–5.11)
RDW: 13.5 % (ref 11.5–15.5)
WBC: 13.2 K/uL — ABNORMAL HIGH (ref 4.0–10.5)
nRBC: 0 % (ref 0.0–0.2)

## 2024-04-26 LAB — SURGICAL PCR SCREEN
MRSA, PCR: NEGATIVE
Staphylococcus aureus: NEGATIVE

## 2024-04-26 SURGERY — CYSTOSCOPY, WITH STENT INSERTION
Anesthesia: General | Laterality: Right

## 2024-04-26 MED ORDER — ONDANSETRON HCL 4 MG/2ML IJ SOLN
INTRAMUSCULAR | Status: AC
  Filled 2024-04-26: qty 2

## 2024-04-26 MED ORDER — PROPOFOL 10 MG/ML IV BOLUS
INTRAVENOUS | Status: DC | PRN
  Administered 2024-04-26: 100 mg via INTRAVENOUS

## 2024-04-26 MED ORDER — PHENAZOPYRIDINE HCL 100 MG PO TABS
100.0000 mg | ORAL_TABLET | Freq: Every day | ORAL | Status: DC | PRN
  Administered 2024-04-26: 100 mg via ORAL
  Filled 2024-04-26 (×2): qty 1

## 2024-04-26 MED ORDER — IOHEXOL 300 MG/ML  SOLN
INTRAMUSCULAR | Status: DC | PRN
  Administered 2024-04-26: 10 mL

## 2024-04-26 MED ORDER — AMISULPRIDE (ANTIEMETIC) 5 MG/2ML IV SOLN
10.0000 mg | Freq: Once | INTRAVENOUS | Status: DC | PRN
Start: 2024-04-26 — End: 2024-04-26

## 2024-04-26 MED ORDER — 0.9 % SODIUM CHLORIDE (POUR BTL) OPTIME
TOPICAL | Status: DC | PRN
  Administered 2024-04-26: 1000 mL

## 2024-04-26 MED ORDER — STERILE WATER FOR IRRIGATION IR SOLN
Status: DC | PRN
  Administered 2024-04-26: 3000 mL

## 2024-04-26 MED ORDER — PHENAZOPYRIDINE HCL 100 MG PO TABS: 100.0000 mg | ORAL_TABLET | Freq: Three times a day (TID) | ORAL | Status: DC | PRN

## 2024-04-26 MED ORDER — CHLORHEXIDINE GLUCONATE 0.12 % MT SOLN: 15.0000 mL | Freq: Once | OROMUCOSAL | Status: AC

## 2024-04-26 MED ORDER — LIDOCAINE HCL (CARDIAC) PF 100 MG/5ML IV SOSY
PREFILLED_SYRINGE | INTRAVENOUS | Status: DC | PRN
  Administered 2024-04-26: 40 mg via INTRAVENOUS

## 2024-04-26 MED ORDER — PHENYLEPHRINE HCL (PRESSORS) 10 MG/ML IV SOLN
INTRAVENOUS | Status: DC | PRN
  Administered 2024-04-26: 160 ug via INTRAVENOUS
  Administered 2024-04-26: 80 ug via INTRAVENOUS
  Administered 2024-04-26: 160 ug via INTRAVENOUS
  Administered 2024-04-26 (×2): 80 ug via INTRAVENOUS

## 2024-04-26 MED ORDER — LIDOCAINE HCL (PF) 2 % IJ SOLN
INTRAMUSCULAR | Status: AC
  Filled 2024-04-26: qty 5

## 2024-04-26 MED ORDER — ONDANSETRON HCL 4 MG/2ML IJ SOLN
INTRAMUSCULAR | Status: DC | PRN
  Administered 2024-04-26: 4 mg via INTRAVENOUS

## 2024-04-26 MED ORDER — PHENYLEPHRINE 80 MCG/ML (10ML) SYRINGE FOR IV PUSH (FOR BLOOD PRESSURE SUPPORT)
PREFILLED_SYRINGE | INTRAVENOUS | Status: AC
  Filled 2024-04-26: qty 10

## 2024-04-26 MED ORDER — DEXAMETHASONE SODIUM PHOSPHATE 4 MG/ML IJ SOLN
INTRAMUSCULAR | Status: DC | PRN
  Administered 2024-04-26: 4 mg via INTRAVENOUS

## 2024-04-26 MED ORDER — ORAL CARE MOUTH RINSE
15.0000 mL | Freq: Once | OROMUCOSAL | Status: AC
  Administered 2024-04-26: 15 mL via OROMUCOSAL

## 2024-04-26 MED ORDER — PROPOFOL 10 MG/ML IV BOLUS
INTRAVENOUS | Status: AC
  Filled 2024-04-26: qty 20

## 2024-04-26 MED ORDER — FENTANYL CITRATE (PF) 100 MCG/2ML IJ SOLN
INTRAMUSCULAR | Status: DC | PRN
  Administered 2024-04-26: 25 ug via INTRAVENOUS
  Administered 2024-04-26: 50 ug via INTRAVENOUS
  Administered 2024-04-26: 25 ug via INTRAVENOUS

## 2024-04-26 MED ORDER — FENTANYL CITRATE (PF) 50 MCG/ML IJ SOSY: 25.0000 ug | PREFILLED_SYRINGE | INTRAMUSCULAR | Status: DC | PRN

## 2024-04-26 MED ORDER — FENTANYL CITRATE (PF) 100 MCG/2ML IJ SOLN
INTRAMUSCULAR | Status: AC
  Filled 2024-04-26: qty 2

## 2024-04-26 MED ORDER — LACTATED RINGERS IV SOLN: INTRAVENOUS | Status: DC

## 2024-04-26 SURGICAL SUPPLY — 12 items
BAG URO CATCHER STRL LF (MISCELLANEOUS) ×2 IMPLANT
BASKET ZERO TIP NITINOL 2.4FR (BASKET) IMPLANT
CATH URETL OPEN END 6FR 70 (CATHETERS) ×2 IMPLANT
CLOTH BEACON ORANGE TIMEOUT ST (SAFETY) ×2 IMPLANT
GLOVE BIO SURGEON STRL SZ7.5 (GLOVE) ×2 IMPLANT
GOWN STRL REUS W/ TWL XL LVL3 (GOWN DISPOSABLE) ×2 IMPLANT
GUIDEWIRE STR DUAL SENSOR (WIRE) ×2 IMPLANT
KIT TURNOVER KIT A (KITS) ×2 IMPLANT
MANIFOLD NEPTUNE II (INSTRUMENTS) ×2 IMPLANT
PACK CYSTO (CUSTOM PROCEDURE TRAY) ×2 IMPLANT
STENT URET 6FRX24 CONTOUR (STENTS) IMPLANT
TUBING CONNECTING 10 (TUBING) ×2 IMPLANT

## 2024-04-26 NOTE — Op Note (Signed)
 Preoperative diagnosis: right ureteral calculus  Postoperative diagnosis: right ureteral calculus  Procedure:  Cystoscopy right ureteroscopy and stone removal Ureteroscopic laser lithotripsy right 52F x 24 ureteral stent placement  right retrograde pyelography with interpretation  Surgeon: Dr. Steffan Pea  Anesthesia: General  Complications: None  Intraoperative findings: Right ureteral stone dusted and fragments removed.  [id number] JJ stent with strings left in place.  EBL: Minimal  Specimens: right ureteral calculus  Disposition of specimens: [facility] for stone analysis  Indication: PIA Christine Dixon is a 78 y.o.   patient with a  right ureteral stone and associated right symptoms. After reviewing the management options for treatment, the patient elected to proceed with the above surgical procedure(s). We have discussed the potential benefits and risks of the procedure, side effects of the proposed treatment, the likelihood of the patient achieving the goals of the procedure, and any potential problems that might occur during the procedure or recuperation. Informed consent has been obtained.   Description of procedure:  The patient was taken to the operating room and general anesthesia was induced.  The patient was placed in the dorsal lithotomy position, prepped and draped in the usual sterile fashion, and preoperative antibiotics were administered. A preoperative time-out was performed.   Cystourethroscopy was performed.  The patient's urethra was examined and was normal.  Pan cystoscopy was performed and the bladder systematically examined in its entirety. There was no evidence for any bladder tumors, stones, or other mucosal pathology.      A 0.38 sensor guidewire was then advanced up the right ureter into the renal pelvis under fluoroscopic guidance. The 4.5 Fr semirigid ureteroscope was then advanced into the ureter next to the guidewire and the calculus  was identified.   The stone was then fragmented with the 200 micron holmium laser fiber on a setting of 0.6 and frequency of 6 Hz.   All stones were then removed from the ureter with an N-gage nitinol basket.  Reinspection of the ureter revealed no remaining visible stones or fragments.   A Retrograde pyelogram was then performed showing no abnormalities. No filling defects, or contrast extravasation.   The stent was then back loaded on the wire and the stent was placed under fluoroscopic guidance. Proper placement was confirmed with flouro.   The bladder was then emptied and the procedure ended.  The patient appeared to tolerate the procedure well and without complications.  The patient was able to be awakened and transferred to the recovery unit in satisfactory condition.   Disposition: The tether of the stent was left on and taped to the mons pubis.  Instructions for removing the stent have been provided to the patient.

## 2024-04-26 NOTE — Anesthesia Preprocedure Evaluation (Signed)
 Anesthesia Evaluation  Patient identified by MRN, date of birth, ID band Patient awake    Reviewed: Allergy & Precautions, NPO status , Patient's Chart, lab work & pertinent test results  Airway Mallampati: II  TM Distance: >3 FB Neck ROM: Full    Dental   Pulmonary former smoker   Pulmonary exam normal        Cardiovascular hypertension, Pt. on medications Normal cardiovascular exam     Neuro/Psych negative neurological ROS     GI/Hepatic negative GI ROS, Neg liver ROS,,,  Endo/Other  negative endocrine ROS    Renal/GU CRFRenal disease     Musculoskeletal   Abdominal   Peds  Hematology  (+) Blood dyscrasia, Sickle cell trait and anemia , HIV  Anesthesia Other Findings   Reproductive/Obstetrics                              Anesthesia Physical Anesthesia Plan  ASA: 3  Anesthesia Plan: General   Post-op Pain Management: Minimal or no pain anticipated   Induction: Intravenous  PONV Risk Score and Plan: 3 and Dexamethasone and Ondansetron  Airway Management Planned: LMA  Additional Equipment: None  Intra-op Plan:   Post-operative Plan: Extubation in OR  Informed Consent: I have reviewed the patients History and Physical, chart, labs and discussed the procedure including the risks, benefits and alternatives for the proposed anesthesia with the patient or authorized representative who has indicated his/her understanding and acceptance.     Dental advisory given  Plan Discussed with: CRNA  Anesthesia Plan Comments:         Anesthesia Quick Evaluation

## 2024-04-26 NOTE — Progress Notes (Signed)
   04/26/24 1140  TOC Brief Assessment  Insurance and Status Reviewed  Patient has primary care physician Yes  Home environment has been reviewed Apartment  Prior level of function: Independent  Prior/Current Home Services No current home services  Social Drivers of Health Review SDOH reviewed no interventions necessary  Readmission risk has been reviewed Yes  Transition of care needs no transition of care needs at this time    Signed: Heather Saltness, MSW, LCSW Clinical Social Worker Inpatient Care Management 04/26/2024 11:40 AM

## 2024-04-26 NOTE — Plan of Care (Signed)

## 2024-04-26 NOTE — Consult Note (Signed)
 I have been asked to see the patient by Dr. Terrall First, for evaluation and management of right ureteral stone.  History of present illness: 78 year old Dixon presented to the ER today right lower quadrant pain CT demonstrated a 4 mm distal right ureteral stone.  Patient started having pain yesterday morning and his pain did not improve.  She denies any nausea vomiting fevers or chills.  PMH includes HIV, chronic hepatitis C, Crohn's disease, hypertension, CKD  Labs demonstrate white count of 19.9 creatinine 1.6 this is at baseline.  Heart rate slightly tachycardic to 107.   Review of systems: A 12 point comprehensive review of systems was obtained and is negative unless otherwise stated in the history of present illness.  Patient Active Problem List   Diagnosis Date Noted   Complicated UTI (urinary tract infection) 04/25/2024   Nephrolithiasis 04/25/2024   Chronic kidney disease, stage 3a (HCC) 06/04/2020   GAD (generalized anxiety disorder) 12/29/2018   Panic disorder 10/05/2018   Essential hypertension 12/16/2017   Weakness 10/16/2017   Atypical chest pain 10/16/2017   Palpitations 10/16/2017   Tachycardia 10/16/2017   Papanicolaou smear of cervix with low grade squamous intraepithelial lesion (LGSIL) 10/08/2012   CROHN'S DISEASE, SMALL INTESTINE 03/25/2010   Crohn's disease, small intestine (HCC) 03/25/2010   HIV DISEASE 07/22/2006   HEPATITIS C 07/22/2006   ANEMIA OF CHRONIC DISEASE 07/22/2006   DYSPLASIA, CERVIX, MILD 07/22/2006   DYSPLASIA, VAGINA 07/22/2006   Osteoporosis 07/22/2006   Dysplasia, 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   Current Outpatient Medications on File Prior to Encounter  Medication Sig Dispense Refill   alendronate (FOSAMAX) 70 MG tablet Take 70 mg by mouth once a week.     amLODipine   (NORVASC ) 10 MG tablet Take one tablet by mouth daily.  **please contact md for more refills** 30 tablet 0   dolutegravir -rilpivirine  (JULUCA ) 50-25 MG tablet TAKE 1 TABLET BY MOUTH DAILY WITH A MEAL 90 tablet 3   gemfibrozil (LOPID) 600 MG tablet Take 600 mg by mouth 2 (two) times daily.     multivitamin-iron-minerals-folic acid (CENTRUM) chewable tablet Chew 1 tablet by mouth daily. Reported on 09/09/2015     vitamin B-12 (CYANOCOBALAMIN) 500 MCG tablet Take 500 mcg by mouth daily.     Vitamin D , Ergocalciferol , (DRISDOL ) 1.25 MG (50000 UNIT) CAPS capsule Take 50,000 Units by mouth once a week.      Past Medical History:  Diagnosis Date   Chronic kidney disease, stage 3 (HCC)    Crohn's disease (HCC)    HIV infection (HCC)    Hypertension    Sickle cell trait     Past Surgical History:  Procedure Laterality Date   COLON SURGERY      Social History   Tobacco Use   Smoking status: Former    Current packs/day: 0.00    Types: Cigarettes    Quit date: 06/29/1968    Years since quitting: 55.8   Smokeless tobacco: Never  Vaping Use   Vaping status: Never Used  Substance Use Topics   Alcohol use: No   Drug use: No    Family History  Problem Relation Age of Onset   Kidney disease Mother    Sickle cell anemia Father    Diabetes Maternal Grandmother    Heart disease Paternal Grandmother     PE: Vitals:  04/25/24 2233 04/26/24 0229 04/26/24 0603 04/26/24 0604  BP: (!) 155/68 132/66  (!) 145/67  Pulse: 84 88  (!) 107  Resp: 18 18  18   Temp: 98.3 F (36.8 C) 98.4 F (36.9 C)  98.8 F (37.1 C)  TempSrc:    Oral  SpO2: 100% 96%  97%  Weight:   44.5 kg   Height:   4' 11 (1.499 m)    General: No acute distress Respiratory: Normal work of breathing room air Cardiovascular: Slightly tachycardic on monitor.  Recent Labs    04/25/24 1618  WBC 19.9*  HGB 12.8  HCT 39.4   Recent Labs    04/25/24 1618  NA 140  K 3.8  CL 104  CO2 22  GLUCOSE 104*  BUN 18   CREATININE 1.60*  CALCIUM 9.7   No results for input(s): LABPT, INR in the last 72 hours. No results for input(s): LABURIN in the last 72 hours. Results for orders placed or performed during the hospital encounter of 04/25/24  Surgical PCR screen     Status: None   Collection Time: 04/25/24 10:44 PM   Specimen: Nasal Mucosa; Nasal Swab  Result Value Ref Range Status   MRSA, PCR NEGATIVE NEGATIVE Final   Staphylococcus aureus NEGATIVE NEGATIVE Final    Comment: (NOTE) The Xpert SA Assay (FDA approved for NASAL specimens in patients 72 years of age and older), is one component of a comprehensive surveillance program. It is not intended to diagnose infection nor to guide or monitor treatment. Performed at [facility], [address], [place], [place] [address]     Imaging: IMPRESSION: 1. A 4 mm distal right ureteral stone with mild right hydronephrosis. 2. A 3 mm nonobstructing left renal interpolar calculus. 3. Cirrhosis. 4. Cholelithiasis. 5. No bowel obstruction. 6.  Aortic Atherosclerosis (ICD10-I70.0).  Imp: 78 year old Dixon with a right distal ureteral stone no concerns of infection.  We discussed risk benefits alternatives to ureteroscopy.  This included bleeding infection and damage to surrounding structures surrounding structures including ureter as well as urethra.  We discussed the need for stent postoperatively as well as the potential symptoms of stent placement.  We discussed possible inability to complete procedure due to caliber of your ureter or inability to pass stone possibly requiring long-term stent versus nephrostomy tube.  We discussed need for possible second surgery.  Patient voiced their understanding and consent was obtained.   Recommendations: Keep n.p.o. will proceed with right ureteroscopy with laser lithotripsy.   Thank you for involving me in this patient's care, I will continue to follow along.Please page with any  further questions or concerns. Steffan JAYSON Pea

## 2024-04-26 NOTE — Progress Notes (Signed)
 PROGRESS NOTE  Christine Dixon  FMW:[id number] DOB: 11/07/1945 DOA: 04/25/2024 PCP: Juliane Che, PA   Brief Narrative: Patient is a 78 year old female with history of HIV disease on HAART, sickle cell trait, hypertension, Crohn's disease, CKD stage IIIa who presented with left flank pain, nausea, vomiting, low-grade fever.  Imaging showed 4 millimeter kidney stone on the right side with hydronephrosis.  UA suspicious for UTI.  Urology consulted.  Started on broad antibiotics.  Urine culture sent.  Status post cystoscopy, right ureteroscopy and stone removal, ureteroscopy laser lithotripsy and right ureteral stent placement on 10/29.  Assessment & Plan:  Principal Problem:   Complicated UTI (urinary tract infection) Active Problems:   HIV DISEASE   HEPATITIS C   Essential hypertension   Chronic kidney disease, stage 3a (HCC)   GAD (generalized anxiety disorder)   Crohn's disease, small intestine (HCC)   Nephrolithiasis   Sepsis secondary to UTI: Met sepsis criteria with tachycardia, leukocytosis on presentation.  Currently on ceftriaxone.  Looks like urine culture has not been sent.  Will send it.  Currently afebrile.  Presented with leukocytosis.  Will check CBC  Right ureteral stone: Imaging showed 4 millimeter kidney stone on the right side with hydronephrosis. Urology consulted.  Started on broad antibiotics.  Urine culture sent.  Status post cystoscopy, right ureteroscopy and stone removal, ureteroscopy laser lithotripsy and right ureteral stent placement on 10/29.  HIV disease: Currently in remission.  Viral load was undetectable in September.CD4 cell count 839 with 4% CD4 helper T cells. Patient is on anti-HIV medicine. She will follow-up with infectious disease.   History of hepatitis: Also in remission  Crohn's disease: Appears in remission, continue to monitor  Hypertension: Currently blood stable.  Continue home regimen  Generalized anxiety disorder: Currently stable.   Continue home regimen  CKD stage IIIa: Baseline creatinine around 1.4.  Presented with mild AKI of 1.6.  Continue to monitor        DVT prophylaxis:SCDs Start: 04/25/24 2232     Code Status: Full Code  Family Communication: None at bedside  Patient status:Inpatient  Patient is from :home  Anticipated discharge un:ynfz  Estimated DC date:tomorrow   Consultants: Urology  Procedures:Stent placement  Antimicrobials:  Anti-infectives (From admission, onward)    Start     Dose/Rate Route Frequency Ordered Stop   04/26/24 1800  dolutegravir  (TIVICAY ) tablet 50 mg       Placed in And Linked Group   50 mg Oral Daily with lunch 04/25/24 2247     04/26/24 1200  dolutegravir -rilpivirine  (JULUCA ) 50-25 MG per tablet 1 tablet  Status:  Discontinued        1 tablet Oral Daily with lunch 04/25/24 2232 04/25/24 2245   04/26/24 1200  rilpivirine  (EDURANT) tablet 25 mg       Placed in And Linked Group   25 mg Oral Daily with lunch 04/25/24 2247     04/26/24 1000  cefTRIAXone (ROCEPHIN) 1 g in sodium chloride 0.9 % 100 mL IVPB        1 g 200 mL/hr over 30 Minutes Intravenous Every 24 hours 04/25/24 2232     04/25/24 2045  cefTRIAXone (ROCEPHIN) 1 g in sodium chloride 0.9 % 100 mL IVPB        1 g 200 mL/hr over 30 Minutes Intravenous  Once 04/25/24 2041 04/25/24 2153   04/25/24 1945  cefTRIAXone (ROCEPHIN) 1 g in sodium chloride 0.9 % 100 mL IVPB        1  g 200 mL/hr over 30 Minutes Intravenous  Once 04/25/24 1930 04/25/24 2044   04/25/24 1930  cefTRIAXone (ROCEPHIN) 2 g in sodium chloride 0.9 % 100 mL IVPB  Status:  Discontinued        2 g 200 mL/hr over 30 Minutes Intravenous  Once 04/25/24 1923 04/25/24 1930       Subjective: Patient seen and examined at bedside today.  Came from stent placement.  Currently comfortable.  Denies any abdominal pain, nausea or vomiting.  Objective: Vitals:   04/26/24 0815 04/26/24 0822 04/26/24 0830 04/26/24 0851  BP: 117/60  (!) 106/55  (!) 132/58  Pulse: 88 79 75 78  Resp: 16 18 20 17   Temp:   98 F (36.7 C) 98.4 F (36.9 C)  TempSrc:      SpO2: 100% 100% 100% 100%  Weight:      Height:        Intake/Output Summary (Last 24 hours) at 04/26/2024 0940 Last data filed at 04/26/2024 0744 Gross per 24 hour  Intake 340 ml  Output 200 ml  Net 140 ml   Filed Weights   04/26/24 0603  Weight: 44.5 kg    Examination:  General exam: Overall comfortable, not in distress HEENT: PERRL Respiratory system:  no wheezes or crackles  Cardiovascular system: S1 & S2 heard, RRR.  Gastrointestinal system: Abdomen is nondistended, soft and nontender. Central nervous system: Alert and oriented Extremities: No edema, no clubbing ,no cyanosis Skin: No rashes, no ulcers,no icterus     Data Reviewed: I have personally reviewed following labs and imaging studies  CBC: Recent Labs  Lab 04/25/24 1618  WBC 19.9*  NEUTROABS 17.0*  HGB 12.8  HCT 39.4  MCV 83.8  PLT 326   Basic Metabolic Panel: Recent Labs  Lab 04/25/24 1618  NA 140  K 3.8  CL 104  CO2 22  GLUCOSE 104*  BUN 18  CREATININE 1.60*  CALCIUM 9.7     Recent Results (from the past 240 hours)  Surgical PCR screen     Status: None   Collection Time: 04/25/24 10:44 PM   Specimen: Nasal Mucosa; Nasal Swab  Result Value Ref Range Status   MRSA, PCR NEGATIVE NEGATIVE Final   Staphylococcus aureus NEGATIVE NEGATIVE Final    Comment: (NOTE) The Xpert SA Assay (FDA approved for NASAL specimens in patients 14 years of age and older), is one component of a comprehensive surveillance program. It is not intended to diagnose infection nor to guide or monitor treatment. Performed at [facility], [address], [place], [place] [address]      Radiology Studies: DG C-Arm 1-60 Min-No Report Result Date: 04/26/2024 Fluoroscopy was utilized by the requesting physician.  No radiographic interpretation.   CT ABDOMEN PELVIS W  CONTRAST Result Date: 04/25/2024 CLINICAL DATA:  Right lower quadrant abdominal pain. EXAM: CT ABDOMEN AND PELVIS WITH CONTRAST TECHNIQUE: Multidetector CT imaging of the abdomen and pelvis was performed using the standard protocol following bolus administration of intravenous contrast. RADIATION DOSE REDUCTION: This exam was performed according to the departmental dose-optimization program which includes automated exposure control, adjustment of the mA and/or kV according to patient size and/or use of iterative reconstruction technique. CONTRAST:  70mL OMNIPAQUE IOHEXOL 300 MG/ML  SOLN COMPARISON:  None Available. FINDINGS: Lower chest: The visualized lung bases are clear. No intra-abdominal free air or free fluid. Hepatobiliary: Cirrhosis. No biliary ductal dilatation. The gallbladder is distended. Stones noted within the gallbladder. No pericholecystic fluid or evidence  of acute cholecystitis by CT. Pancreas: Unremarkable. No pancreatic ductal dilatation or surrounding inflammatory changes. Spleen: Normal in size without focal abnormality. Adrenals/Urinary Tract: The adrenal glands unremarkable. There is a 4 mm stone in the distal right ureter with mild right hydronephrosis. There is delayed enhancement and excretion of contrast by the right kidney. There is a 3 mm nonobstructing left renal interpolar calculus. No hydronephrosis on the left. The visualized left ureter and urinary bladder appear unremarkable. Stomach/Bowel: Small hiatal hernia. There is no bowel obstruction or active inflammation. The appendix is not visualized with certainty. No inflammatory changes identified in the right lower quadrant. Vascular/Lymphatic: Mild aortoiliac atherosclerotic disease. The IVC is unremarkable. No portal venous gas. There is no adenopathy. Reproductive: The uterus is anteverted. No suspicious adnexal masses. Other: None Musculoskeletal: No acute or significant osseous findings. IMPRESSION: 1. A 4 mm distal right  ureteral stone with mild right hydronephrosis. 2. A 3 mm nonobstructing left renal interpolar calculus. 3. Cirrhosis. 4. Cholelithiasis. 5. No bowel obstruction. 6.  Aortic Atherosclerosis (ICD10-I70.0). Electronically Signed   By: Vanetta Chou M.D.   On: 04/25/2024 18:09    Scheduled Meds:  amLODipine   10 mg Oral Daily   cyanocobalamin  500 mcg Oral Daily   dolutegravir   50 mg Oral Q lunch   And   rilpivirine   25 mg Oral Q lunch   fentaNYL (SUBLIMAZE) injection  25 mcg Intravenous Once   gemfibrozil  600 mg Oral BID   multivitamin with minerals  1 tablet Oral Daily   tamsulosin  0.4 mg Oral Daily   Vitamin D  (Ergocalciferol )  50,000 Units Oral Weekly   Continuous Infusions:  cefTRIAXone (ROCEPHIN)  IV       LOS: 0 days   Ivonne Mustache, MD [facility] P10/29/2025, 9:40 AM

## 2024-04-26 NOTE — Transfer of Care (Signed)
 Immediate Anesthesia Transfer of Care Note  Patient: Christine Dixon  Procedure(s) Performed: CYSTOSCOPY, URETEROSCOPY, RETROGRADE PYELOGRAM, STENT INSERTION (Right)  Patient Location: PACU  Anesthesia Type:General  Level of Consciousness: awake, alert , and oriented  Airway & Oxygen Therapy: Patient Spontanous Breathing and Patient connected to face mask oxygen  Post-op Assessment: Report given to RN and Post -op Vital signs reviewed and stable  Post vital signs: Reviewed and stable  Last Vitals:  Vitals Value Taken Time  BP 118/82 04/26/24 08:00  Temp    Pulse 87 04/26/24 08:01  Resp 13 04/26/24 08:01  SpO2 98 % 04/26/24 08:01  Vitals shown include unfiled device data.  Last Pain:  Vitals:   04/26/24 0604  TempSrc: Oral  PainSc:       Patients Stated Pain Goal: 3 (04/26/24 0603)  Complications: No notable events documented.

## 2024-04-26 NOTE — Discharge Instructions (Signed)
 DISCHARGE INSTRUCTIONS FOR Ureteroscopy and/or Ureteral Stent Placement  MEDICATIONS:  1.  Robaxin 2. Tamsulosin  3. Hyoscyamine   ACTIVITY:  1. No strenuous activity x 1week  2. No driving while on narcotic pain medications  3. Drink plenty of water  4. Continue to walk at home - it is normal to see blood in the urine while the stent is in place, so keep active, but don't over do it.  5. May return to work/school tomorrow or when you feel ready  6. You may experience some pain when urinating in the kidney on the side that was operated on while the stent is in place this is normal  WHAT IS NORMAL TO EXPERIENCE: It is normal to feel the urge to urinate while the stent is in place It is normal to have blood in your urine while the stent is in place  It sometimes can be normal to have pain in your kidney when you urinate   BATHING:  1. You can shower and we recommend daily showers  2. You have a string coming from your urethra: The stent string is attached to your ureteral stent. Do not pull on this until instructed.  DIET: You may return to your normal diet immediately. Because of the raw surface of your bladder, alcohol, spicy foods, acid type foods and drinks with caffeine may cause irritation or frequency and should be used in moderation. To keep your urine flowing freely and to avoid constipation, drink plenty of fluids during the day ( 8-10 glasses ). Tip: Avoid cranberry juice because it is very acidic.  SIGNS/SYMPTOMS TO CALL:  Please call us  if you have a fever greater than 101.5, uncontrolled nausea/vomiting, uncontrolled pain, dizziness, unable to urinate, bloody urine with clots greater than the size of a quarter, chest pain, shortness of breath, leg swelling, leg pain, redness around wound, drainage from wound, or any other concerns or questions.   You can reach us  at [phone].   FOLLOW-UP:  1. You may remove your stent in on Monday November 3rd . To do this go into the  shower, grab hold of the tether coming from your urethra. Pull the tether consistent motion until the stent is removed from your body. The stent will be around 10 inches long with a curl on either end.  2. You you have been set up for f/u in 6-8 weeks

## 2024-04-26 NOTE — Progress Notes (Signed)
 Patient is requesting to speak with the urologist prior to signing the consent form for the cystoscopy scheduled for today.  RN notified PACU of the above information.   Sent patient's 6 rings, 1 necklace and 1 bracelet to security. Rn transported the belonging with Engineer, materials and watched the items get locked in a safe box. Key brought back to place in patient's chart on the unit. Placed key in [place] envelope and sealed it. Placed in patient's chart spot in chart cabinet.

## 2024-04-26 NOTE — Plan of Care (Signed)
  Problem: Clinical Measurements: Goal: Respiratory complications will improve Outcome: Progressing Goal: Cardiovascular complication will be avoided Outcome: Progressing   Problem: Activity: Goal: Risk for activity intolerance will decrease Outcome: Progressing   Problem: Coping: Goal: Level of anxiety will decrease Outcome: Progressing   Problem: Elimination: Goal: Will not experience complications related to urinary retention Outcome: Progressing   Problem: Pain Managment: Goal: General experience of comfort will improve and/or be controlled Outcome: Progressing   Problem: Safety: Goal: Ability to remain free from injury will improve Outcome: Progressing

## 2024-04-26 NOTE — Anesthesia Procedure Notes (Signed)
 Procedure Name: LMA Insertion Date/Time: 04/26/2024 7:14 AM  Performed by: Buster Catheryn SAUNDERS, CRNAPre-anesthesia Checklist: Patient identified, Emergency Drugs available, Suction available and Patient being monitored Patient Re-evaluated:Patient Re-evaluated prior to induction Oxygen Delivery Method: Circle system utilized Preoxygenation: Pre-oxygenation with 100% oxygen Induction Type: IV induction Ventilation: Mask ventilation without difficulty LMA: LMA inserted LMA Size: 3.0 Number of attempts: 1 Placement Confirmation: positive ETCO2 Tube secured with: Tape Dental Injury: Teeth and Oropharynx as per pre-operative assessment

## 2024-04-27 ENCOUNTER — Encounter (HOSPITAL_COMMUNITY): Payer: Self-pay | Admitting: Urology

## 2024-04-27 ENCOUNTER — Other Ambulatory Visit (HOSPITAL_COMMUNITY): Payer: Self-pay

## 2024-04-27 DIAGNOSIS — N390 Urinary tract infection, site not specified: Secondary | ICD-10-CM | POA: Diagnosis not present

## 2024-04-27 LAB — URINE CULTURE: Culture: NO GROWTH

## 2024-04-27 MED ORDER — CEFADROXIL 500 MG PO CAPS
500.0000 mg | ORAL_CAPSULE | Freq: Two times a day (BID) | ORAL | Status: DC
Start: 2024-04-28 — End: 2024-04-27

## 2024-04-27 MED ORDER — CEFADROXIL 500 MG PO CAPS
500.0000 mg | ORAL_CAPSULE | Freq: Two times a day (BID) | ORAL | 0 refills | Status: AC
  Filled 2024-04-27: qty 4, 2d supply, fill #0

## 2024-04-27 MED ORDER — TAMSULOSIN HCL 0.4 MG PO CAPS
0.4000 mg | ORAL_CAPSULE | Freq: Every day | ORAL | 0 refills | Status: AC
  Filled 2024-04-27 (×2): qty 60, 60d supply, fill #0

## 2024-04-27 MED ORDER — CEFADROXIL 500 MG PO CAPS
1000.0000 mg | ORAL_CAPSULE | Freq: Two times a day (BID) | ORAL | 0 refills | Status: DC
  Filled 2024-04-27: qty 8, 2d supply, fill #0

## 2024-04-27 MED ORDER — CEFADROXIL 500 MG PO CAPS
1000.0000 mg | ORAL_CAPSULE | Freq: Two times a day (BID) | ORAL | Status: DC
Start: 2024-04-28 — End: 2024-04-27

## 2024-04-27 NOTE — Progress Notes (Signed)
 Discharge medications delivered to patient at the bedside in a secure

## 2024-04-27 NOTE — Discharge Summary (Addendum)
 Physician Discharge Summary  Christine Dixon FMW:[id number] DOB: 05/11/1946 DOA: 04/25/2024  PCP: Juliane Che, PA  Admit date: 04/25/2024 Discharge date: 04/27/2024  Admitted From: Home Disposition:  Home  Discharge Condition:Stable CODE STATUS:FULL Diet recommendation: Regular   Brief/Interim Summary: Patient is a 78 year old female with history of HIV disease on HAART, sickle cell trait, hypertension, Crohn's disease, CKD stage IIIa who presented with left flank pain, nausea, vomiting, low-grade fever.  Imaging showed 4 millimeter kidney stone on the right side with hydronephrosis.  UA suspicious for UTI.  Urology consulted.  Started on broad spectrum antibiotics.  Urine culture sent.  Status post cystoscopy, right ureteroscopy and stone removal, ureteroscopy laser lithotripsy and right ureteral stent placement on 10/29.  Urology recommend outpatient follow-up.  She is afebrile, leukocytosis has significantly improved.  Urine culture was sent but has not resulted yet.  She is eager to go home.  Antibiotics changed to oral.  Medically stable for discharge.  Following problems were addressed during the hospitalization:  Sepsis secondary to UTI: Met sepsis criteria with tachycardia, leukocytosis on presentation. Strated on ceftriaxone.  Presented with leukocytosis. Urology recommend outpatient follow-up.  She is afebrile, leukocytosis has significantly improved.  Urine culture was sent but has not resulted yet.  She is eager to go home.  Antibiotics changed to oral.     Right ureteral stone: Imaging showed 4 millimeter kidney stone on the right side with hydronephrosis. Urology consulted.  Started on broad antibiotics.  Urine culture sent.  Status post cystoscopy, right ureteroscopy and stone removal, ureteroscopy laser lithotripsy and right ureteral stent placement on 10/29.  Continue Flomax   HIV disease: Currently in remission.  Viral load was undetectable in September.CD4 cell count 839  with 4% CD4 helper T cells. Patient is on anti-HIV medicine. She will follow-up with infectious disease.    History of hepatitis: Also in remission   Crohn's disease: Appears in remission, continue to monitor   Hypertension: Currently blood stable.  Continue home regimen   Generalized anxiety disorder: Currently stable.  Continue home regimen   CKD stage IIIa: Baseline creatinine around 1.4.  Presented with mild AKI of 1.6.  Now kidney function back to baseline   Discharge Diagnoses:  Principal Problem:   Complicated UTI (urinary tract infection) Active Problems:   HIV DISEASE   HEPATITIS C   Essential hypertension   Chronic kidney disease, stage 3a (HCC)   GAD (generalized anxiety disorder)   Crohn's disease, small intestine (HCC)   Nephrolithiasis    Discharge Instructions  Discharge Instructions     Diet general   Complete by: As directed    Discharge instructions   Complete by: As directed    1)Please take your medications as instructed 2)Follow up with urology as an outpatient.  You will be called for appointment.   Increase activity slowly   Complete by: As directed       Allergies as of 04/27/2024       Reactions   Codeine Other (See Comments)   Change in mental status    Nsaids Other (See Comments)   Contraindication due to CKD stage 3         Medication List     TAKE these medications    alendronate 70 MG tablet Commonly known as: FOSAMAX Take 70 mg by mouth once a week.   amLODipine  10 MG tablet Commonly known as: NORVASC  Take one tablet by mouth daily.  **please contact md for more refills**   cefadroxil 500  MG capsule Commonly known as: DURICEF Take 1 capsule (500 mg total) by mouth 2 (two) times daily for 2 days. Start taking on: April 28, 2024   cholecalciferol 25 MCG (1000 UNIT) tablet Commonly known as: VITAMIN D3 Take 1,000 Units by mouth daily.   cyanocobalamin 500 MCG tablet Commonly known as: VITAMIN B12 Take 500 mcg  by mouth daily.   gemfibrozil 600 MG tablet Commonly known as: LOPID Take 600 mg by mouth 2 (two) times daily.   Juluca  50-25 MG tablet Generic drug: dolutegravir -rilpivirine  TAKE 1 TABLET BY MOUTH DAILY WITH A MEAL   multivitamin-iron-minerals-folic acid chewable tablet Chew 1 tablet by mouth daily. Reported on 09/09/2015   tamsulosin 0.4 MG Caps capsule Commonly known as: FLOMAX Take 1 capsule (0.4 mg total) by mouth daily. Start taking on: April 28, 2024   Vitamin D  (Ergocalciferol ) 1.25 MG (50000 UNIT) Caps capsule Commonly known as: DRISDOL  Take 50,000 Units by mouth once a week.         Follow-up Information     Claudene Waddell HERO, PA-C Follow up.   Why: you will be called to set up f/u in the next 8-10 weeks Contact information: [address] [place] [address] [phone]                Allergies  Allergen Reactions   Codeine Other (See Comments)    Change in mental status    Nsaids Other (See Comments)    Contraindication due to CKD stage 3     Consultations: Urology   Procedures/Studies: DG C-Arm 1-60 Min-No Report Result Date: 04/26/2024 Fluoroscopy was utilized by the requesting physician.  No radiographic interpretation.   CT ABDOMEN PELVIS W CONTRAST Result Date: 04/25/2024 CLINICAL DATA:  Right lower quadrant abdominal pain. EXAM: CT ABDOMEN AND PELVIS WITH CONTRAST TECHNIQUE: Multidetector CT imaging of the abdomen and pelvis was performed using the standard protocol following bolus administration of intravenous contrast. RADIATION DOSE REDUCTION: This exam was performed according to the departmental dose-optimization program which includes automated exposure control, adjustment of the mA and/or kV according to patient size and/or use of iterative reconstruction technique. CONTRAST:  70mL OMNIPAQUE IOHEXOL 300 MG/ML  SOLN COMPARISON:  None Available. FINDINGS: Lower chest: The visualized lung bases are clear. No intra-abdominal  free air or free fluid. Hepatobiliary: Cirrhosis. No biliary ductal dilatation. The gallbladder is distended. Stones noted within the gallbladder. No pericholecystic fluid or evidence of acute cholecystitis by CT. Pancreas: Unremarkable. No pancreatic ductal dilatation or surrounding inflammatory changes. Spleen: Normal in size without focal abnormality. Adrenals/Urinary Tract: The adrenal glands unremarkable. There is a 4 mm stone in the distal right ureter with mild right hydronephrosis. There is delayed enhancement and excretion of contrast by the right kidney. There is a 3 mm nonobstructing left renal interpolar calculus. No hydronephrosis on the left. The visualized left ureter and urinary bladder appear unremarkable. Stomach/Bowel: Small hiatal hernia. There is no bowel obstruction or active inflammation. The appendix is not visualized with certainty. No inflammatory changes identified in the right lower quadrant. Vascular/Lymphatic: Mild aortoiliac atherosclerotic disease. The IVC is unremarkable. No portal venous gas. There is no adenopathy. Reproductive: The uterus is anteverted. No suspicious adnexal masses. Other: None Musculoskeletal: No acute or significant osseous findings. IMPRESSION: 1. A 4 mm distal right ureteral stone with mild right hydronephrosis. 2. A 3 mm nonobstructing left renal interpolar calculus. 3. Cirrhosis. 4. Cholelithiasis. 5. No bowel obstruction. 6.  Aortic Atherosclerosis (ICD10-I70.0). Electronically Signed  By: Vanetta Chou M.D.   On: 04/25/2024 18:09      Subjective: Patient seen and examined at bedside today.  Hemodynamically stable.  No new complaints.  No abdomen pain, nausea or vomiting.  Afebrile.  Leukocytosis improved.  No dysuria.  Very eager to go home today.  Medically stable for discharge  Discharge Exam: Vitals:   04/26/24 2355 04/27/24 0526  BP: 132/62 121/60  Pulse: 91 74  Resp: 18 20  Temp:  98.8 F (37.1 C)  SpO2: 98% 97%   Vitals:    04/26/24 2308 04/26/24 2313 04/26/24 2355 04/27/24 0526  BP: (!) 163/67 (!) 155/64 132/62 121/60  Pulse: 87 85 91 74  Resp: 20 20 18 20   Temp:  98.3 F (36.8 C)  98.8 F (37.1 C)  TempSrc:      SpO2: 100% 100% 98% 97%  Weight:      Height:        General: Pt is alert, awake, not in acute distress Cardiovascular: RRR, S1/S2 +, no rubs, no gallops Respiratory: CTA bilaterally, no wheezing, no rhonchi Abdominal: Soft, NT, ND, bowel sounds + Extremities: no edema, no cyanosis    The results of significant diagnostics from this hospitalization (including imaging, microbiology, ancillary and laboratory) are listed below for reference.     Microbiology: Recent Results (from the past 240 hours)  Surgical PCR screen     Status: None   Collection Time: 04/25/24 10:44 PM   Specimen: Nasal Mucosa; Nasal Swab  Result Value Ref Range Status   MRSA, PCR NEGATIVE NEGATIVE Final   Staphylococcus aureus NEGATIVE NEGATIVE Final    Comment: (NOTE) The Xpert SA Assay (FDA approved for NASAL specimens in patients 74 years of age and older), is one component of a comprehensive surveillance program. It is not intended to diagnose infection nor to guide or monitor treatment. Performed at [facility], [address] [place], [place] [address]   Urine Culture (for pregnant, neutropenic or urologic patients or patients with an indwelling urinary catheter)     Status: None   Collection Time: 04/26/24  9:45 AM   Specimen: Urine, Clean Catch  Result Value Ref Range Status   Specimen Description   Final    URINE, CLEAN CATCH Performed at [facility], [address], [place], [place] [address]    Special Requests   Final    NONE Performed at [facility], [address] [place], [place] [address]    Culture   Final    NO GROWTH Performed at [facility], [address] [place], [place] [address]    Report Status 04/27/2024  FINAL  Final     Labs: BNP (last 3 results) No results for input(s): BNP in the last 8760 hours. Basic Metabolic Panel: Recent Labs  Lab 04/25/24 1618 04/26/24 0920  NA 140 141  K 3.8 3.7  CL 104 106  CO2 22 22  GLUCOSE 104* 117*  BUN 18 14  CREATININE 1.60* 1.44*  CALCIUM 9.7 8.8*   Liver Function Tests: Recent Labs  Lab 04/25/24 1618 04/26/24 0920  AST 30 24  ALT 22 17  ALKPHOS 51 48  BILITOT 0.4 0.4  PROT 8.0 7.2  ALBUMIN 4.7 4.3   Recent Labs  Lab 04/25/24 1618  LIPASE 29   No results for input(s): AMMONIA in the last 168 hours. CBC: Recent Labs  Lab 04/25/24 1618 04/26/24 0920  WBC 19.9* 13.2*  NEUTROABS 17.0*  --  HGB 12.8 11.4*  HCT 39.4 36.8  MCV 83.8 85.2  PLT 326 302   Cardiac Enzymes: No results for input(s): CKTOTAL, CKMB, CKMBINDEX, TROPONINI in the last 168 hours. BNP: Invalid input(s): POCBNP CBG: No results for input(s): GLUCAP in the last 168 hours. D-Dimer No results for input(s): DDIMER in the last 72 hours. Hgb A1c No results for input(s): HGBA1C in the last 72 hours. Lipid Profile No results for input(s): CHOL, HDL, LDLCALC, TRIG, CHOLHDL, LDLDIRECT in the last 72 hours. Thyroid  function studies No results for input(s): TSH, T4TOTAL, T3FREE, THYROIDAB in the last 72 hours.  Invalid input(s): FREET3 Anemia work up No results for input(s): VITAMINB12, FOLATE, FERRITIN, TIBC, IRON, RETICCTPCT in the last 72 hours. Urinalysis    Component Value Date/Time   COLORURINE YELLOW 04/25/2024 1612   APPEARANCEUR CLEAR 04/25/2024 1612   LABSPEC 1.016 04/25/2024 1612   PHURINE 5.5 04/25/2024 1612   GLUCOSEU NEGATIVE 04/25/2024 1612   GLUCOSEU NEG mg/dL 95/69/7991 9999   HGBUR LARGE (A) 04/25/2024 1612   BILIRUBINUR NEGATIVE 04/25/2024 1612   KETONESUR NEGATIVE 04/25/2024 1612   PROTEINUR 30 (A) 04/25/2024 1612   UROBILINOGEN 0.2 04/11/2013 1450   NITRITE NEGATIVE 04/25/2024  1612   LEUKOCYTESUR TRACE (A) 04/25/2024 1612   Sepsis Labs Recent Labs  Lab 04/25/24 1618 04/26/24 0920  WBC 19.9* 13.2*   Microbiology Recent Results (from the past 240 hours)  Surgical PCR screen     Status: None   Collection Time: 04/25/24 10:44 PM   Specimen: Nasal Mucosa; Nasal Swab  Result Value Ref Range Status   MRSA, PCR NEGATIVE NEGATIVE Final   Staphylococcus aureus NEGATIVE NEGATIVE Final    Comment: (NOTE) The Xpert SA Assay (FDA approved for NASAL specimens in patients 46 years of age and older), is one component of a comprehensive surveillance program. It is not intended to diagnose infection nor to guide or monitor treatment. Performed at [facility], [address], [place], [place] [address]   Urine Culture (for pregnant, neutropenic or urologic patients or patients with an indwelling urinary catheter)     Status: None   Collection Time: 04/26/24  9:45 AM   Specimen: Urine, Clean Catch  Result Value Ref Range Status   Specimen Description   Final    URINE, CLEAN CATCH Performed at [facility], [address] [place], [place] [address]    Special Requests   Final    NONE Performed at [facility], [address], [place], [place] [address]    Culture   Final    NO GROWTH Performed at [facility], [address], [place], [place] [address]    Report Status 04/27/2024 FINAL  Final    Please note: You were cared for by a hospitalist during your hospital stay. Once you are discharged, your primary care physician will handle any further medical issues. Please note that NO REFILLS for any discharge medications will be authorized once you are discharged, as it is imperative that you return to your primary care physician (or establish a relationship with a primary care physician if you do not have one) for your post hospital discharge needs so that they can reassess your need for medications and  monitor your lab values.    Time coordinating discharge: 40 minutes  SIGNED:   Ivonne Mustache, MD  [facility] 04/27/2024, 11:32 AM Pager [phone]  If 7PM-7AM, please contact night-coverage [email or web address] Password [email or web address]

## 2024-04-27 NOTE — Progress Notes (Signed)
 1 Day Post-Op Subjective: Doing well, no issues.   Objective: Vital signs in last 24 hours: Temp:  [98.1 F (36.7 C)-98.8 F (37.1 C)] 98.8 F (37.1 C) (10/30 0526) Pulse Rate:  [74-91] 74 (10/30 0526) Resp:  [15-20] 20 (10/30 0526) BP: (121-163)/(58-67) 121/60 (10/30 0526) SpO2:  [97 %-100 %] 97 % (10/30 0526)  Intake/Output from previous day: 10/29 0701 - 10/30 0700 In: 100 [I.V.:100] Out: 0  Intake/Output this shift: No intake/output data recorded.  Physical Exam:  General: Alert and oriented CV: RRR Lungs: Clear Ext: NT, No erythema  Lab Results: Recent Labs    04/25/24 1618 04/26/24 0920  HGB 12.8 11.4*  HCT 39.4 36.8   BMET Recent Labs    04/25/24 1618 04/26/24 0920  NA 140 141  K 3.8 3.7  CL 104 106  CO2 22 22  GLUCOSE 104* 117*  BUN 18 14  CREATININE 1.60* 1.44*  CALCIUM 9.7 8.8*     Studies/Results: DG C-Arm 1-60 Min-No Report Result Date: 04/26/2024 Fluoroscopy was utilized by the requesting physician.  No radiographic interpretation.   CT ABDOMEN PELVIS W CONTRAST Result Date: 04/25/2024 CLINICAL DATA:  Right lower quadrant abdominal pain. EXAM: CT ABDOMEN AND PELVIS WITH CONTRAST TECHNIQUE: Multidetector CT imaging of the abdomen and pelvis was performed using the standard protocol following bolus administration of intravenous contrast. RADIATION DOSE REDUCTION: This exam was performed according to the departmental dose-optimization program which includes automated exposure control, adjustment of the mA and/or kV according to patient size and/or use of iterative reconstruction technique. CONTRAST:  70mL OMNIPAQUE IOHEXOL 300 MG/ML  SOLN COMPARISON:  None Available. FINDINGS: Lower chest: The visualized lung bases are clear. No intra-abdominal free air or free fluid. Hepatobiliary: Cirrhosis. No biliary ductal dilatation. The gallbladder is distended. Stones noted within the gallbladder. No pericholecystic fluid or evidence of acute cholecystitis  by CT. Pancreas: Unremarkable. No pancreatic ductal dilatation or surrounding inflammatory changes. Spleen: Normal in size without focal abnormality. Adrenals/Urinary Tract: The adrenal glands unremarkable. There is a 4 mm stone in the distal right ureter with mild right hydronephrosis. There is delayed enhancement and excretion of contrast by the right kidney. There is a 3 mm nonobstructing left renal interpolar calculus. No hydronephrosis on the left. The visualized left ureter and urinary bladder appear unremarkable. Stomach/Bowel: Small hiatal hernia. There is no bowel obstruction or active inflammation. The appendix is not visualized with certainty. No inflammatory changes identified in the right lower quadrant. Vascular/Lymphatic: Mild aortoiliac atherosclerotic disease. The IVC is unremarkable. No portal venous gas. There is no adenopathy. Reproductive: The uterus is anteverted. No suspicious adnexal masses. Other: None Musculoskeletal: No acute or significant osseous findings. IMPRESSION: 1. A 4 mm distal right ureteral stone with mild right hydronephrosis. 2. A 3 mm nonobstructing left renal interpolar calculus. 3. Cirrhosis. 4. Cholelithiasis. 5. No bowel obstruction. 6.  Aortic Atherosclerosis (ICD10-I70.0). Electronically Signed   By: Vanetta Chou M.D.   On: 04/25/2024 18:09    Assessment/Plan: 56 F w/ hx of R ureteral stone s/p R URS w/ ll.  Post op - ok to DC - DC instructions have stent removal instructions - DC on flomax - urology will set up f/u in 8-10 weeks  - urology to sign off.    LOS: 1 day   Jackey Pea MD 04/27/2024, 9:41 AM [facility]

## 2024-04-28 NOTE — Anesthesia Postprocedure Evaluation (Signed)
 Anesthesia Post Note  Patient: Christine Dixon  Procedure(s) Performed: CYSTOSCOPY, URETEROSCOPY, RETROGRADE PYELOGRAM, STENT INSERTION (Right)     Patient location during evaluation: PACU Anesthesia Type: General Level of consciousness: awake and alert Pain management: pain level controlled Vital Signs Assessment: post-procedure vital signs reviewed and stable Respiratory status: spontaneous breathing, nonlabored ventilation, respiratory function stable and patient connected to nasal cannula oxygen Cardiovascular status: blood pressure returned to baseline and stable Postop Assessment: no apparent nausea or vomiting Anesthetic complications: no   No notable events documented.  Last Vitals:  Vitals:   04/26/24 2355 04/27/24 0526  BP: 132/62 121/60  Pulse: 91 74  Resp: 18 20  Temp:  37.1 C  SpO2: 98% 97%    Last Pain:  Vitals:   04/27/24 0730  TempSrc:   PainSc: 0-No pain                 Epifanio Lamar BRAVO

## 2024-05-08 LAB — STONE ANALYSIS
Calcium Oxalate Monohydrate: 100 %
Weight Calculi: 16 mg

## 2024-06-23 ENCOUNTER — Other Ambulatory Visit (HOSPITAL_COMMUNITY): Payer: Self-pay

## 2024-07-03 ENCOUNTER — Other Ambulatory Visit: Payer: Self-pay

## 2024-07-03 DIAGNOSIS — B20 Human immunodeficiency virus [HIV] disease: Secondary | ICD-10-CM

## 2024-07-06 ENCOUNTER — Other Ambulatory Visit

## 2024-07-10 ENCOUNTER — Other Ambulatory Visit

## 2024-07-10 ENCOUNTER — Other Ambulatory Visit: Payer: Self-pay

## 2024-07-10 DIAGNOSIS — B20 Human immunodeficiency virus [HIV] disease: Secondary | ICD-10-CM

## 2024-07-11 LAB — T-HELPER CELL (CD4) - (RCID CLINIC ONLY)
CD4 % Helper T Cell: 34 % (ref 33–65)
CD4 T Cell Abs: 902 /uL (ref 400–1790)

## 2024-07-12 LAB — COMPLETE METABOLIC PANEL WITHOUT GFR
AG Ratio: 1.7 (calc) (ref 1.0–2.5)
ALT: 12 U/L (ref 6–29)
AST: 14 U/L (ref 10–35)
Albumin: 4.5 g/dL (ref 3.6–5.1)
Alkaline phosphatase (APISO): 47 U/L (ref 37–153)
BUN/Creatinine Ratio: 10 (calc) (ref 6–22)
BUN: 13 mg/dL (ref 7–25)
CO2: 25 mmol/L (ref 20–32)
Calcium: 9.5 mg/dL (ref 8.6–10.4)
Chloride: 106 mmol/L (ref 98–110)
Creat: 1.31 mg/dL — ABNORMAL HIGH (ref 0.60–1.00)
Globulin: 2.7 g/dL (ref 1.9–3.7)
Glucose, Bld: 101 mg/dL — ABNORMAL HIGH (ref 65–99)
Potassium: 3.9 mmol/L (ref 3.5–5.3)
Sodium: 139 mmol/L (ref 135–146)
Total Bilirubin: 0.3 mg/dL (ref 0.2–1.2)
Total Protein: 7.2 g/dL (ref 6.1–8.1)

## 2024-07-12 LAB — CBC WITH DIFFERENTIAL/PLATELET
Absolute Lymphocytes: 2761 {cells}/uL (ref 850–3900)
Absolute Monocytes: 904 {cells}/uL (ref 200–950)
Basophils Absolute: 60 {cells}/uL (ref 0–200)
Basophils Relative: 0.5 %
Eosinophils Absolute: 179 {cells}/uL (ref 15–500)
Eosinophils Relative: 1.5 %
HCT: 37.4 % (ref 35.9–46.0)
Hemoglobin: 12 g/dL (ref 11.7–15.5)
MCH: 27.3 pg (ref 27.0–33.0)
MCHC: 32.1 g/dL (ref 31.6–35.4)
MCV: 85.2 fL (ref 81.4–101.7)
MPV: 11.9 fL (ref 7.5–12.5)
Monocytes Relative: 7.6 %
Neutro Abs: 7997 {cells}/uL — ABNORMAL HIGH (ref 1500–7800)
Neutrophils Relative %: 67.2 %
Platelets: 330 Thousand/uL (ref 140–400)
RBC: 4.39 Million/uL (ref 3.80–5.10)
RDW: 13.2 % (ref 11.0–15.0)
Total Lymphocyte: 23.2 %
WBC: 11.9 Thousand/uL — ABNORMAL HIGH (ref 3.8–10.8)

## 2024-07-12 LAB — HIV-1 RNA QUANT-NO REFLEX-BLD
HIV 1 RNA Quant: NOT DETECTED {copies}/mL
HIV-1 RNA Quant, Log: NOT DETECTED {Log_copies}/mL

## 2024-07-14 ENCOUNTER — Other Ambulatory Visit: Payer: Self-pay | Admitting: Internal Medicine

## 2024-07-14 DIAGNOSIS — B20 Human immunodeficiency virus [HIV] disease: Secondary | ICD-10-CM

## 2024-07-20 ENCOUNTER — Ambulatory Visit: Payer: Self-pay | Admitting: Internal Medicine

## 2024-07-27 ENCOUNTER — Ambulatory Visit: Admitting: Internal Medicine

## 2024-08-03 ENCOUNTER — Ambulatory Visit: Admitting: Internal Medicine

## 2024-08-03 ENCOUNTER — Other Ambulatory Visit: Payer: Self-pay

## 2024-08-03 ENCOUNTER — Encounter: Payer: Self-pay | Admitting: Internal Medicine

## 2024-08-03 DIAGNOSIS — B20 Human immunodeficiency virus [HIV] disease: Secondary | ICD-10-CM

## 2024-08-03 MED ORDER — JULUCA 50-25 MG PO TABS: ORAL_TABLET | ORAL | 3 refills | Status: AC

## 2024-08-03 NOTE — Progress Notes (Unsigned)
 "      Patient ID: Christine Dixon, female   DOB: 1946/04/22, 79 y.o.   MRN: [id number]  HPI Christine Dixon is a 79yo F with hiv disease on juluca   Outpatient Encounter Medications as of 08/03/2024  Medication Sig   alendronate (FOSAMAX) 70 MG tablet Take 70 mg by mouth once a week.   amLODipine  (NORVASC ) 10 MG tablet Take one tablet by mouth daily.  **please contact md for more refills**   cholecalciferol (VITAMIN D3) 25 MCG (1000 UNIT) tablet Take 1,000 Units by mouth daily.   dolutegravir -rilpivirine  (JULUCA ) 50-25 MG tablet TAKE 1 TABLET BY MOUTH DAILY WITH A MEAL   multivitamin-iron-minerals-folic acid (CENTRUM) chewable tablet Chew 1 tablet by mouth daily. Reported on 09/09/2015   tamsulosin  (FLOMAX ) 0.4 MG CAPS capsule Take 1 capsule (0.4 mg total) by mouth daily.   vitamin B-12 (CYANOCOBALAMIN ) 500 MCG tablet Take 500 mcg by mouth daily.   Vitamin D , Ergocalciferol , (DRISDOL ) 1.25 MG (50000 UNIT) CAPS capsule Take 50,000 Units by mouth once a week.   gemfibrozil  (LOPID ) 600 MG tablet Take 600 mg by mouth 2 (two) times daily. (Patient not taking: Reported on 08/03/2024)   No facility-administered encounter medications on file as of 08/03/2024.     Patient Active Problem List   Diagnosis Date Noted   Complicated UTI (urinary tract infection) 04/25/2024   Nephrolithiasis 04/25/2024   Chronic kidney disease, stage 3a (HCC) 06/04/2020   GAD (generalized anxiety disorder) 12/29/2018   Panic disorder 10/05/2018   Essential hypertension 12/16/2017   Weakness 10/16/2017   Atypical chest pain 10/16/2017   Palpitations 10/16/2017   Tachycardia 10/16/2017   Papanicolaou smear of cervix with low grade squamous intraepithelial lesion (LGSIL) 10/08/2012   CROHN'S DISEASE, SMALL INTESTINE 03/25/2010   Crohn's disease, small intestine (HCC) 03/25/2010   HIV DISEASE 07/22/2006   HEPATITIS C 07/22/2006   ANEMIA OF CHRONIC DISEASE 07/22/2006   DYSPLASIA, CERVIX, MILD 07/22/2006   DYSPLASIA, VAGINA  07/22/2006   Osteoporosis 07/22/2006   Dysplasia, 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   Anemia of chronic disease 07/22/2006   HIV disease (HCC) 07/22/2006   Osteoporosis 07/22/2006     Health Maintenance Due  Topic Date Due   DTaP/Tdap/Td (1 - Tdap) Never done   Medicare Annual Wellness (AWV)  06/01/2024     Review of Systems  Physical Exam   BP (!) 190/81   Pulse (!) 103   Temp 98.3 F (36.8 C) (Oral)   Wt 97 lb 12.8 oz (44.4 kg)   SpO2 99%   BMI 19.75 kg/m    Lab Results  Component Value Date   CD4TCELL 34 07/10/2024   Lab Results  Component Value Date   CD4TABS 902 07/10/2024   CD4TABS 839 03/09/2024   CD4TABS 910 09/08/2023   Lab Results  Component Value Date   HIV1RNAQUANT NOT DETECTED 07/10/2024   Lab Results  Component Value Date   HEPBSAB POS (A) 06/17/2015   Lab Results  Component Value Date   LABRPR NON-REACTIVE 03/09/2024    CBC Lab Results  Component Value Date   WBC 11.9 (H) 07/10/2024   RBC 4.39 07/10/2024   HGB 12.0 07/10/2024   HCT 37.4 07/10/2024   PLT 330 07/10/2024   MCV 85.2 07/10/2024   MCH 27.3 07/10/2024   MCHC 32.1 07/10/2024   RDW 13.2 07/10/2024   LYMPHSABS 1.3 04/25/2024   MONOABS 1.4 (H) 04/25/2024   EOSABS 179 07/10/2024    BMET Lab Results  Component Value Date   NA  139 07/10/2024   K 3.9 07/10/2024   CL 106 07/10/2024   CO2 25 07/10/2024   GLUCOSE 101 (H) 07/10/2024   BUN 13 07/10/2024   CREATININE 1.31 (H) 07/10/2024   CALCIUM 9.5 07/10/2024   GFRNONAA 37 (L) 04/26/2024   GFRAA 44 (L) 10/02/2020      Assessment and Plan     "

## 2025-01-18 ENCOUNTER — Other Ambulatory Visit: Payer: Self-pay

## 2025-02-01 ENCOUNTER — Ambulatory Visit: Payer: Self-pay | Admitting: Internal Medicine
